# Patient Record
Sex: Male | Born: 1935 | Race: White | Hispanic: No | Smoking: Never smoker
Health system: Southern US, Community
[De-identification: ages and names within clinical notes are randomized; demographics above are authoritative.]

## PROBLEM LIST (undated history)

## (undated) DIAGNOSIS — F419 Anxiety disorder, unspecified: Secondary | ICD-10-CM

## (undated) DIAGNOSIS — F32A Depression, unspecified: Secondary | ICD-10-CM

## (undated) DIAGNOSIS — F329 Major depressive disorder, single episode, unspecified: Secondary | ICD-10-CM

## (undated) DIAGNOSIS — I10 Essential (primary) hypertension: Secondary | ICD-10-CM

## (undated) DIAGNOSIS — I2510 Atherosclerotic heart disease of native coronary artery without angina pectoris: Secondary | ICD-10-CM

## (undated) DIAGNOSIS — M1990 Unspecified osteoarthritis, unspecified site: Secondary | ICD-10-CM

## (undated) DIAGNOSIS — I5022 Chronic systolic (congestive) heart failure: Secondary | ICD-10-CM

## (undated) DIAGNOSIS — N189 Chronic kidney disease, unspecified: Secondary | ICD-10-CM

## (undated) HISTORY — PX: HERNIA REPAIR: SHX51

## (undated) HISTORY — PX: JOINT REPLACEMENT: SHX530

## (undated) HISTORY — PX: APPENDECTOMY: SHX54

---
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
## 2007-02-05 ENCOUNTER — Ambulatory Visit: Payer: Self-pay | Admitting: Gastroenterology

## 2010-08-03 ENCOUNTER — Ambulatory Visit: Payer: Self-pay | Admitting: General Practice

## 2010-08-03 DIAGNOSIS — I10 Essential (primary) hypertension: Secondary | ICD-10-CM

## 2010-08-10 ENCOUNTER — Ambulatory Visit: Payer: Self-pay | Admitting: General Practice

## 2012-02-20 ENCOUNTER — Ambulatory Visit: Payer: Self-pay | Admitting: General Practice

## 2012-02-20 LAB — CBC
HCT: 37.4 % — ABNORMAL LOW (ref 40.0–52.0)
HGB: 12.7 g/dL — ABNORMAL LOW (ref 13.0–18.0)
MCHC: 34.1 g/dL (ref 32.0–36.0)
MCV: 100 fL (ref 80–100)
RDW: 14.5 % (ref 11.5–14.5)
WBC: 9 10*3/uL (ref 3.8–10.6)

## 2012-02-20 LAB — APTT: Activated PTT: 29 secs (ref 23.6–35.9)

## 2012-02-20 LAB — BASIC METABOLIC PANEL
Anion Gap: 5 — ABNORMAL LOW (ref 7–16)
Calcium, Total: 9.6 mg/dL (ref 8.5–10.1)
Co2: 31 mmol/L (ref 21–32)
EGFR (African American): 57 — ABNORMAL LOW
EGFR (Non-African Amer.): 49 — ABNORMAL LOW
Glucose: 89 mg/dL (ref 65–99)
Osmolality: 282 (ref 275–301)

## 2012-02-20 LAB — PROTIME-INR
INR: 1
Prothrombin Time: 13.4 secs (ref 11.5–14.7)

## 2012-02-20 LAB — URINALYSIS, COMPLETE
Bilirubin,UR: NEGATIVE
Glucose,UR: NEGATIVE mg/dL (ref 0–75)
Ketone: NEGATIVE
RBC,UR: NONE SEEN /HPF (ref 0–5)
Specific Gravity: 1.013 (ref 1.003–1.030)
Squamous Epithelial: <1
WBC UR: 1 /HPF (ref 0–5)

## 2012-02-20 LAB — MRSA PCR SCREENING

## 2012-02-22 LAB — URINE CULTURE

## 2012-03-04 ENCOUNTER — Inpatient Hospital Stay: Payer: Self-pay | Admitting: General Practice

## 2012-03-05 LAB — BASIC METABOLIC PANEL
Anion Gap: 8 (ref 7–16)
BUN: 21 mg/dL — ABNORMAL HIGH (ref 7–18)
Calcium, Total: 8.4 mg/dL — ABNORMAL LOW (ref 8.5–10.1)
Chloride: 104 mmol/L (ref 98–107)
Co2: 27 mmol/L (ref 21–32)
Creatinine: 1.25 mg/dL (ref 0.60–1.30)
EGFR (African American): >60
EGFR (Non-African Amer.): 56 — ABNORMAL LOW
Osmolality: 282 (ref 275–301)
Potassium: 4.2 mmol/L (ref 3.5–5.1)

## 2012-03-05 LAB — HEMOGLOBIN: HGB: 11.2 g/dL — ABNORMAL LOW (ref 13.0–18.0)

## 2012-03-06 LAB — PLATELET COUNT: Platelet: 107 10*3/uL — ABNORMAL LOW (ref 150–440)

## 2012-03-06 LAB — BASIC METABOLIC PANEL
Anion Gap: 6 — ABNORMAL LOW (ref 7–16)
BUN: 16 mg/dL (ref 7–18)
Calcium, Total: 8.3 mg/dL — ABNORMAL LOW (ref 8.5–10.1)
Chloride: 103 mmol/L (ref 98–107)
Co2: 29 mmol/L (ref 21–32)
Creatinine: 1.29 mg/dL (ref 0.60–1.30)
EGFR (African American): >60
EGFR (Non-African Amer.): 54 — ABNORMAL LOW
Potassium: 4.3 mmol/L (ref 3.5–5.1)
Sodium: 138 mmol/L (ref 136–145)

## 2012-03-06 LAB — HEMOGLOBIN: HGB: 10.9 g/dL — ABNORMAL LOW (ref 13.0–18.0)

## 2012-03-08 ENCOUNTER — Encounter: Payer: Self-pay | Admitting: Internal Medicine

## 2012-03-19 ENCOUNTER — Encounter: Payer: Self-pay | Admitting: Internal Medicine

## 2013-08-29 IMAGING — CR DG KNEE 1-2V*R*
1 series · 2 of 2 positions shown · non-contrast
Comparison: none

REASON FOR EXAM: postop
COMMENTS:   Bedside (portable):Y

PROCEDURE:     DXR - DXR KNEE RIGHT AP AND LATERAL  - March 04, 2012  [DATE]
RESULT:     Right knee evaluation 03/04/2012.

[Series 1: ap · 0.17mm/px · 2 of 2 slices shown]
[im 1/2]
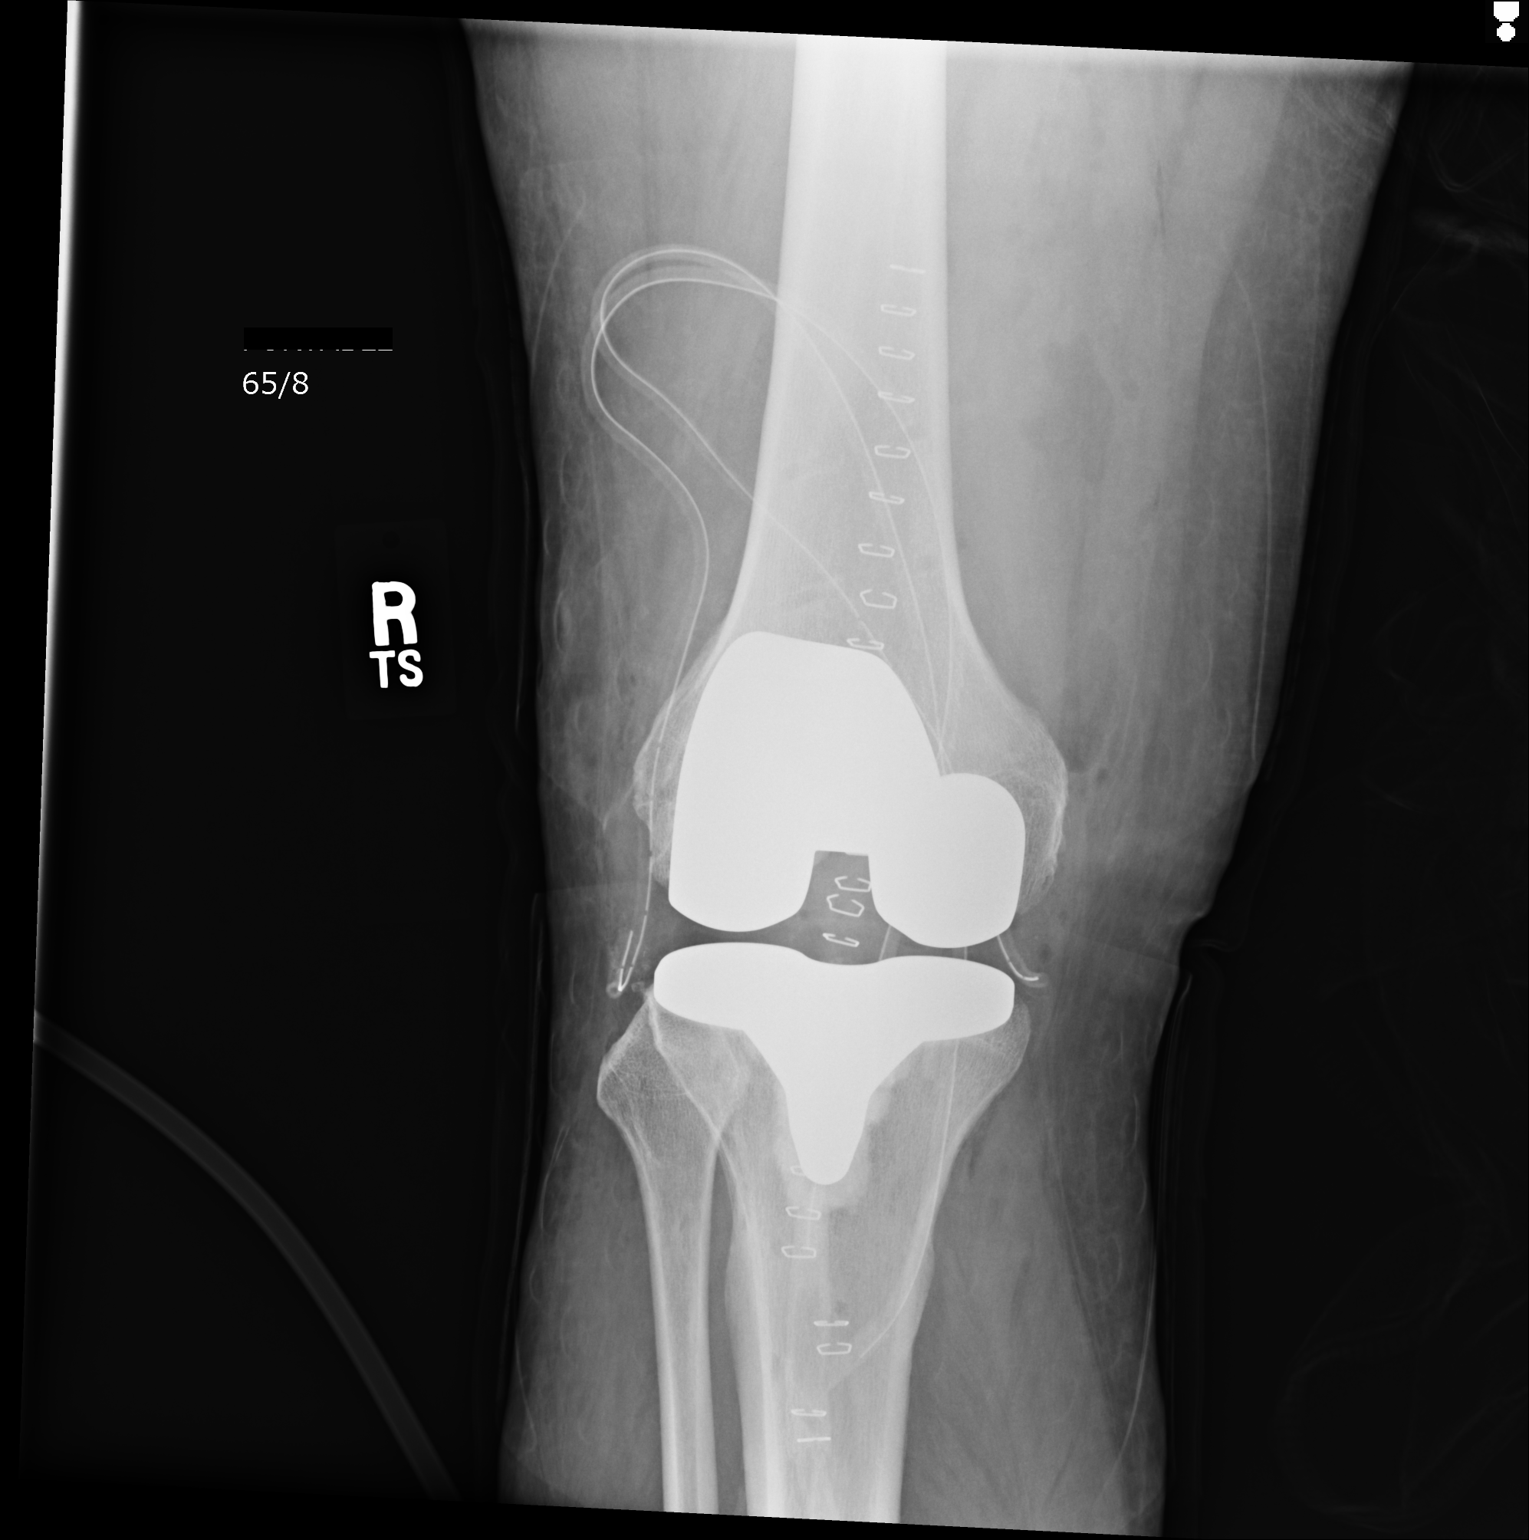
[im 2/2]
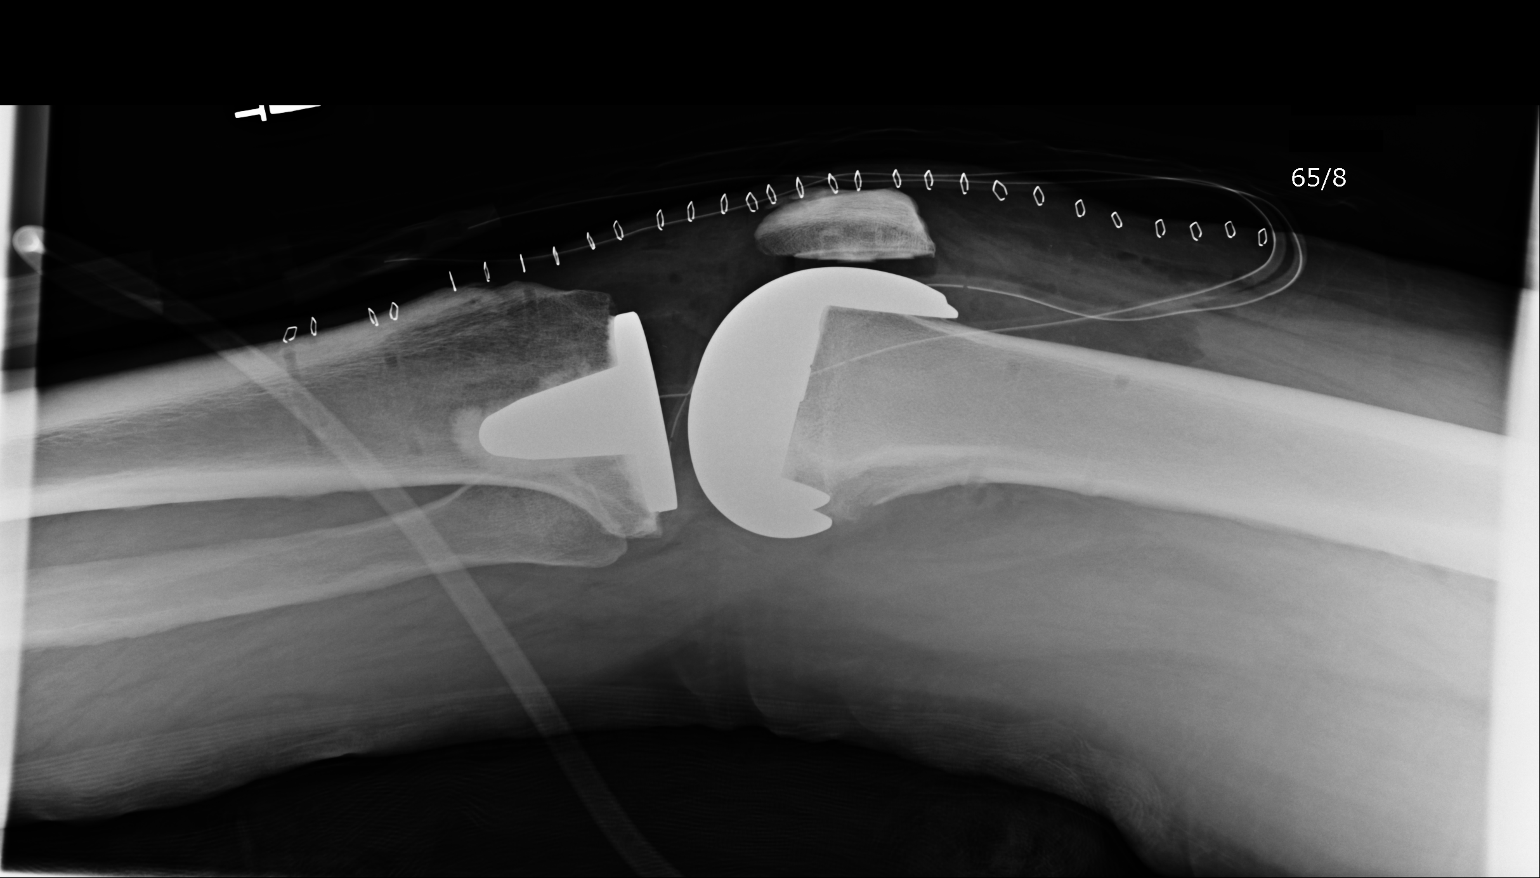

[2 of 2 positions shown; findings below may reference images not displayed]

FINDINGS: The patient status post total right knee replacement. The hardware
appears intact without evidence of loosening or failure. Skin staples
identified about the knee and surgical drains have been placed. The native
osseous structures are grossly unremarkable.
IMPRESSION: Patient status post total right knee replacement the
remainder of the interpretation on the left to the performing physician.

## 2014-01-12 DIAGNOSIS — M109 Gout, unspecified: Secondary | ICD-10-CM | POA: Insufficient documentation

## 2014-01-12 DIAGNOSIS — I10 Essential (primary) hypertension: Secondary | ICD-10-CM | POA: Insufficient documentation

## 2014-10-06 NOTE — Op Note (Signed)
PATIENT NAME:  Rutherford NailCURTIS, Jerome [name] MR#:  [id number] DATE OF BIRTH:  1935-02-21  DATE OF PROCEDURE:  03/04/2012  PREOPERATIVE DIAGNOSIS: Degenerative arthrosis of the right knee.   POSTOPERATIVE DIAGNOSIS: Degenerative arthrosis of the right knee.   PROCEDURE PERFORMED: Right total knee arthroplasty using computer-assisted navigation.   SURGEON: Illene LabradorJames P. Hooten, M.D.   ASSISTANT: Van ClinesJon Wolfe, PA-[name] (required to maintain retraction throughout the procedure)   ANESTHESIA: Spinal.   ESTIMATED BLOOD LOSS: 200 mL.   FLUIDS REPLACED: 1800 mL of crystalloid.   TOURNIQUET TIME: 100 minutes.   DRAINS: Two medium drains to reinfusion system.   SOFT TISSUE RELEASES: Anterior cruciate ligament, posterior cruciate ligament, deep medial collateral ligament, patellofemoral ligament, posterolateral corner, and pie crusting of iliotibial band.   IMPLANTS UTILIZED: DePuy PFC Sigma size 4 posterior stabilized femoral component (cemented), size 4 MBT tibial component (cemented), 38 mm three peg oval dome patella (cemented), and a 10 mm stabilized rotating platform polyethylene insert.   INDICATIONS FOR SURGERY: The patient is a 80 year old male who has been seen for complaints of progressive right knee pain. X-rays demonstrated significant degenerative changes with relative valgus deformity. After discussion of the risks and benefits of surgical intervention, the patient expressed his understanding of the risks and benefits and agreed with plans for surgical intervention.   PROCEDURE IN DETAIL: The patient was brought into the Operating Room and, after adequate spinal anesthesia was achieved, a tourniquet was placed on the patient's upper right thigh. The patient's right knee and leg were cleaned and prepped with alcohol and DuraPrep and draped in the usual sterile fashion. A "time out" was performed as per usual protocol. The right lower extremity was exsanguinated using an Esmarch, and the tourniquet was inflated  to 300 mmHg. An anterior longitudinal incision was made followed by a standard mid vastus approach. A large effusion was evacuated. The deep fibers of the medial collateral ligament were elevated in subperiosteal fashion off the medial flare of the tibia so as to maintain a continuous soft tissue sleeve. The patella was subluxed laterally and the patellofemoral ligament was incised. Inspection of the knee demonstrated significant degenerative changes in tricompartmental fashion with full thickness loss of the articular cartilage to the lateral tibial plateau. Anterior and posterior cruciate ligaments were excised. Osteophytes were debrided using a rongeur. Two 4.0 mm Schanz pins were inserted into the femur and into the tibia for attachment of the array of trackers used for computer-assisted navigation. Hip center was identified using a circumduction technique. Distal landmarks were mapped using the computer. The distal femur and proximal tibia were mapped using the computer. Distal femoral cutting guide was positioned using computer-assisted navigation so as to achieve 5 degree distal valgus cut. Cut was performed and verified using the computer. The distal femur was sized and it was felt that a size 4 femoral component was appropriate. A size 4 cutting guide was positioned and the anterior cut was performed and verified using the computer. This was followed by completion of the posterior and chamfer cuts. Femoral cutting guide for the central box was then positioned and the central box cut was performed.   Attention was then directed to the proximal tibia. Medial and lateral menisci were excised. The extramedullary tibial cutting guide was positioned using computer-assisted navigation so as to achieve 0 degree varus valgus alignment and 0 degree posterior slope. Cut was performed and verified using the computer. The proximal tibia was sized and it was felt that a size 4 tibial tray  was appropriate. Tibial and  femoral trials were inserted followed by insertion of a 10 mm polyethylene insert. The knee was felt to be tight laterally. Polyethylene trial was removed. Posterior osteophytes were debrided from the femoral condyles. Femoral and tibial trials were removed and the knee was placed in extension and distracted using the Moreland retractors. The posterolateral corner was noted be tight and it was carefully released using a combination of electrocautery and Metzenbaum scissors. Trial reduction was again performed with a 10 mm polyethylene insert. IT band was noted to be still tight and pie crusting was performed using a scalpel. This allowed for excellent mediolateral soft tissue balancing. Finally, the patella was cut and prepared so as to accommodate a 38 mm three peg oval dome patella. Patellar trial was placed and the knee was placed through a range of motion with excellent patellar tracking appreciated.   Femoral trial was removed. Central post hole for the tibial component was reamed followed by insertion of a keel punch. Tibial trial was then removed. The cut surfaces of bone were irrigated with copious amounts of normal saline with antibiotic solution using pulsatile lavage and then suctioned dry. Polymethyl methacrylate cement with gentamicin was mixed in the usual fashion using a vacuum mixer. Cement was applied to the cut surface of the proximal tibia as well as along the undersurface of a size 4 MBT tibial component. The tibial component was positioned and impacted into place. Excess cement was removed using freer elevators. Cement was then applied to the cut surfaces of the femur as well as along the posterior flanges of a size 4 posterior stabilized femoral component. Femoral component was positioned and impacted into place. Excess cement was removed using freer elevators. A 10 mm polyethylene trial was inserted and the knee was brought into full extension with steady axial compression applied. Finally,  cement was applied to the backside of a 38 mm three peg oval dome patella and the patellar component was positioned and patellar clamp applied. Excess cement was removed using freer elevators.   After adequate curing of cement, the tourniquet was deflated after total tourniquet time of 100 minutes. Hemostasis was achieved using electrocautery. The knee was irrigated with copious amounts of normal saline with antibiotic solution using pulsatile lavage and then suctioned dry. The knee was inspected for any residual cement debris. 30 mL of 0.25% Marcaine with epinephrine was injected along the posterior capsule. A 10 mm stabilized rotating platform polyethylene insert was inserted and the knee was placed through a range of motion. Excellent patellar tracking was appreciated and excellent mediolateral soft tissue balancing was appreciated both in extension and in flexion.   Two medium drains were placed in the wound bed and brought out through a separate stab incision to be attached to a reinfusion system. The medial parapatellar portion of the incision was reapproximated using interrupted sutures of #1 Vicryl. The subcutaneous tissue was approximated in layers using first #0 Vicryl followed by #2-0 Vicryl. Skin was closed with skin staples. Sterile dressing was applied.   The patient tolerated the procedure well. He was transported to the recovery room in stable condition. ____________________________ Illene Labrador. Angie Fava., MD jph:slb D: 03/04/2012 20:51:06 ET     T: 03/05/2012 10:43:38 ET        JOB#: [id number] cc: Illene Labrador. Angie Fava., MD, <Dictator> JAMES P Angie Fava MD ELECTRONICALLY SIGNED 03/05/2012 20:18

## 2014-10-06 NOTE — Discharge Summary (Signed)
PATIENT NAME:  Jerome [name], Valery C MR#:  [id number] DATE OF BIRTH:  1935-02-11  DATE OF ADMISSION:  03/04/2012 DATE OF DISCHARGE:  03/07/2012  ADMITTING DIAGNOSIS: Degenerative arthrosis of right knee.   DISCHARGE DIAGNOSIS: Degenerative arthrosis of right knee.   HISTORY: Patient is a 80 year old male who [facility] for discomfort to the right knee. The patient had reported a long history of progressive right knee pain that was aggravated with weight-bearing activities. The patient had localized most of the pain along the lateral aspect of the knee. He had reported some occasional swelling of the knee as well as near giving way. Patient had denied any gross locking of the knee. On occasion the patient was noted to use a cane for ambulation. He had not seen any significant improvement in his discomfort despite the use of Tylenol, glucosamine and Synvisc injections. The pain had increased to the point that it was significantly interfering with his activities of daily living. [facility] showed narrowing of the lateral cartilage space with associated valgus alignment. Osteophyte as well as subchondral sclerosis was noted. There was no evidence of any fractures or dislocation. After discussion of the risks and benefits of surgical intervention, patient expressed his understanding of the risks and benefits and agreed for plans for surgical intervention.   PROCEDURE: Right total knee arthroplasty using computer-assisted navigation.   ANESTHESIA: Spinal.   SOFT TISSUE RELEASE: Anterior cruciate ligament, posterior cruciate ligament, deep medial collateral ligament, patellofemoral ligament, posterolateral corner, and pie crusting of the iliotibial band.   IMPLANTS UTILIZED: DePuy PFC Sigma size 4 posterior stabilized femoral component (cemented), size 4 MBT tibial component (cemented), 38 mm three pegged oval dome patella (cemented), and a 10  mm stabilized rotating platform polyethylene insert.   HOSPITAL COURSE: Patient tolerated the procedure very well. He had no complications. However, during the procedure he was noted to have a three minute episode of bigeminy PVC. This cleared quickly and had no other arrhythmias or problems during the surgery or postoperatively. He was then taken to the PAC-U where he was stabilized then transferred to the orthopedic floor. He began receiving anticoagulation therapy of Lovenox 30 mg sub-Q every 12 hours per anesthesia and pharmacy protocol. He was fitted with TED stockings bilaterally. These were allowed to be removed one hour per eight hour shift. The right one was applied on day two following removal of the Hemovac and dressing change. He was also fitted with the AV-I compression foot pumps bilaterally set at 80 mmHg. His calves have been nontender. He has voiced no complaints of discomfort to the calf region. No evidence of any deep venous thromboses. Homans signs are negative.   Patient has denied any chest pain or any shortness of breath. Vital signs have been stable. He has been afebrile. Did have a little low-grade temp on night two at 100. Otherwise he has been asymptomatic. His blood work was within acceptable limits. No transfusions were given.   Physical therapy was initiated on day one for gait training and transfers. He has done very well. Upon being transferred was ambulating 115 feet. Had range of motion of -4 to 86 degrees. Occupational therapy was also initiated on day one for activities of daily living and assistive devices.   Patient's IV, Foley and Hemovac were discontinued on day two along with a dressing change. The wound was free of any drainage or signs of infection. The Polar Care was reapplied to the  surgical leg maintaining a temperature of 40 to 50 degrees Fahrenheit.   DISPOSITION: Patient is being discharged to skilled nursing facility in improved stable condition.    DISCHARGE INSTRUCTIONS:  1. He may weight bear as tolerated.  2. Continue TED stockings. These are allowed to be removed one hour per eight hour shift.  3. Incentive spirometer to be used q.1 hour while awake.  4. Encourage cough, deep breathing every two hours while awake.  5. Polar Care to the surgical leg maintaining a temperature of 40 to 50 degrees Fahrenheit.  6. Physical therapy for gait training and transfers. Occupational therapy for activities of daily living and assistive devices.  7. He is placed on a regular diet.  8. His heels are to be elevated.  9. He has a follow-up appointment with Van Clines, PA on 10/01 at 8:45 and Dr. Francesco Sor 10/31 at 10:45. He is to call the clinic sooner if any temperatures of 101.5 or greater or excessive bleeding.   DRUG ALLERGIES: No known drug allergies.   MEDICATIONS:  1. Tylenol ES 500 to 1000 mg every four hours p.r.n.  2. Roxicodone 5 to 10 mg every four hours p.r.n.  3. Tramadol 50 to 100 mg every four hours p.r.n.  4. Dulcolax suppositories 10 mg rectally daily p.r.n. for constipation.  5. Milk of magnesia 30 mL b.i.d. p.r.n.  6. Enema soapsuds if no results with milk of magnesia or Dulcolax p.r.n.  7. Mylanta DS 30 mL q.6 hours p.r.n.  8. Pantoprazole 40 mg b.i.d.  9. Senokot-S 1 tablet twice a day.  10. Hydrochlorothiazide/lisinopril 25/20, 1 tablet daily. 11. Alprenolol 300 mg daily.  12. One multivitamin capsule daily.  13. Nystatin topical powder apply to affected area b.i.d.  14. Lovenox 40 mg sub-Q q.12 hours for 14 days, then discontinue and begin taking one 81 mg enteric-coated aspirin.   PAST MEDICAL HISTORY:  1. Hypertension.  2. Kidney stones. 3. Hernia.  4. Gout.  5. Mitral regurgitation. 6. Mild renal insufficiency. 7. Colonic polyps.  8. Morbid obesity.   ____________________________ Van Clines, PA jrw:cms D: 03/07/2012 07:00:05 ET T: 03/07/2012 07:31:40 ET JOB#: [id number]  cc: Van Clines, PA,  <Dictator> Imraan Wendell PA ELECTRONICALLY SIGNED 03/08/2012 8:05

## 2018-12-08 ENCOUNTER — Inpatient Hospital Stay
Admission: EM | Admit: 2018-12-08 | Discharge: 2018-12-12 | DRG: 291 | Disposition: A | Discharge status: Skilled Nursing Facility | Payer: Medicare Other | Attending: Internal Medicine | Admitting: Internal Medicine

## 2018-12-08 ENCOUNTER — Emergency Department: Payer: Medicare Other

## 2018-12-08 ENCOUNTER — Other Ambulatory Visit: Payer: Self-pay

## 2018-12-08 ENCOUNTER — Encounter: Payer: Self-pay | Admitting: *Deleted

## 2018-12-08 DIAGNOSIS — Z91128 Patient's intentional underdosing of medication regimen for other reason: Secondary | ICD-10-CM

## 2018-12-08 DIAGNOSIS — R609 Edema, unspecified: Secondary | ICD-10-CM | POA: Diagnosis not present

## 2018-12-08 DIAGNOSIS — I130 Hypertensive heart and chronic kidney disease with heart failure and stage 1 through stage 4 chronic kidney disease, or unspecified chronic kidney disease: Principal | ICD-10-CM | POA: Diagnosis present

## 2018-12-08 DIAGNOSIS — Z1159 Encounter for screening for other viral diseases: Secondary | ICD-10-CM

## 2018-12-08 DIAGNOSIS — I340 Nonrheumatic mitral (valve) insufficiency: Secondary | ICD-10-CM | POA: Diagnosis present

## 2018-12-08 DIAGNOSIS — W19XXXA Unspecified fall, initial encounter: Secondary | ICD-10-CM | POA: Diagnosis present

## 2018-12-08 DIAGNOSIS — I5023 Acute on chronic systolic (congestive) heart failure: Secondary | ICD-10-CM | POA: Diagnosis present

## 2018-12-08 DIAGNOSIS — M170 Bilateral primary osteoarthritis of knee: Secondary | ICD-10-CM | POA: Diagnosis present

## 2018-12-08 DIAGNOSIS — I248 Other forms of acute ischemic heart disease: Secondary | ICD-10-CM | POA: Diagnosis present

## 2018-12-08 DIAGNOSIS — T461X6A Underdosing of calcium-channel blockers, initial encounter: Secondary | ICD-10-CM | POA: Diagnosis present

## 2018-12-08 DIAGNOSIS — R0602 Shortness of breath: Secondary | ICD-10-CM

## 2018-12-08 DIAGNOSIS — S51011A Laceration without foreign body of right elbow, initial encounter: Secondary | ICD-10-CM | POA: Diagnosis present

## 2018-12-08 DIAGNOSIS — I509 Heart failure, unspecified: Secondary | ICD-10-CM

## 2018-12-08 DIAGNOSIS — N183 Chronic kidney disease, stage 3 (moderate): Secondary | ICD-10-CM | POA: Diagnosis present

## 2018-12-08 HISTORY — DX: Essential (primary) hypertension: I10

## 2018-12-08 MED ORDER — SODIUM CHLORIDE 0.9% FLUSH: 3.0000 mL | Freq: Once | INTRAVENOUS | Status: DC

## 2018-12-08 NOTE — ED Triage Notes (Signed)
Pt to ED from home after a fall tonight. Pt has +3 pitting edema to both lower extremities and scrotal edema. Son reported to EMS dark colored urine recently as well with a hx of prostate problems.   Skin tear to right elbow and chronic right knee pain that pt verbalized upon arrival.

## 2018-12-08 NOTE — ED Provider Notes (Signed)
[facility] Emergency Department Provider Note   ____________________________________________   First MD Initiated Contact with Patient 12/08/18 2323     (approximate)  I have reviewed the triage vital signs and the nursing notes.   HISTORY  Chief Complaint Leg Swelling and Fall    HPI Jerome Lafavor Archibald Marchetta. is a 84 y.o. male brought to the ED from home via EMS with a chief complaint of fall and edema.  Patient reports increased swelling to both lower extremities as well as scrotum for "quite some time".  [place] [address] tonight because he has difficulty ambulating on his swollen legs and feet.  Did not strike his head or suffer LOC.  Presents with a skin tear to his right elbow which is not bleeding.  States he does not take his amlodipine all the time because it makes him urinate more frequently.  Other than that, only takes a baby aspirin and Flomax.  Denies recent fever, cough, chest pain, abdominal pain, nausea or vomiting.  Complains of dyspnea on exertion.  Denies recent travel or exposure to persons diagnosed with coronavirus.       Past Medical History:  Diagnosis Date  . Hypertension   . History of colon polyps  . Hypertension  . Mild mitral regurgitation  . Mild renal insufficiency  . Morbid obesity (CMS-HCC)  . Varicosities of leg    There are no active problems to display for this patient.   History reviewed. No pertinent surgical history.  Prior to Admission medications   Not on File    Allergies Patient has no allergy information on record.  History reviewed. No pertinent family history.  Social History Social History   Tobacco Use  . Smoking status: Never Smoker  . Smokeless tobacco: Never Used  Substance Use Topics  . Alcohol use: Never    Frequency: Never  . Drug use: Never    Review of Systems  Constitutional: Positive for fall.  No fever/chills Eyes: No visual changes. ENT: No sore throat. Cardiovascular: Denies  chest pain. Respiratory: Positive for shortness of breath. Gastrointestinal: No abdominal pain.  No nausea, no vomiting.  No diarrhea.  No constipation. Genitourinary: Positive for scrotal swelling.  Negative for dysuria. Musculoskeletal: Positive for BLE swelling.  Negative for back pain. Skin: Negative for rash. Neurological: Negative for headaches, focal weakness or numbness.   ____________________________________________   PHYSICAL EXAM:  VITAL SIGNS: ED Triage Vitals  Enc Vitals Group     BP 12/08/18 2237 (!) 185/81     Pulse Rate 12/08/18 2237 79     Resp 12/08/18 2237 (!) 21     Temp 12/08/18 2237 98.3 F (36.8 C)     Temp Source 12/08/18 2237 Oral     SpO2 12/08/18 2237 100 %     Weight --      Height --      Head Circumference --      Peak Flow --      Pain Score 12/08/18 2238 10     Pain Loc --      Pain Edu? --      Excl. in [place]? --     Constitutional: Alert and oriented.  Chronically ill appearing and in mild acute distress. Eyes: Conjunctivae are normal. PERRL. EOMI. Head: Atraumatic. Nose: No congestion/rhinnorhea. Mouth/Throat: Mucous membranes are moist.  Oropharynx non-erythematous. Neck: No stridor.   Cardiovascular: Normal rate, regular rhythm. Grossly normal heart sounds.  Good peripheral circulation. Respiratory: Increased respiratory effort.  No retractions.  Lungs with bibasilar rales. Gastrointestinal: Soft and nontender. No distention. No abdominal bruits. No CVA tenderness. Genitourinary: Edematous scrotum. Musculoskeletal: Small skin tear to right elbow.  Full range of motion without pain.  3+ BLE pitting edema.  No joint effusions. Neurologic:  Normal speech and language. No gross focal neurologic deficits are appreciated.  Skin:  Skin is warm, dry and intact. No rash noted. Psychiatric: Mood and affect are normal. Speech and behavior are normal.  ____________________________________________   LABS (all labs ordered are listed, but only  abnormal results are displayed)  Labs Reviewed  BASIC METABOLIC PANEL - Abnormal; Notable for the following components:      Result Value   Creatinine, Ser 1.26 (*)    GFR calc non Af Amer 53 (*)    All other components within normal limits  BRAIN NATRIURETIC PEPTIDE - Abnormal; Notable for the following components:   B Natriuretic Peptide 1,581.0 (*)    All other components within normal limits  CBC - Abnormal; Notable for the following components:   RBC 4.09 (*)    Hemoglobin 12.6 (*)    RDW 15.7 (*)    All other components within normal limits  TROPONIN I - Abnormal; Notable for the following components:   Troponin I 0.06 (*)    All other components within normal limits  SARS CORONAVIRUS 2 (HOSPITAL ORDER, PERFORMED IN [facility])   ____________________________________________  EKG  ED ECG REPORT I, Toneka Fullen J, the attending physician, personally viewed and interpreted this ECG.   Date: 12/09/2018  EKG Time: 2237  Rate: 79  Rhythm: normal EKG, normal sinus rhythm  Axis: Normal  Intervals:right bundle branch block  ST&T Change: Nonspecific  ____________________________________________  RADIOLOGY  ED MD interpretation: Cardiomegaly with vascular congestion  Official radiology report(s): Dg Chest Port 1 View  Result Date: 12/08/2018 CLINICAL DATA:  Shortness of breath. Fall. EXAM: PORTABLE CHEST 1 VIEW COMPARISON:  None. FINDINGS: Mild cardiomegaly with normal mediastinal contours. Vascular congestion. Suspect small pleural effusions. No focal airspace disease. No pneumothorax. Chronic degenerative change of both shoulders. No visualized rib fracture. IMPRESSION: Mild cardiomegaly with vascular congestion. Suspect small pleural effusions. Findings suspicious for fluid overload/mild CHF. Electronically Signed   By: Narda RutherfordMelanie  Sanford M.D.   On: 12/08/2018 23:03    ____________________________________________   PROCEDURES  Procedure(s) performed  (including Critical Care):  Procedures   ____________________________________________   INITIAL IMPRESSION / ASSESSMENT AND PLAN / ED COURSE  As part of my medical decision making, I reviewed the following data within the electronic MEDICAL RECORD NUMBER Nursing notes reviewed and incorporated, Labs reviewed, EKG interpreted, Old chart reviewed, Radiograph reviewed, Discussed with admitting NP Marylene LandAngela and Notes from prior ED visits     Jerome ArnoldGordon Craig Letta MedianCurtis Jr. was evaluated in Emergency Department on 12/09/2018 for the symptoms described in the history of present illness. He was evaluated in the context of the global COVID-19 pandemic, which necessitated consideration that the patient might be at risk for infection with the SARS-CoV-2 virus that causes COVID-19. Institutional protocols and algorithms that pertain to the evaluation of patients at risk for COVID-19 are in a state of rapid change based on information released by regulatory bodies including the CDC and federal and state organizations. These policies and algorithms were followed during the patient's care in the ED.   84 year old male who presents with peripheral and scrotal edema, dyspnea on exertion. Differential includes, but is not limited to, viral syndrome, bronchitis including COPD exacerbation, pneumonia, reactive airway disease  including asthma, CHF including exacerbation with or without pulmonary/interstitial edema, pneumothorax, ACS, thoracic trauma, and pulmonary embolism.  Laboratory results noted.  Will administer 1 mg IV Bumex for diuresis.  Discussed with hospitalist NP Marylene LandAngela to evaluate patient in the emergency department for admission.      ____________________________________________   FINAL CLINICAL IMPRESSION(S) / ED DIAGNOSES  Final diagnoses:  Peripheral edema  Fall, initial encounter  Congestive heart failure, unspecified HF chronicity, unspecified heart failure type [facility](HCC)     ED Discharge Orders    None        Note:  This document was prepared using Dragon voice recognition software and may include unintentional dictation errors.   Irean HongSung, Jovante Hammitt J, MD 12/09/18 [phone] [id number]

## 2018-12-09 ENCOUNTER — Inpatient Hospital Stay (HOSPITAL_COMMUNITY)
Admit: 2018-12-09 | Discharge: 2018-12-09 | Disposition: A | Discharge status: Home / Self Care | Payer: Medicare Other | Attending: Nurse Practitioner | Admitting: Nurse Practitioner

## 2018-12-09 DIAGNOSIS — I509 Heart failure, unspecified: Secondary | ICD-10-CM

## 2018-12-09 DIAGNOSIS — I371 Nonrheumatic pulmonary valve insufficiency: Secondary | ICD-10-CM | POA: Diagnosis not present

## 2018-12-09 DIAGNOSIS — I340 Nonrheumatic mitral (valve) insufficiency: Secondary | ICD-10-CM | POA: Diagnosis present

## 2018-12-09 DIAGNOSIS — Z1159 Encounter for screening for other viral diseases: Secondary | ICD-10-CM | POA: Diagnosis not present

## 2018-12-09 DIAGNOSIS — Z91128 Patient's intentional underdosing of medication regimen for other reason: Secondary | ICD-10-CM | POA: Diagnosis not present

## 2018-12-09 DIAGNOSIS — W19XXXA Unspecified fall, initial encounter: Secondary | ICD-10-CM | POA: Diagnosis present

## 2018-12-09 DIAGNOSIS — N183 Chronic kidney disease, stage 3 (moderate): Secondary | ICD-10-CM | POA: Diagnosis present

## 2018-12-09 DIAGNOSIS — M170 Bilateral primary osteoarthritis of knee: Secondary | ICD-10-CM | POA: Diagnosis present

## 2018-12-09 DIAGNOSIS — I130 Hypertensive heart and chronic kidney disease with heart failure and stage 1 through stage 4 chronic kidney disease, or unspecified chronic kidney disease: Secondary | ICD-10-CM | POA: Diagnosis present

## 2018-12-09 DIAGNOSIS — I5023 Acute on chronic systolic (congestive) heart failure: Secondary | ICD-10-CM | POA: Diagnosis present

## 2018-12-09 DIAGNOSIS — T461X6A Underdosing of calcium-channel blockers, initial encounter: Secondary | ICD-10-CM | POA: Diagnosis present

## 2018-12-09 DIAGNOSIS — I248 Other forms of acute ischemic heart disease: Secondary | ICD-10-CM | POA: Diagnosis present

## 2018-12-09 DIAGNOSIS — R609 Edema, unspecified: Secondary | ICD-10-CM | POA: Diagnosis present

## 2018-12-09 DIAGNOSIS — S51011A Laceration without foreign body of right elbow, initial encounter: Secondary | ICD-10-CM | POA: Diagnosis present

## 2018-12-09 LAB — TROPONIN I
Troponin I: 0.06 ng/mL (ref <0.03)
Troponin I: 0.07 ng/mL (ref <0.03)
Troponin I: 0.07 ng/mL (ref <0.03)
Troponin I: 0.08 ng/mL (ref <0.03)

## 2018-12-09 LAB — CBC
HCT: 40 % (ref 39.0–52.0)
HCT: 41.6 % (ref 39.0–52.0)
Hemoglobin: 12.6 g/dL — ABNORMAL LOW (ref 13.0–17.0)
Hemoglobin: 13.4 g/dL (ref 13.0–17.0)
MCH: 30.8 pg (ref 26.0–34.0)
MCH: 31.2 pg (ref 26.0–34.0)
MCHC: 31.5 g/dL (ref 30.0–36.0)
MCHC: 32.2 g/dL (ref 30.0–36.0)
MCV: 97 fL (ref 80.0–100.0)
MCV: 97.8 fL (ref 80.0–100.0)
Platelets: 144 10*3/uL — ABNORMAL LOW (ref 150–400)
Platelets: 155 10*3/uL (ref 150–400)
RBC: 4.09 MIL/uL — ABNORMAL LOW (ref 4.22–5.81)
RBC: 4.29 MIL/uL (ref 4.22–5.81)
RDW: 15.7 % — ABNORMAL HIGH (ref 11.5–15.5)
RDW: 15.9 % — ABNORMAL HIGH (ref 11.5–15.5)
WBC: 8.3 10*3/uL (ref 4.0–10.5)
WBC: 8.8 10*3/uL (ref 4.0–10.5)
nRBC: 0 % (ref 0.0–0.2)
nRBC: 0 % (ref 0.0–0.2)

## 2018-12-09 LAB — ECHOCARDIOGRAM COMPLETE
Height: 67.992 in
Weight: 4017.66 oz

## 2018-12-09 LAB — BASIC METABOLIC PANEL
Anion gap: 9 (ref 5–15)
BUN: 19 mg/dL (ref 8–23)
CO2: 27 mmol/L (ref 22–32)
Calcium: 9 mg/dL (ref 8.9–10.3)
Chloride: 105 mmol/L (ref 98–111)
Creatinine, Ser: 1.26 mg/dL — ABNORMAL HIGH (ref 0.61–1.24)
GFR calc Af Amer: >60 mL/min (ref >60)
GFR calc non Af Amer: 53 mL/min — ABNORMAL LOW (ref >60)
Glucose, Bld: 92 mg/dL (ref 70–99)
Potassium: 4.1 mmol/L (ref 3.5–5.1)
Sodium: 141 mmol/L (ref 135–145)

## 2018-12-09 LAB — SARS CORONAVIRUS 2 BY RT PCR (HOSPITAL ORDER, PERFORMED IN ~~LOC~~ HOSPITAL LAB): SARS Coronavirus 2: NEGATIVE

## 2018-12-09 LAB — CREATININE, SERUM
Creatinine, Ser: 1.19 mg/dL (ref 0.61–1.24)
GFR calc Af Amer: >60 mL/min (ref >60)
GFR calc non Af Amer: 57 mL/min — ABNORMAL LOW (ref >60)

## 2018-12-09 LAB — BRAIN NATRIURETIC PEPTIDE: B Natriuretic Peptide: 1581 pg/mL — ABNORMAL HIGH (ref 0.0–100.0)

## 2018-12-09 MED ORDER — SODIUM CHLORIDE 0.9% FLUSH: 3.0000 mL | INTRAVENOUS | Status: DC | PRN

## 2018-12-09 MED ORDER — ENOXAPARIN SODIUM 40 MG/0.4ML ~~LOC~~ SOLN
40.0000 mg | SUBCUTANEOUS | Status: DC
  Administered 2018-12-09 – 2018-12-12 (×4): 40 mg via SUBCUTANEOUS
  Filled 2018-12-09 (×4): qty 0.4

## 2018-12-09 MED ORDER — ASPIRIN 81 MG PO CHEW
324.0000 mg | CHEWABLE_TABLET | Freq: Once | ORAL | Status: AC
  Administered 2018-12-09: 01:00:00 324 mg via ORAL
  Filled 2018-12-09: qty 4

## 2018-12-09 MED ORDER — ACETAMINOPHEN 325 MG PO TABS
650.0000 mg | ORAL_TABLET | ORAL | Status: DC | PRN
  Administered 2018-12-11: 650 mg via ORAL
  Filled 2018-12-09: qty 2

## 2018-12-09 MED ORDER — SODIUM CHLORIDE 0.9% FLUSH
3.0000 mL | Freq: Two times a day (BID) | INTRAVENOUS | Status: DC
  Administered 2018-12-09 – 2018-12-12 (×7): 3 mL via INTRAVENOUS

## 2018-12-09 MED ORDER — ATENOLOL 50 MG PO TABS
50.0000 mg | ORAL_TABLET | Freq: Every day | ORAL | Status: DC
  Administered 2018-12-09: 50 mg via ORAL
  Filled 2018-12-09: qty 1
  Filled 2018-12-09: qty 2

## 2018-12-09 MED ORDER — TAMSULOSIN HCL 0.4 MG PO CAPS
0.4000 mg | ORAL_CAPSULE | Freq: Every day | ORAL | Status: DC
  Administered 2018-12-09 – 2018-12-12 (×4): 0.4 mg via ORAL
  Filled 2018-12-09 (×4): qty 1

## 2018-12-09 MED ORDER — ONDANSETRON HCL 4 MG/2ML IJ SOLN: 4.0000 mg | Freq: Four times a day (QID) | INTRAMUSCULAR | Status: DC | PRN

## 2018-12-09 MED ORDER — BUMETANIDE 0.25 MG/ML IJ SOLN
1.0000 mg | Freq: Once | INTRAMUSCULAR | Status: AC
  Administered 2018-12-09: 1 mg via INTRAVENOUS
  Filled 2018-12-09: qty 4

## 2018-12-09 MED ORDER — FUROSEMIDE 10 MG/ML IJ SOLN: 40.0000 mg | Freq: Two times a day (BID) | INTRAMUSCULAR | Status: DC

## 2018-12-09 MED ORDER — HYDRALAZINE HCL 20 MG/ML IJ SOLN
INTRAMUSCULAR | Status: AC
  Filled 2018-12-09: qty 1

## 2018-12-09 MED ORDER — HYDRALAZINE HCL 20 MG/ML IJ SOLN
10.0000 mg | Freq: Once | INTRAMUSCULAR | Status: AC
  Administered 2018-12-09: 10 mg via INTRAVENOUS

## 2018-12-09 MED ORDER — SODIUM CHLORIDE 0.9 % IV SOLN: 250.0000 mL | INTRAVENOUS | Status: DC | PRN

## 2018-12-09 NOTE — ED Notes (Signed)
AAOx3.  Skin warm and dry.  NAD 

## 2018-12-09 NOTE — ED Notes (Signed)
ED TO INPATIENT HANDOFF REPORT  ED Nurse Name and Phone #: Shanda BumpsJessica [phone]  S Name/Age/Gender Jerome EssexGordon Craig Istre Jr. 84 y.o. male Room/Bed: ED08A/ED08A  Code Status   Code Status: Full Code  Home/SNF/Other Home Patient oriented to: self, place, time and situation Is this baseline? Yes   Triage Complete: Triage complete  Chief Complaint Edema to lower extremities  Triage Note Pt to ED from home after a fall tonight. Pt has +3 pitting edema to both lower extremities and scrotal edema. Son reported to EMS dark colored urine recently as well with a hx of prostate problems.   Skin tear to right elbow and chronic right knee pain that pt verbalized upon arrival.     Allergies No Known Allergies  Level of Care/Admitting Diagnosis ED Disposition    ED Disposition Condition Comment   Admit  Hospital Area: [facility] [[id number]]  Level of Care: Med-Surg [16]  Covid Evaluation: Screening Protocol (No Symptoms)  Diagnosis: Acute exacerbation of CHF (congestive heart failure) [facility](HCC) [[id number]][[id number]]  Admitting Physician: Pearletha AlfredSEALS, ANGELA H [[id number]]  Attending Physician: Pearletha AlfredSEALS, ANGELA H [[id number]]  Estimated length of stay: 3 - 4 days  Certification:: I certify this patient will need inpatient services for at least 2 midnights  PT Class (Do Not Modify): Inpatient [101]  PT Acc Code (Do Not Modify): Private [1]       B Medical/Surgery History Past Medical History:  Diagnosis Date  . Hypertension    History reviewed. No pertinent surgical history.   A IV Location/Drains/Wounds Patient Lines/Drains/Airways Status   Active Line/Drains/Airways    Name:   Placement date:   Placement time:   Site:   Days:   Peripheral IV 12/08/18 Right;Other (Comment) Arm   12/08/18    2230    Arm   1          Intake/Output Last 24 hours  Intake/Output Summary (Last 24 hours) at 12/09/2018 1354 Last data filed at 12/09/2018 1132 Gross per 24 hour  Intake -  Output 5075 ml   Net -5075 ml    Labs/Imaging Results for orders placed or performed during the hospital encounter of 12/08/18 (from the past 48 hour(s))  SARS Coronavirus 2 (CEPHEID - Performed in [facility] hospital lab), Hosp Order     Status: None   Collection Time: 12/08/18 11:11 PM   Specimen: Nasopharyngeal Swab  Result Value Ref Range   SARS Coronavirus 2 NEGATIVE NEGATIVE    Comment: (NOTE) If result is NEGATIVE SARS-CoV-2 target nucleic acids are NOT DETECTED. The SARS-CoV-2 RNA is generally detectable in upper and lower  respiratory specimens during the acute phase of infection. The lowest  concentration of SARS-CoV-2 viral copies this assay can detect is 250  copies / mL. A negative result does not preclude SARS-CoV-2 infection  and should not be used as the sole basis for treatment or other  patient management decisions.  A negative result may occur with  improper specimen collection / handling, submission of specimen other  than nasopharyngeal swab, presence of viral mutation(s) within the  areas targeted by this assay, and inadequate number of viral copies  (<250 copies / mL). A negative result must be combined with clinical  observations, patient history, and epidemiological information. If result is POSITIVE SARS-CoV-2 target nucleic acids are DETECTED. The SARS-CoV-2 RNA is generally detectable in upper and lower  respiratory specimens dur ing the acute phase of infection.  Positive  results are indicative of active infection with SARS-CoV-2.  Clinical  correlation with patient history and other diagnostic information is  necessary to determine patient infection status.  Positive results do  not rule out bacterial infection or co-infection with other viruses. If result is PRESUMPTIVE POSTIVE SARS-CoV-2 nucleic acids MAY BE PRESENT.   A presumptive positive result was obtained on the submitted specimen  and confirmed on repeat testing.  While 2019 novel coronavirus   (SARS-CoV-2) nucleic acids may be present in the submitted sample  additional confirmatory testing may be necessary for epidemiological  and / or clinical management purposes  to differentiate between  SARS-CoV-2 and other Sarbecovirus currently known to infect humans.  If clinically indicated additional testing with an alternate test  methodology [phone]) is advised. The SARS-CoV-2 RNA is generally  detectable in upper and lower respiratory sp ecimens during the acute  phase of infection. The expected result is Negative. Fact Sheet for Patients:  StrictlyIdeas.no Fact Sheet for Healthcare Providers: BankingDealers.co.za This test is not yet approved or cleared by the [place] FDA and has been authorized for detection and/or diagnosis of SARS-CoV-2 by FDA under an Emergency Use Authorization (EUA).  This EUA will remain in effect (meaning this test can be used) for the duration of the COVID-19 declaration under Section 564(b)(1) of the Act, 21 U.S.C. section 360bbb-3(b)(1), unless the authorization is terminated or revoked sooner. Performed at [facility], [address] [place], [place] [address]   Basic metabolic panel     Status: Abnormal   Collection Time: 12/08/18 11:40 PM  Result Value Ref Range   Sodium 141 135 - 145 mmol/L   Potassium 4.1 3.5 - 5.1 mmol/L   Chloride 105 98 - 111 mmol/L   CO2 27 22 - 32 mmol/L   Glucose, Bld 92 70 - 99 mg/dL   BUN 19 8 - 23 mg/dL   Creatinine, Ser 1.26 (H) 0.61 - 1.24 mg/dL   Calcium 9.0 8.9 - 10.3 mg/dL   GFR calc non Af Amer 53 (L) >60 mL/min   GFR calc Af Amer >60 >60 mL/min   Anion gap 9 5 - 15    Comment: Performed at [facility], [address] [place], [place] [address]  Brain natriuretic peptide     Status: Abnormal   Collection Time: 12/08/18 11:40 PM  Result Value Ref Range   B Natriuretic Peptide 1,581.0 (H) 0.0 - 100.0 pg/mL    Comment: Performed  at [facility], [address] [place], [place] [address]  CBC     Status: Abnormal   Collection Time: 12/08/18 11:40 PM  Result Value Ref Range   WBC 8.3 4.0 - 10.5 K/uL   RBC 4.09 (L) 4.22 - 5.81 MIL/uL   Hemoglobin 12.6 (L) 13.0 - 17.0 g/dL   HCT 40.0 39.0 - 52.0 %   MCV 97.8 80.0 - 100.0 fL   MCH 30.8 26.0 - 34.0 pg   MCHC 31.5 30.0 - 36.0 g/dL   RDW 15.7 (H) 11.5 - 15.5 %   Platelets 155 150 - 400 K/uL   nRBC 0.0 0.0 - 0.2 %    Comment: Performed at [facility], [address] [place], [place] [address]  Troponin I -     Status: Abnormal   Collection Time: 12/08/18 11:40 PM  Result Value Ref Range   Troponin I 0.06 (HH) <0.03 ng/mL    Comment: CRITICAL RESULT CALLED TO, READ BACK BY AND VERIFIED WITH BECCA LYNN AT 0020 12/09/2018.  TFK Performed at [facility], [address] [place], [place] [address]   CBC     Status: Abnormal   Collection Time: 12/09/18  6:34 AM  Result Value Ref Range   WBC 8.8 4.0 - 10.5 K/uL   RBC 4.29 4.22 - 5.81 MIL/uL   Hemoglobin 13.4 13.0 - 17.0 g/dL   HCT 19.141.6 47.839.0 - 29.552.0 %   MCV 97.0 80.0 - 100.0 fL   MCH 31.2 26.0 - 34.0 pg   MCHC 32.2 30.0 - 36.0 g/dL   RDW 62.115.9 (H) 30.811.5 - 65.715.5 %   Platelets 144 (L) 150 - 400 K/uL   nRBC 0.0 0.0 - 0.2 %    Comment: Performed at [facility], [address] [place], [place] [address]  Creatinine, serum     Status: Abnormal   Collection Time: 12/09/18  6:34 AM  Result Value Ref Range   Creatinine, Ser 1.19 0.61 - 1.24 mg/dL   GFR calc non Af Amer 57 (L) >60 mL/min   GFR calc Af Amer >60 >60 mL/min    Comment: Performed at [facility], [address] [place], [place] [address]  Troponin I - Now Then Q6H     Status: Abnormal   Collection Time: 12/09/18  6:34 AM  Result Value Ref Range   Troponin I 0.07 (HH) <0.03 ng/mL    Comment: CRITICAL RESULT CALLED TO, READ BACK BY AND VERIFIED WITH ALICIA GRANGER AT [id number] 12/09/2018 DAS Performed at  [facility], [address] [place], [place] [address]    Dg Chest Port 1 View  Result Date: 12/08/2018 CLINICAL DATA:  Shortness of breath. Fall. EXAM: PORTABLE CHEST 1 VIEW COMPARISON:  None. FINDINGS: Mild cardiomegaly with normal mediastinal contours. Vascular congestion. Suspect small pleural effusions. No focal airspace disease. No pneumothorax. Chronic degenerative change of both shoulders. No visualized rib fracture. IMPRESSION: Mild cardiomegaly with vascular congestion. Suspect small pleural effusions. Findings suspicious for fluid overload/mild CHF. Electronically Signed   By: Narda RutherfordMelanie  Sanford M.D.   On: 12/08/2018 23:03    Pending Labs Unresulted Labs (From admission, onward)    Start     Ordered   12/16/18 0500  Creatinine, serum  (enoxaparin (LOVENOX)    CrCl >/= 30 ml/min)  Weekly,   STAT    Comments: while on enoxaparin therapy    12/09/18 0152   12/10/18 0500  Basic metabolic panel  Daily,   STAT     12/09/18 0152   12/09/18 0153  Troponin I - Now Then Q6H  Now then every 6 hours,   STAT     12/09/18 0152          Vitals/Pain Today's Vitals   12/09/18 1000 12/09/18 1030 12/09/18 1100 12/09/18 1132  BP: (!) 144/65 136/62 131/62   Pulse: (!) 56 (!) 56 (!) 51   Resp: 14 15 14    Temp:      TempSrc:      SpO2: 97% 95% 93%   Weight:      Height:      PainSc:    0-No pain    Isolation Precautions No active isolations  Medications Medications  sodium chloride flush (NS) 0.9 % injection 3 mL (3 mLs Intravenous Not Given 12/08/18 2325)  atenolol (TENORMIN) tablet 50 mg (50 mg Oral Given 12/09/18 1011)  tamsulosin (FLOMAX) capsule 0.4 mg (0.4 mg Oral Given 12/09/18 1011)  sodium chloride flush (NS) 0.9 % injection 3 mL (3 mLs Intravenous Given 12/09/18 1012)  sodium chloride  flush (NS) 0.9 % injection 3 mL (has no administration in time range)  0.9 %  sodium chloride infusion (has no administration in time range)  acetaminophen (TYLENOL) tablet 650 mg  (has no administration in time range)  ondansetron (ZOFRAN) injection 4 mg (has no administration in time range)  enoxaparin (LOVENOX) injection 40 mg (40 mg Subcutaneous Given 12/09/18 1011)  bumetanide (BUMEX) injection 1 mg (1 mg Intravenous Given 12/09/18 0117)  aspirin chewable tablet 324 mg (324 mg Oral Given 12/09/18 0056)  hydrALAZINE (APRESOLINE) injection 10 mg (10 mg Intravenous Given 12/09/18 0330)    Mobility non-ambulatory Moderate fall risk   Focused Assessments .   R Recommendations: See Admitting Provider Note  Report given to:   Additional Notes:

## 2018-12-09 NOTE — ED Notes (Signed)
Pt repositioned in bed to eat lunch tray

## 2018-12-09 NOTE — H&P (Signed)
[facility] [place] [facility]   PATIENT NAME: Jerome [name]    MR#:  [id number]  DATE OF BIRTH:  05/01/1935  DATE OF ADMISSION:  12/08/2018  PRIMARY CARE PHYSICIAN: Kandyce RudBabaoff, Marcus, MD   REQUESTING/REFERRING PHYSICIAN: Chiquita LothJade Sung, MD  CHIEF COMPLAINT:   Chief Complaint  Patient presents with  . Leg Swelling  . Fall    HISTORY OF PRESENT ILLNESS:  Jerome [name]  is a 84 y.o. male with a known history of hypertension, gout, renal insufficiency.  He presented to the emergency room via EMS services from home complaining of a fall in the bathroom.  Patient reports he stood up and became short of breath and was unable to ambulate therefore he fell in the bathroom.  He denies head trauma or loss of consciousness.  He denies dizziness or blurred vision.  No diplopia.  No unilateral weakness.  He denies chest pain or palpitations.  However, he is complaining of increased edema to bilateral lower extremities as well as his scrotum for about 2 weeks.  He sees Dr. Larwance SachsBabaoff for primary care and is prescribed amlodipine, Flomax, and aspirin.  He reports only taking Norvasc at times as he feels it causes him to urinate more frequently.  Rapid COVID-19 testing is negative.  BNP on arrival is 1581.  Troponin is 0.06.  Creatinine is 1.26.  Chest x-ray demonstrates vascular congestion with mild CHF.  He received 1 mg IV Bumex in the emergency room.  He has been admitted to the hospitalist service for further management.   PAST MEDICAL HISTORY:   Past Medical History:  Diagnosis Date  . Hypertension     PAST SURGICAL HISTORY:  History reviewed. No pertinent surgical history.  SOCIAL HISTORY:   Social History   Tobacco Use  . Smoking status: Never Smoker  . Smokeless tobacco: Never Used  Substance Use Topics  . Alcohol use: Never    Frequency: Never    FAMILY HISTORY:  History reviewed. No pertinent family history.  DRUG ALLERGIES:  No Known Allergies   REVIEW OF SYSTEMS:   Review of Systems  Constitutional: Negative for chills and fever.  HENT: Negative for congestion, sinus pain and sore throat.   Respiratory: Positive for cough (Nonproductive) and shortness of breath (On exertion). Negative for hemoptysis, sputum production and wheezing.   Cardiovascular: Positive for orthopnea and leg swelling. Negative for chest pain.  Gastrointestinal: Negative for abdominal pain, constipation, diarrhea, heartburn, nausea and vomiting.  Genitourinary: Negative for dysuria, flank pain and hematuria.  Musculoskeletal: Positive for falls (Fell at home earlier in the evening the bathroom).  Skin: Negative.  Negative for itching and rash.  Neurological: Negative for dizziness, loss of consciousness, weakness and headaches.  Psychiatric/Behavioral: Negative.  Negative for depression.    MEDICATIONS AT HOME:   Prior to Admission medications   Not on File      VITAL SIGNS:  Blood pressure (!) 185/81, pulse 79, temperature 98.3 F (36.8 C), temperature source Oral, resp. rate (!) 21, height 5' 7.99" (1.727 m), weight 113.9 kg, SpO2 100 %.  PHYSICAL EXAMINATION:  Physical Exam Vitals signs and nursing note reviewed.  Constitutional:      General: He is not in acute distress.    Appearance: He is obese. He is ill-appearing.  HENT:     Head: Normocephalic.     Right Ear: External ear normal.     Left Ear: External ear normal.     Nose: Nose normal.  Mouth/Throat:     Mouth: Mucous membranes are moist.     Pharynx: Oropharynx is clear.  Eyes:     General: No scleral icterus.    Extraocular Movements: Extraocular movements intact.     Conjunctiva/sclera: Conjunctivae normal.     Pupils: Pupils are equal, round, and reactive to light.  Neck:     Musculoskeletal: Normal range of motion and neck supple.  Cardiovascular:     Rate and Rhythm: Normal rate and regular rhythm.     Pulses: Normal pulses.     Heart sounds: Normal heart sounds. No  murmur. No friction rub. No gallop.   Pulmonary:     Effort: No respiratory distress.     Breath sounds: Rales (Bilateral lower) present. No wheezing or rhonchi.  Abdominal:     General: Bowel sounds are normal. There is no distension.     Palpations: Abdomen is soft.     Tenderness: There is no guarding or rebound.  Musculoskeletal: Normal range of motion.        General: Swelling present. No tenderness.     Right lower leg: Edema (3+ pitting edema) present.     Left lower leg: Edema (3+ pitting edema) present.  Skin:    General: Skin is warm and dry.     Coloration: Skin is not jaundiced.     Findings: No rash.  Neurological:     General: No focal deficit present.     Mental Status: He is alert and oriented to person, place, and time.     Cranial Nerves: No cranial nerve deficit.     Sensory: No sensory deficit.     Motor: No weakness.  Psychiatric:        Mood and Affect: Mood normal.        Behavior: Behavior normal.     LABORATORY PANEL:   CBC Recent Labs  Lab 12/08/18 2340  WBC 8.3  HGB 12.6*  HCT 40.0  PLT 155   ------------------------------------------------------------------------------------------------------------------  Chemistries  Recent Labs  Lab 12/08/18 2340  NA 141  K 4.1  CL 105  CO2 27  GLUCOSE 92  BUN 19  CREATININE 1.26*  CALCIUM 9.0   ------------------------------------------------------------------------------------------------------------------  Cardiac Enzymes Recent Labs  Lab 12/08/18 2340  TROPONINI 0.06*   ------------------------------------------------------------------------------------------------------------------  RADIOLOGY:  Dg Chest Port 1 View  Result Date: 12/08/2018 CLINICAL DATA:  Shortness of breath. Fall. EXAM: PORTABLE CHEST 1 VIEW COMPARISON:  None. FINDINGS: Mild cardiomegaly with normal mediastinal contours. Vascular congestion. Suspect small pleural effusions. No focal airspace disease. No  pneumothorax. Chronic degenerative change of both shoulders. No visualized rib fracture. IMPRESSION: Mild cardiomegaly with vascular congestion. Suspect small pleural effusions. Findings suspicious for fluid overload/mild CHF. Electronically Signed   By: Narda RutherfordMelanie  Sanford M.D.   On: 12/08/2018 23:03      IMPRESSION AND PLAN:   1.  Acute on chronic exacerbation diastolic CHF - Patient received 1 mg IV Bumex in the emergency room.  We will continue diuresis with Lasix 40 mg IV twice daily and monitor renal function closely with current creatinine 1.26. - He is on telemetry monitoring. - Echocardiogram in the a.m. - Cardiology consulted for further evaluation and recommendations -DuoNebs every 6 hours as needed for shortness of breath  2.  Renal insufficiency -With creatinine 1.26 -We will continue to monitor closely particularly in the setting of exacerbated CHF with diuretic therapy  3.  Hypertension - Norvasc continued - Treat persistent hypertension expectantly - We have discussed the  importance of taking his medications as they have been prescribed - Telemetry monitoring  4.  Elevated troponin - We will continue to trend troponin levels every 6 hours --Cardiology consulted for further evaluation and recommendations - Likely secondary to demand ischemia as a result of acute on chronic CHF -We will repeat EKG in the a.m. -He is on telemetry monitoring  Patient started on DVT and PPI prophylaxis    All the records are reviewed and case discussed with ED provider. The plan of care was discussed in details with the patient (and family). I answered all questions. The patient agreed to proceed with the above mentioned plan. Further management will depend upon hospital course.   CODE STATUS: Full code  TOTAL TIME TAKING CARE OF THIS PATIENT: 45 minutes.    [place] on 12/09/2018 at 12:37 AM  Pager - [phone]  After 6pm go to [email or web address] - Proofreader   [facility] [place] [facility]  Office  [phone]  CC: Primary care physician; Derinda Late, MD   Note: This dictation was prepared with Dragon dictation along with smaller phrase technology. Any transcriptional errors that result from this process are unintentional.

## 2018-12-09 NOTE — Progress Notes (Signed)
*  PRELIMINARY RESULTS* Echocardiogram 2D Echocardiogram has been performed.  Wallie Char Shatara Stanek 12/09/2018, 1:26 PM

## 2018-12-09 NOTE — ED Notes (Signed)
MD Beather Arbour aware of pt's troponin level.

## 2018-12-09 NOTE — ED Notes (Signed)
Attempted to call report to 2A nurse but was unsuccesful

## 2018-12-09 NOTE — Progress Notes (Signed)
Patient ID: Jerome Piccolo., male   DOB: 1935-03-03, 84 y.o.   MRN: [id number] patient seen and examined in the emergency room. He is regularly feeling better. His room air sats are 98%. Will continue IV Lasix for diuresis. Echocardiogram done results pending. Further workup according to patient's hospital stay.

## 2018-12-10 ENCOUNTER — Encounter: Payer: Self-pay | Admitting: *Deleted

## 2018-12-10 LAB — BASIC METABOLIC PANEL
Anion gap: 9 (ref 5–15)
BUN: 19 mg/dL (ref 8–23)
CO2: 32 mmol/L (ref 22–32)
Calcium: 8.3 mg/dL — ABNORMAL LOW (ref 8.9–10.3)
Chloride: 98 mmol/L (ref 98–111)
Creatinine, Ser: 1.25 mg/dL — ABNORMAL HIGH (ref 0.61–1.24)
GFR calc Af Amer: >60 mL/min (ref >60)
GFR calc non Af Amer: 53 mL/min — ABNORMAL LOW (ref >60)
Glucose, Bld: 101 mg/dL — ABNORMAL HIGH (ref 70–99)
Potassium: 3.6 mmol/L (ref 3.5–5.1)
Sodium: 139 mmol/L (ref 135–145)

## 2018-12-10 MED ORDER — HYDRALAZINE HCL 20 MG/ML IJ SOLN
10.0000 mg | Freq: Four times a day (QID) | INTRAMUSCULAR | Status: DC | PRN
  Administered 2018-12-10: 10 mg via INTRAVENOUS
  Filled 2018-12-10: qty 1

## 2018-12-10 MED ORDER — FUROSEMIDE 10 MG/ML IJ SOLN
40.0000 mg | Freq: Every day | INTRAMUSCULAR | Status: DC
  Administered 2018-12-11 – 2018-12-12 (×2): 40 mg via INTRAVENOUS
  Filled 2018-12-10 (×2): qty 4

## 2018-12-10 MED ORDER — FUROSEMIDE 10 MG/ML IJ SOLN
40.0000 mg | Freq: Two times a day (BID) | INTRAMUSCULAR | Status: DC
  Administered 2018-12-10 (×2): 40 mg via INTRAVENOUS
  Filled 2018-12-10 (×2): qty 4

## 2018-12-10 MED ORDER — ENSURE ENLIVE PO LIQD
237.0000 mL | Freq: Two times a day (BID) | ORAL | Status: DC
  Administered 2018-12-10 – 2018-12-12 (×5): 237 mL via ORAL

## 2018-12-10 MED ORDER — ADULT MULTIVITAMIN W/MINERALS CH
1.0000 | ORAL_TABLET | Freq: Every day | ORAL | Status: DC
  Administered 2018-12-11 – 2018-12-12 (×2): 1 via ORAL
  Filled 2018-12-10 (×2): qty 1

## 2018-12-10 MED ORDER — CARVEDILOL 3.125 MG PO TABS
3.1250 mg | ORAL_TABLET | Freq: Two times a day (BID) | ORAL | Status: DC
  Administered 2018-12-10 – 2018-12-11 (×2): 3.125 mg via ORAL
  Filled 2018-12-10 (×2): qty 1

## 2018-12-10 MED ORDER — LISINOPRIL 5 MG PO TABS
5.0000 mg | ORAL_TABLET | Freq: Every day | ORAL | Status: DC
  Administered 2018-12-10 – 2018-12-11 (×2): 5 mg via ORAL
  Filled 2018-12-10 (×2): qty 1

## 2018-12-10 NOTE — Progress Notes (Signed)
[facility] NAME: Jerome [name]    MR#:  [id number]  DATE OF BIRTH:  [birth year]  SUBJECTIVE:   Shortness of breath improved. Urinating well. Complains of weakness with right knee giving out easily due to previous surgery REVIEW OF SYSTEMS:   Review of Systems  Constitutional: Negative for chills, fever and weight loss.  HENT: Negative for ear discharge, ear pain and nosebleeds.   Eyes: Negative for blurred vision, pain and discharge.  Respiratory: Positive for shortness of breath. Negative for sputum production, wheezing and stridor.   Cardiovascular: Negative for chest pain, palpitations, orthopnea and PND.  Gastrointestinal: Negative for abdominal pain, diarrhea, nausea and vomiting.  Genitourinary: Negative for frequency and urgency.  Musculoskeletal: Positive for joint pain. Negative for back pain.  Neurological: Positive for weakness. Negative for sensory change, speech change and focal weakness.  Psychiatric/Behavioral: Negative for depression and hallucinations. The patient is not nervous/anxious.    Tolerating Diet:yesTolerating PT:   DRUG ALLERGIES:  No Known Allergies  VITALS:  Blood pressure (!) 157/70, pulse (!) 59, temperature 98.4 F (36.9 C), temperature source Oral, resp. rate 16, height 5' 7.99" (1.727 m), weight 120.7 kg, SpO2 98 %.  PHYSICAL EXAMINATION:   Physical Exam  GENERAL:  85 y.o.-year-old patient lying in the bed with no acute distress. Obese EYES: Pupils equal, round, reactive to light and accommodation. No scleral icterus. Extraocular muscles intact.  HEENT: Head atraumatic, normocephalic. Oropharynx and nasopharynx clear.  NECK:  Supple, no jugular venous distention. No thyroid enlargement, no tenderness.  LUNGS: Distant breath sounds bilaterally, no wheezing, rales, rhonchi. No use of accessory muscles of respiration.  CARDIOVASCULAR: S1, S2 normal. No murmurs, rubs, or gallops.   ABDOMEN: Soft, nontender, nondistended. Bowel sounds present. No organomegaly or mass.  EXTREMITIES: ++ edema b/l.   Most disgusting toe nails! NEUROLOGIC: Cranial nerves II through XII are intact. No focal Motor or sensory deficits b/l.   PSYCHIATRIC:  patient is alert and oriented x 3.  SKIN: No obvious rash, lesion, or ulcer.   LABORATORY PANEL:  CBC Recent Labs  Lab 12/09/18 0634  WBC 8.8  HGB 13.4  HCT 41.6  PLT 144*    Chemistries  Recent Labs  Lab 12/10/18 0616  NA 139  K 3.6  CL 98  CO2 32  GLUCOSE 101*  BUN 19  CREATININE 1.25*  CALCIUM 8.3*   Cardiac Enzymes Recent Labs  Lab 12/09/18 2059  TROPONINI 0.08*   RADIOLOGY:  Dg Chest Port 1 View  Result Date: 12/08/2018 CLINICAL DATA:  Shortness of breath. Fall. EXAM: PORTABLE CHEST 1 VIEW COMPARISON:  None. FINDINGS: Mild cardiomegaly with normal mediastinal contours. Vascular congestion. Suspect small pleural effusions. No focal airspace disease. No pneumothorax. Chronic degenerative change of both shoulders. No visualized rib fracture. IMPRESSION: Mild cardiomegaly with vascular congestion. Suspect small pleural effusions. Findings suspicious for fluid overload/mild CHF. Electronically Signed   By: Keith Rake M.D.   On: 12/08/2018 23:03   ASSESSMENT AND PLAN:  Jerome [name]  is a 84 y.o. [name] with a known history of hypertension, gout, renal insufficiency.  He presented to the emergency room via EMS services from home complaining of a fall in the bathroom  1.  Acute on chronic exacerbation systolic CHF - continue diuresis with Lasix 40 mg IV twice daily--UOP 8.3 liters--change to daily lasix and monitor renal function closely with current creatinine 1.26. - Echocardiogram EF 45-50% - will set up pt for CHF  clinic -coreg and low dose lisniopril -DuoNebs every 6 hours as needed for shortness of breath  2.  Chronic Renal insufficiency III -With creatinine 1.26 -We will continue to monitor closely  particularly in the setting of exacerbated CHF with diuretic therapy  3.  Hypertension -on coreg and lisinopril - Treat persistent hypertension expectantly - We have discussed the importance of taking his medications as they have been prescribed  4.  Elevated troponin--no cp - We will continue to trend troponin levels every 6 hours - Likely secondary to demand ischemia as a result of acute on chronic CHF -cont asa   Patient started on DVT and PPI prophylaxis  PT to see pt  D/w son Onalee Huadavid on the phone  Case discussed with Care Management/Social Worker.   CODE STATUS: full  DVT Prophylaxis: heparin  TOTAL TIME TAKING CARE OF THIS PATIENT: *30* minutes.  >50% time spent on counselling and coordination of care  POSSIBLE D/C IN 1-2** DAYS, DEPENDING ON CLINICAL CONDITION.  Note: This dictation was prepared with Dragon dictation along with smaller phrase technology. Any transcriptional errors that result from this process are unintentional.  Enedina FinnerSona Arcelia Pals M.D on 12/10/2018 at 5:55 PM  Between 7am to 6pm - Pager - [phone]  After 6pm go to [email or web address] - Social research officer, governmentpassword [email or web address]  [facility]  Office  [phone]  CC: Primary care physician; Kandyce RudBabaoff, Marcus, MDPatient ID: Jerome EssexGordon Craig Cryderman Jr., [name]   DOB: July 13, 1934, 84 y.o.   MRN: [id number]

## 2018-12-10 NOTE — Progress Notes (Signed)
Ch visited w/ pt to educated them on assigning a HPOA who is here for CHF. Pt shared w/ ch that he is having trouble with walking and falling a lot at home. Pt was brought to ER related to a fall in the bathroom as reported by pt's son. Ch informed pt of the difference between a HPOA and a will regarding his personal property. Ch encouraged pt to finalize an AD while he was here after informing his son what his wishes are. Ch spoke with son in length regarding the decline in the pt's health over the years. As reported by the pt's son, the pt has not fully recovered from a knee surgery years ago and desires to be very independent regarding his medical needs. Ch understood the concerns of the pt and son and is hopeful that the pt can be approved for H-H especially since the son nor pt wants to consider a SNF placement at this time.  F/u goals would include checking to see if the pt son has been updated on the pt's status, connecting with the IDT to see if case management has spoken with the pt, and checking w/ pt to see if he want to finalize AD while admitted as a pt.    12/10/18 1000  Clinical Encounter Type  Visited With Patient and family together (family contacted via telephone )  Visit Type Other (Comment);Spiritual support;Social support;Psychological support (AD education )  Referral From Physician  Consult/Referral To Chaplain  Recommendations pt could benefit from f/u with a case manager or SW  Spiritual Encounters  Spiritual Needs Emotional;Grief support  Stress Factors  Patient Stress Factors Exhausted;Family relationships;Health changes;Lack of knowledge;Loss of control;Major life changes  Family Stress Factors Loss of control;Major life changes;Lack of caregivers;Exhausted  Advance Directives (For Healthcare)  Does Patient Have a Medical Advance Directive? No  Does patient want to make changes to medical advance directive? No - Patient declined

## 2018-12-10 NOTE — Progress Notes (Signed)
PT Cancellation Note  Patient Details Name: Jerome [name]. MRN: [id number] DOB: 09-16-1934   Cancelled Treatment:    Reason Eval/Treat Not Completed: Other (comment)(Pt refused at this time, reported he is having trouble controlling his bowels, requesting PT to re-attempt later.)   Lieutenant Diego PT, DPT 11:40 AM,12/10/18 [phone]

## 2018-12-10 NOTE — Plan of Care (Signed)
Continue to re educate pt about heart failure, but pt continues to say "nothing is wrong with my heart, if you'll fix my legs I will be fine" pt has heart failure packet, and tried to get pt to watch emmi heart failure videos but pt does not want to watch them. Blood pressure high this morning, paged MD on call about it, waiting for response  Problem: Nutrition: Goal: Adequate nutrition will be maintained Outcome: Progressing   Problem: Coping: Goal: Level of anxiety will decrease Outcome: Progressing   Problem: Elimination: Goal: Will not experience complications related to urinary retention Outcome: Progressing   Problem: Pain Managment: Goal: General experience of comfort will improve Outcome: Progressing Note: No complaints of pain this shift   Problem: Safety: Goal: Ability to remain free from injury will improve Outcome: Progressing

## 2018-12-10 NOTE — Plan of Care (Signed)
Patient has been educated on importance of daily weights and has also seen 2 out of 4 EMMI videos. "What is Heart Failure, Daily weights and Fluid pills." Patient eager to learn and appreciates watching videos, however still states that his legs are the problem and that there is nothing wrong with his heart. Repeat education provided.   Problem: Education: Goal: Ability to demonstrate management of disease process will improve Outcome: Not Progressing Goal: Ability to verbalize understanding of medication therapies will improve Outcome: Progressing Goal: Individualized Educational Video(s) Outcome: Progressing   Problem: Education: Goal: Ability to verbalize understanding of medication therapies will improve Outcome: Progressing   Problem: Education: Goal: Individualized Educational Video(s) Outcome: Progressing   Problem: Activity: Goal: Capacity to carry out activities will improve Outcome: Not Progressing Note: Patient remains unable to get OOB, reports his legs are too weak. Requires assistance when turning from side to side as well.

## 2018-12-10 NOTE — Plan of Care (Signed)
Nutrition Education Note  RD consulted for nutrition education regarding CHF.  84 y/o male admitted with CHF  Spoke with pt via phone. Pt reports good appetite and oral intake pta and today; pt eating 100% of meals. Pt does not drink supplements at home but is willing to try them while in hospital.   RD provided "Low Sodium Nutrition Therapy" handout from the Academy of [employer]etics. Reviewed patient's dietary recall. Provided examples on ways to decrease sodium intake in diet. Discouraged intake of processed foods and use of salt shaker. Encouraged fresh fruits and vegetables as well as whole grain sources of carbohydrates to maximize fiber intake.   RD discussed why it is important for patient to adhere to diet recommendations, and emphasized the role of fluids, foods to avoid, and importance of weighing self daily. Teach back method used.  Expect good compliance.  Body mass index is 40.45 kg/m. Pt meets criteria for obesity based on current BMI.  Current diet order is HH, patient is consuming approximately 100% of meals at this time. Labs and medications reviewed. No further nutrition interventions warranted at this time. RD contact information provided. If additional nutrition issues arise, please re-consult RD.   Jerome Distance MS, RD, LDN Pager #- [phone] Office#- [phone] After Hours Pager: [phone]

## 2018-12-10 NOTE — Progress Notes (Signed)
Pt's BP elevated this morning as follows Vitals:   12/10/18 0431 12/10/18 0632  BP: (!) 186/87 (!) 186/82  Pulse: (!) 50   Resp:    Temp: 97.8 F (36.6 C)   SpO2: 96%    Spoke with oncall MD Dr. Sidney Ace, gave verbal orders for hydralazine 10mg  PRN q6hr.   Conley Simmonds, RN, BSN

## 2018-12-11 LAB — BASIC METABOLIC PANEL
Anion gap: 10 (ref 5–15)
BUN: 21 mg/dL (ref 8–23)
CO2: 30 mmol/L (ref 22–32)
Calcium: 8 mg/dL — ABNORMAL LOW (ref 8.9–10.3)
Chloride: 97 mmol/L — ABNORMAL LOW (ref 98–111)
Creatinine, Ser: 1.17 mg/dL (ref 0.61–1.24)
GFR calc Af Amer: >60 mL/min (ref >60)
GFR calc non Af Amer: 58 mL/min — ABNORMAL LOW (ref >60)
Glucose, Bld: 111 mg/dL — ABNORMAL HIGH (ref 70–99)
Potassium: 3.1 mmol/L — ABNORMAL LOW (ref 3.5–5.1)
Sodium: 137 mmol/L (ref 135–145)

## 2018-12-11 MED ORDER — LISINOPRIL 10 MG PO TABS
10.0000 mg | ORAL_TABLET | Freq: Two times a day (BID) | ORAL | Status: DC
  Administered 2018-12-11 – 2018-12-12 (×2): 10 mg via ORAL
  Filled 2018-12-11 (×2): qty 1

## 2018-12-11 MED ORDER — TRAMADOL HCL 50 MG PO TABS
50.0000 mg | ORAL_TABLET | Freq: Four times a day (QID) | ORAL | Status: DC | PRN
  Administered 2018-12-11: 50 mg via ORAL
  Filled 2018-12-11: qty 1

## 2018-12-11 MED ORDER — LISINOPRIL 5 MG PO TABS
5.0000 mg | ORAL_TABLET | Freq: Once | ORAL | Status: AC
  Administered 2018-12-11: 5 mg via ORAL
  Filled 2018-12-11: qty 1

## 2018-12-11 MED ORDER — CARVEDILOL 6.25 MG PO TABS
6.2500 mg | ORAL_TABLET | Freq: Two times a day (BID) | ORAL | Status: DC
  Administered 2018-12-11 – 2018-12-12 (×2): 6.25 mg via ORAL
  Filled 2018-12-11 (×2): qty 1

## 2018-12-11 MED ORDER — LISINOPRIL 10 MG PO TABS: 10.0000 mg | ORAL_TABLET | Freq: Every day | ORAL | Status: DC

## 2018-12-11 NOTE — Evaluation (Signed)
Physical Therapy Evaluation Patient Details Name: Jerome EssexGordon Craig Ewer Jr. MRN: [id number] DOB: 10/27/1934 Today's Date: 12/11/2018   History of Present Illness  Patient is an 84 y/o male that presents after a fall in his bathroom at home. 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  Clinical Impression  Patient is an 84 y/o male that presents after fall at home, he was noted to be hypoxic/dyspnic at the time of his fall. He has had difficulty with an old R TKR and has started having progressive L hip/knee pain recently. He has become significantly more deconditioned, and has been struggling to get in and out of his house safely for some time now. He requires substantial assistance for all mobility and transfers at this time, and fatigues quite quickly with even assisted bed mobility. He has made a sharp decline after a long gradual decline and would likely benefit from SNF placement at discharge to improve his functional mobility.     Follow Up Recommendations SNF    Equipment Recommendations  Rolling walker with 5" wheels    Recommendations for Other Services       Precautions / Restrictions Precautions Precautions: Fall Restrictions Weight Bearing Restrictions: No      Mobility  Bed Mobility Overal bed mobility: Needs Assistance Bed Mobility: Supine to Sit     Supine to sit: Mod assist;Max assist     General bed mobility comments: Required significant cuing for technique and physical assistance.  Transfers Overall transfer level: Needs assistance Equipment used: Rolling walker (2 wheeled) Transfers: Sit to/from Stand Sit to Stand: +2 physical assistance;Mod assist;Max assist;From elevated surface         General transfer comment: Required +2 assist for physical force deficit(s).  Ambulation/Gait Ambulation/Gait assistance: +2 physical assistance;Min assist;Mod  assist Gait Distance (Feet): 3 Feet Assistive device: Rolling walker (2 wheeled)     Gait velocity interpretation: <1.31 ft/sec, indicative of household ambulator General Gait Details: Patient required physical assistance to maintain upright posture and sliding feet to get from bed to chair.  Stairs            Wheelchair Mobility    Modified Rankin (Stroke Patients Only)       Balance Overall balance assessment: History of Falls;Needs assistance Sitting-balance support: Feet supported;Bilateral upper extremity supported Sitting balance-Leahy Scale: Good     Standing balance support: Bilateral upper extremity supported Standing balance-Leahy Scale: Poor Standing balance comment: Requires assistance maintaining "neutral" pelvis so as not to lean posteriorly.                             Pertinent Vitals/Pain Pain Assessment: Faces Faces Pain Scale: Hurts even more Pain Location: R thigh, L knee Pain Descriptors / Indicators: Aching;Sore Pain Intervention(s): Limited activity within patient's tolerance;Monitored during session;Repositioned    Home Living Family/patient expects to be discharged to:: Private residence Living Arrangements: Children Available Help at Discharge: Family;Available PRN/intermittently Type of Home: House Home Access: Stairs to enter Entrance Stairs-Rails: None Entrance Stairs-Number of Steps: 3-4 Home Layout: One level Home Equipment: Walker - 2 wheels;Cane - single point      Prior Function Level of Independence: Needs assistance         Comments: Patient reports he was very limited with mobility prior to this admission, requiring use of furniture and other hand holds to grab on to  for balance/support.     Hand Dominance        Extremity/Trunk Assessment   Upper Extremity Assessment Upper Extremity Assessment: Overall WFL for tasks assessed    Lower Extremity Assessment Lower Extremity Assessment: RLE  deficits/detail;LLE deficits/detail RLE Deficits / Details: Difficulty going through full knee extension ROM on R in sitting. LLE Deficits / Details: Notes pain in L knee/hip with standing but no buckling.       Communication   Communication: No difficulties  Cognition Arousal/Alertness: Awake/alert Behavior During Therapy: WFL for tasks assessed/performed Overall Cognitive Status: Within Functional Limits for tasks assessed                                        General Comments General comments (skin integrity, edema, etc.): Catheter in place    Exercises General Exercises - Lower Extremity Long Arc Quad: AROM;Both;15 reps Hip Flexion/Marching: AROM;Seated;Both;15 reps Other Exercises Other Exercises: Performed multiple attempts at sit to stand with max A x 1 and elevated bed surface with cuing for technique.   Assessment/Plan    PT Assessment Patient needs continued PT services  PT Problem List Decreased strength;Decreased mobility;Decreased safety awareness;Decreased range of motion;Decreased knowledge of precautions;Cardiopulmonary status limiting activity;Decreased balance;Decreased knowledge of use of DME;Obesity       PT Treatment Interventions DME instruction;Therapeutic exercise;Gait training;Balance training;Stair training;Neuromuscular re-education;Therapeutic activities;Patient/family education    PT Goals (Current goals can be found in the Care Plan section)  Acute Rehab PT Goals Patient Stated Goal: To get stronger and become more independent. PT Goal Formulation: With patient Time For Goal Achievement: 12/25/18 Potential to Achieve Goals: Fair    Frequency Min 2X/week   Barriers to discharge Inaccessible home environment 4 steps to get in, which he is unable to perform safely.    Co-evaluation               AM-PAC PT "6 Clicks" Mobility  Outcome Measure Help needed turning from your back to your side while in a flat bed without  using bedrails?: Total Help needed moving from lying on your back to sitting on the side of a flat bed without using bedrails?: Total Help needed moving to and from a bed to a chair (including a wheelchair)?: A Lot Help needed standing up from a chair using your arms (e.g., wheelchair or bedside chair)?: A Lot Help needed to walk in hospital room?: Total Help needed climbing 3-5 steps with a railing? : Total 6 Click Score: 8    End of Session Equipment Utilized During Treatment: Gait belt Activity Tolerance: Patient tolerated treatment well;Patient limited by fatigue Patient left: in chair;with chair alarm set;with call bell/phone within reach Nurse Communication: Mobility status PT Visit Diagnosis: History of falling (Z91.81);Muscle weakness (generalized) (M62.81)    Time: 2637-8588 PT Time Calculation (min) (ACUTE ONLY): 26 min   Charges:   PT Evaluation $PT Eval Moderate Complexity: 1 Mod PT Treatments $Therapeutic Exercise: 8-22 mins       Royce Macadamia PT, DPT, CSCS    12/11/2018, 12:54 PM

## 2018-12-11 NOTE — NC FL2 (Signed)
[place] LEVEL OF CARE SCREENING TOOL     IDENTIFICATION  Patient Name: Jerome [name]. Birthdate: 1935/05/23 Sex: male Admission Date (Current Location): 12/08/2018  [place] and [place] Number:  Engineering geologist and Address:  [facility], [address] [place], [place] [address]      Provider Number: [id number]  Attending Physician Name and Address:  Fritzi Mandes, MD  Relative Name and Phone Number:  Glenden Rossell [phone]    Current Level of Care: SNF Recommended Level of Care: [place] Prior Approval Number:    Date Approved/Denied:   PASRR Number: [id number]  Discharge Plan: SNF    Current Diagnoses: Patient Active Problem List   Diagnosis Date Noted  . Acute exacerbation of CHF (congestive heart failure) ([place]) 12/09/2018    Orientation RESPIRATION BLADDER Height & Weight     Self, Time, Situation, Place  Normal Incontinent, External catheter Weight: 115.4 kg Height:  5' 7.99" (172.7 cm)  BEHAVIORAL SYMPTOMS/MOOD NEUROLOGICAL BOWEL NUTRITION STATUS      Continent Diet  AMBULATORY STATUS COMMUNICATION OF NEEDS Skin   Limited Assist Verbally Skin abrasions, Bruising                       Personal Care Assistance Level of Assistance  Bathing, Dressing, Feeding Bathing Assistance: Limited assistance Feeding assistance: Independent Dressing Assistance: Limited assistance     Functional Limitations Info  Sight, Speech, Hearing Sight Info: Impaired Hearing Info: Adequate Speech Info: Adequate    SPECIAL CARE FACTORS FREQUENCY  OT (By licensed OT), PT (By licensed PT)     PT Frequency: 5x per week OT Frequency: 5x per week            Contractures Contractures Info: Not present    Additional Factors Info  Code Status, Allergies Code Status Info: Full Allergies Info: no allergies           Current Medications (12/11/2018):  This is the current hospital active  medication list Current Facility-Administered Medications  Medication Dose Route Frequency Provider Last Rate Last Dose  . 0.9 %  sodium chloride infusion  250 mL Intravenous PRN Seals, Theo Dills, NP      . acetaminophen (TYLENOL) tablet 650 mg  650 mg Oral Q4H PRN Seals, Levada Dy H, NP      . carvedilol (COREG) tablet 6.25 mg  6.25 mg Oral BID WC Fritzi Mandes, MD      . enoxaparin (LOVENOX) injection 40 mg  40 mg Subcutaneous Q24H Seals, Angela H, NP   40 mg at 12/11/18 0600  . feeding supplement (ENSURE ENLIVE) (ENSURE ENLIVE) liquid 237 mL  237 mL Oral BID BM Fritzi Mandes, MD   237 mL at 12/11/18 0906  . furosemide (LASIX) injection 40 mg  40 mg Intravenous Daily Fritzi Mandes, MD   40 mg at 12/11/18 0903  . hydrALAZINE (APRESOLINE) injection 10 mg  10 mg Intravenous Q6H PRN Mansy, Jan A, MD   10 mg at 12/10/18 0848  . lisinopril (ZESTRIL) tablet 10 mg  10 mg Oral BID Fritzi Mandes, MD      . lisinopril (ZESTRIL) tablet 5 mg  5 mg Oral Once Fritzi Mandes, MD      . multivitamin with minerals tablet 1 tablet  1 tablet Oral Daily Fritzi Mandes, MD   1 tablet at 12/11/18 [phone]  . ondansetron (ZOFRAN) injection 4 mg  4 mg Intravenous Q6H PRN Seals, Theo Dills, NP      .  sodium chloride flush (NS) 0.9 % injection 3 mL  3 mL Intravenous Once Irean HongSung, Jade J, MD      . sodium chloride flush (NS) 0.9 % injection 3 mL  3 mL Intravenous Q12H Seals, Marylene LandAngela H, NP   3 mL at 12/11/18 0904  . sodium chloride flush (NS) 0.9 % injection 3 mL  3 mL Intravenous PRN Seals, Marylene LandAngela H, NP      . tamsulosin (FLOMAX) capsule 0.4 mg  0.4 mg Oral Daily Seals, Angela H, NP   0.4 mg at 12/11/18 [phone]     Discharge Medications: Please see discharge summary for a list of discharge medications.  Relevant Imaging Results:  Relevant Lab Results:   Additional Information ssn [id number]  Virgel ManifoldJosh A Sawyer [place]zer, RN

## 2018-12-11 NOTE — TOC Transition Note (Signed)
Transition of Care [facility]) - CM/SW Discharge Note   Patient Details  Name: Jerome [name]. MRN: [id number] Date of Birth: [birth year]  [facility] CM/SW Contact:  Latanya Maudlin, RN Phone Number: 12/11/2018, 2:31 PM   Clinical Narrative:  TOC consulted to assist with disposition. Patient currently lives at home and spends the night with his son. Patient uses a walker and cane at baseline. PT recommending SNF and patient is agreeable. FL2 completed and bed requests sent out through the Hub, awaiting offers. Patients first choice is [facility] and second choice is [facility].      Final next level of care: Skilled Nursing Facility Barriers to Discharge: Continued Medical Work up   Patient Goals and CMS Choice        Discharge Placement                       Discharge Plan and Services In-house Referral: Clinical Social Work Discharge Planning Services: CM Consult Post Acute Care Choice: [place]                               Social Determinants of Health (SDOH) Interventions     Readmission Risk Interventions Readmission Risk Prevention Plan 12/11/2018  Post Dischage Appt Complete  Medication Screening Complete  Transportation Screening Complete  Some recent data might be hidden

## 2018-12-11 NOTE — Progress Notes (Signed)
[place] at [place] NAME: Jerome [name]    MR#:  [id number]  DATE OF BIRTH:  1935/05/18  SUBJECTIVE:   Shortness of breath improved. Urinating well. Complains of weakness REVIEW OF SYSTEMS:   Review of Systems  Constitutional: Negative for chills, fever and weight loss.  HENT: Negative for ear discharge, ear pain and nosebleeds.   Eyes: Negative for blurred vision, pain and discharge.  Respiratory: Positive for shortness of breath. Negative for sputum production, wheezing and stridor.   Cardiovascular: Negative for chest pain, palpitations, orthopnea and PND.  Gastrointestinal: Negative for abdominal pain, diarrhea, nausea and vomiting.  Genitourinary: Negative for frequency and urgency.  Musculoskeletal: Positive for joint pain. Negative for back pain.  Neurological: Positive for weakness. Negative for sensory change, speech change and focal weakness.  Psychiatric/Behavioral: Negative for depression and hallucinations. The patient is not nervous/anxious.    Tolerating Diet:yesTolerating PT:   DRUG ALLERGIES:  No Known Allergies  VITALS:  Blood pressure (!) 147/67, pulse 62, temperature 98.5 F (36.9 C), temperature source Oral, resp. rate 16, height 5' 7.99" (1.727 m), weight 115.4 kg, SpO2 95 %.  PHYSICAL EXAMINATION:   Physical Exam  GENERAL:  84 y.o.-year-old patient lying in the bed with no acute distress. Obese EYES: Pupils equal, round, reactive to light and accommodation. No scleral icterus. Extraocular muscles intact.  HEENT: Head atraumatic, normocephalic. Oropharynx and nasopharynx clear.  NECK:  Supple, no jugular venous distention. No thyroid enlargement, no tenderness.  LUNGS: Distant breath sounds bilaterally, no wheezing, rales, rhonchi. No use of accessory muscles of respiration.  CARDIOVASCULAR: S1, S2 normal. No murmurs, rubs, or gallops.  ABDOMEN: Soft, nontender, nondistended. Bowel sounds present. No  organomegaly or mass.  EXTREMITIES: ++ edema b/l.   Most disgusting toe nails! NEUROLOGIC: Cranial nerves II through XII are intact. No focal Motor or sensory deficits b/l.   PSYCHIATRIC:  patient is alert and oriented x 3.  SKIN: No obvious rash, lesion, or ulcer.   LABORATORY PANEL:  CBC Recent Labs  Lab 12/09/18 0634  WBC 8.8  HGB 13.4  HCT 41.6  PLT 144*    Chemistries  Recent Labs  Lab 12/11/18 0213  NA 137  K 3.1*  CL 97*  CO2 30  GLUCOSE 111*  BUN 21  CREATININE 1.17  CALCIUM 8.0*   Cardiac Enzymes Recent Labs  Lab 12/09/18 2059  TROPONINI 0.08*   RADIOLOGY:  No results found. ASSESSMENT AND PLAN:  Jerome [name]  is a 84 y.o. male with a known history of hypertension, gout, renal insufficiency.  He presented to the emergency room via EMS services from home complaining of a fall in the bathroom  1.  Acute on chronic exacerbation systolic CHF - continue diuresis with Lasix 40 mg IV twice daily--UOP 12.3 liters--change to daily lasix and monitor renal function closely with current creatinine 1.26. - Echocardiogram EF 45-50% - will set up pt for CHF clinic -coreg and low dose lisniopril -DuoNebs every 6 hours as needed for shortness of breath  2.  Chronic Renal insufficiency III -With creatinine 1.26--1.17 improved -We will continue to monitor closely particularly in the setting of exacerbated CHF with diuretic therapy  3.  Hypertension -on coreg and lisinopril - Treat persistent hypertension expectantly - We have discussed the importance of taking his medications as they have been prescribed  4.  Elevated troponin--no cp - We will continue to trend troponin levels every 6 hours - Likely secondary to demand  ischemia as a result of acute on chronic CHF -cont asa   Patient started on DVT and PPI prophylaxis  PT to see pt--pt appears deconditioned.  CM for d/c planning to rehab  D/w son Onalee Huadavid on the phone  Case discussed with Care  Management/Social Worker.   CODE STATUS: full  DVT Prophylaxis: heparin  TOTAL TIME TAKING CARE OF THIS PATIENT: *30* minutes.  >50% time spent on counselling and coordination of care  POSSIBLE D/C IN 1-2** DAYS, DEPENDING ON CLINICAL CONDITION.  Note: This dictation was prepared with Dragon dictation along with smaller phrase technology. Any transcriptional errors that result from this process are unintentional.  Enedina FinnerSona Kirt Chew M.D on 12/11/2018 at 2:55 PM  Between 7am to 6pm - Pager - [phone]  After 6pm go to [email or web address] - Social research officer, governmentpassword EPAS [facility]  [facility] [place] [facility]  Office  [phone]  CC: Primary care physician; Kandyce RudBabaoff, Marcus, MDPatient ID: Jerome EssexGordon Craig Raimondo Jr., male   DOB: April 23, 1935, 84 y.o.   MRN: [id number]

## 2018-12-12 LAB — BASIC METABOLIC PANEL
Anion gap: 8 (ref 5–15)
BUN: 25 mg/dL — ABNORMAL HIGH (ref 8–23)
CO2: 33 mmol/L — ABNORMAL HIGH (ref 22–32)
Calcium: 8.1 mg/dL — ABNORMAL LOW (ref 8.9–10.3)
Chloride: 97 mmol/L — ABNORMAL LOW (ref 98–111)
Creatinine, Ser: 1.28 mg/dL — ABNORMAL HIGH (ref 0.61–1.24)
GFR calc Af Amer: >60 mL/min (ref >60)
GFR calc non Af Amer: 52 mL/min — ABNORMAL LOW (ref >60)
Glucose, Bld: 100 mg/dL — ABNORMAL HIGH (ref 70–99)
Potassium: 3.2 mmol/L — ABNORMAL LOW (ref 3.5–5.1)
Sodium: 138 mmol/L (ref 135–145)

## 2018-12-12 LAB — ABO/RH: ABO/RH(D): A NEG

## 2018-12-12 LAB — SARS CORONAVIRUS 2 BY RT PCR (HOSPITAL ORDER, PERFORMED IN ~~LOC~~ HOSPITAL LAB): SARS Coronavirus 2: NEGATIVE

## 2018-12-12 MED ORDER — TRAMADOL HCL 50 MG PO TABS: 50.0000 mg | ORAL_TABLET | Freq: Four times a day (QID) | ORAL | 0 refills | Status: DC | PRN

## 2018-12-12 MED ORDER — FUROSEMIDE 40 MG PO TABS: 40.0000 mg | ORAL_TABLET | Freq: Every day | ORAL | 1 refills | Status: DC

## 2018-12-12 MED ORDER — LISINOPRIL 10 MG PO TABS: 10.0000 mg | ORAL_TABLET | Freq: Two times a day (BID) | ORAL | 1 refills | Status: DC

## 2018-12-12 MED ORDER — POTASSIUM CHLORIDE CRYS ER 20 MEQ PO TBCR
20.0000 meq | EXTENDED_RELEASE_TABLET | Freq: Every day | ORAL | Status: DC
  Administered 2018-12-12: 20 meq via ORAL
  Filled 2018-12-12: qty 1

## 2018-12-12 MED ORDER — FUROSEMIDE 40 MG PO TABS: 40.0000 mg | ORAL_TABLET | Freq: Every day | ORAL | Status: DC

## 2018-12-12 MED ORDER — POTASSIUM CHLORIDE CRYS ER 20 MEQ PO TBCR: 20.0000 meq | EXTENDED_RELEASE_TABLET | Freq: Every day | ORAL | 1 refills | Status: DC

## 2018-12-12 MED ORDER — CARVEDILOL 6.25 MG PO TABS: 6.2500 mg | ORAL_TABLET | Freq: Two times a day (BID) | ORAL | 1 refills | Status: DC

## 2018-12-12 MED ORDER — ADULT MULTIVITAMIN W/MINERALS CH: 1.0000 | ORAL_TABLET | Freq: Every day | ORAL | 1 refills | Status: DC

## 2018-12-12 NOTE — Progress Notes (Signed)
Pt discharged to [facility] via EMS at 1715. Report called to Angela at 1520. Pt was A&Ox4. VSS. Pt left with money from safe. Discharge paperwork given to transport.

## 2018-12-12 NOTE — Discharge Summary (Signed)
[facility] - [place] at [facility]   PATIENT NAME: Jerome [name]    MR#:  [id number]  DATE OF BIRTH:  08/26/1934  DATE OF ADMISSION:  12/08/2018 ADMITTING PHYSICIAN: Hannah BeatJan A Mansy, MD  DATE OF DISCHARGE: 12/12/2018  PRIMARY CARE PHYSICIAN: Kandyce RudBabaoff, Marcus, MD    ADMISSION DIAGNOSIS:  Peripheral edema [R60.9] SOB (shortness of breath) [R06.02] Fall, initial encounter [W19.XXXA] Congestive heart failure, unspecified HF chronicity, unspecified heart failure type (HCC) [I50.9]  DISCHARGE DIAGNOSIS:  Acute on chronic systolic CHF  SECONDARY DIAGNOSIS:   Past Medical History:  Diagnosis Date  . Hypertension     HOSPITAL COURSE:   [name] a84 y.o.malewith a known history of hypertension, gout, renal insufficiency. He presented to the emergency room via EMS services from home complaining of a fall in the bathroom  1.Acute on chronic exacerbation systolic CHF -continue diuresis with Lasix 40 mg IV twice daily--UOP 14.3 liters--change to daily lasix and monitor renal function closely with current creatinine 1.26. -Echocardiogram EF 45-50% -will set up pt for CHF clinic -coreg and low dose lisniopril -DuoNebs every 6 hours as needed for shortness of breath -pt's overall weight trending down  2.Chronic Renal insufficiency III DUE to hypertension -With creatinine 1.26--1.17 improved -We will continue to monitor closely particularly in the setting of exacerbated CHF with diuretic therapy  3. Hypertension -on coreg and lisinopril -We have discussed the importance of taking his medications as they have been prescribed  4. Elevated troponin--no cp -We will continue to trend troponin levels every 6 hours -Likely secondary to demand ischemia as a result of acute on chronic CHF -cont asa   5. Bilateral DJD, knee -prn pain meds and PT  Patient overall improving. He will discharged to rehab today. Care management has left message  for patient's son Onalee HuaDavid.   CONSULTS OBTAINED:    DRUG ALLERGIES:  No Known Allergies  DISCHARGE MEDICATIONS:   Allergies as of 12/12/2018   No Known Allergies     Medication List    STOP taking these medications   atenolol 50 MG tablet Commonly known as: TENORMIN     TAKE these medications   carvedilol 6.25 MG tablet Commonly known as: COREG Take 1 tablet (6.25 mg total) by mouth 2 (two) times daily with a meal.   furosemide 40 MG tablet Commonly known as: LASIX Take 1 tablet (40 mg total) by mouth daily. Start taking on: December 13, 2018   lisinopril 10 MG tablet Commonly known as: ZESTRIL Take 1 tablet (10 mg total) by mouth 2 (two) times daily.   multivitamin with minerals Tabs tablet Take 1 tablet by mouth daily. Start taking on: December 13, 2018   potassium chloride SA 20 MEQ tablet Commonly known as: K-DUR Take 1 tablet (20 mEq total) by mouth daily. Start taking on: December 13, 2018   tamsulosin 0.4 MG Caps capsule Commonly known as: FLOMAX Take 1 capsule by mouth daily. 30 minutes after last meal every evening   traMADol 50 MG tablet Commonly known as: ULTRAM Take 1 tablet (50 mg total) by mouth every 6 (six) hours as needed for moderate pain.       If you experience worsening of your admission symptoms, develop shortness of breath, life threatening emergency, suicidal or homicidal thoughts you must seek medical attention immediately by calling 911 or calling your MD immediately  if symptoms less severe.  You Must read complete instructions/literature along with all the possible adverse reactions/side effects for all the Medicines you  take and that have been prescribed to you. Take any new Medicines after you have completely understood and accept all the possible adverse reactions/side effects.   Please note  You were cared for by a hospitalist during your hospital stay. If you have any questions about your discharge medications or the care you received  while you were in the hospital after you are discharged, you can call the unit and asked to speak with the hospitalist on call if the hospitalist that took care of you is not available. Once you are discharged, your primary care physician will handle any further medical issues. Please note that NO REFILLS for any discharge medications will be authorized once you are discharged, as it is imperative that you return to your primary care physician (or establish a relationship with a primary care physician if you do not have one) for your aftercare needs so that they can reassess your need for medications and monitor your lab values. Today   SUBJECTIVE   I can feel a bit better. No cp, no resp distress  VITAL SIGNS:  Blood pressure (!) 152/74, pulse (!) 56, temperature 98 F (36.7 C), temperature source Oral, resp. rate 16, height 5' 7.99" (1.727 m), weight 115.6 kg, SpO2 95 %.  I/O:    Intake/Output Summary (Last 24 hours) at 12/12/2018 1217 Last data filed at 12/12/2018 1200 Gross per 24 hour  Intake 603 ml  Output 2700 ml  Net -2097 ml    PHYSICAL EXAMINATION:  GENERAL:  84 y.o.-year-old patient lying in the bed with no acute distress. obese EYES: Pupils equal, round, reactive to light and accommodation. No scleral icterus. Extraocular muscles intact.  HEENT: Head atraumatic, normocephalic. Oropharynx and nasopharynx clear.  NECK:  Supple, no jugular venous distention. No thyroid enlargement, no tenderness.  LUNGS: decreased breath sounds bilaterally, no wheezing, rales,rhonchi or crepitation. No use of accessory muscles of respiration.  CARDIOVASCULAR: S1, S2 normal. No murmurs, rubs, or gallops.  ABDOMEN: Soft, non-tender, non-distended. Bowel sounds present. No organomegaly or mass.  EXTREMITIES: + pedal edema No cyanosis, or clubbing.  NEUROLOGIC: Cranial nerves II through XII are intact. Muscle strength 5/5 in all extremities. Sensation intact. Gait not checked.  PSYCHIATRIC:  patient is alert and oriented x 3.  SKIN: No obvious rash, lesion, or ulcer.   DATA REVIEW:   CBC  Recent Labs  Lab 12/09/18 0634  WBC 8.8  HGB 13.4  HCT 41.6  PLT 144*    Chemistries  Recent Labs  Lab 12/12/18 0432  NA 138  K 3.2*  CL 97*  CO2 33*  GLUCOSE 100*  BUN 25*  CREATININE 1.28*  CALCIUM 8.1*    Microbiology Results   Recent Results (from the past 240 hour(s))  SARS Coronavirus 2 (CEPHEID - Performed in [place] hospital lab), Hosp Order     Status: None   Collection Time: 12/08/18 11:11 PM   Specimen: Nasopharyngeal Swab  Result Value Ref Range Status   SARS Coronavirus 2 NEGATIVE NEGATIVE Final    Comment: (NOTE) If result is NEGATIVE SARS-CoV-2 target nucleic acids are NOT DETECTED. The SARS-CoV-2 RNA is generally detectable in upper and lower  respiratory specimens during the acute phase of infection. The lowest  concentration of SARS-CoV-2 viral copies this assay can detect is 250  copies / mL. A negative result does not preclude SARS-CoV-2 infection  and should not be used as the sole basis for treatment or other  patient management decisions.  A negative result may  occur with  improper specimen collection / handling, submission of specimen other  than nasopharyngeal swab, presence of viral mutation(s) within the  areas targeted by this assay, and inadequate number of viral copies  (<250 copies / mL). A negative result must be combined with clinical  observations, patient history, and epidemiological information. If result is POSITIVE SARS-CoV-2 target nucleic acids are DETECTED. The SARS-CoV-2 RNA is generally detectable in upper and lower  respiratory specimens dur ing the acute phase of infection.  Positive  results are indicative of active infection with SARS-CoV-2.  Clinical  correlation with patient history and other diagnostic information is  necessary to determine patient infection status.  Positive results do  not rule out  bacterial infection or co-infection with other viruses. If result is PRESUMPTIVE POSTIVE SARS-CoV-2 nucleic acids MAY BE PRESENT.   A presumptive positive result was obtained on the submitted specimen  and confirmed on repeat testing.  While 2019 novel coronavirus  (SARS-CoV-2) nucleic acids may be present in the submitted sample  additional confirmatory testing may be necessary for epidemiological  and / or clinical management purposes  to differentiate between  SARS-CoV-2 and other Sarbecovirus currently known to infect humans.  If clinically indicated additional testing with an alternate test  methodology [phone]([id number]) is advised. The SARS-CoV-2 RNA is generally  detectable in upper and lower respiratory sp ecimens during the acute  phase of infection. The expected result is Negative. Fact Sheet for Patients:  BoilerBrush.com.cyhttps://www.fda.gov/media/136312/download Fact Sheet for Healthcare Providers: https://pope.com/https://www.fda.gov/media/136313/download This test is not yet approved or cleared by the [place] FDA and has been authorized for detection and/or diagnosis of SARS-CoV-2 by FDA under an Emergency Use Authorization (EUA).  This EUA will remain in effect (meaning this test can be used) for the duration of the COVID-19 declaration under Section 564(b)(1) of the Act, 21 U.S.C. section 360bbb-3(b)(1), unless the authorization is terminated or revoked sooner. Performed at [facility], [address] [place], [place] [address]     RADIOLOGY:  No results found.   CODE STATUS:     Code Status Orders  (From admission, onward)         Start     Ordered   12/09/18 0153  Full code  Continuous     12/09/18 0152        Code Status History    This patient has a current code status but no historical code status.   Advance Care Planning Activity    Advance Directive Documentation     Most Recent Value  Type of Advance Directive  Living will  Pre-existing out of facility DNR  order (yellow form or pink MOST form)  -  "MOST" Form in Place?  -      TOTAL TIME TAKING CARE OF THIS PATIENT: *40* minutes.    Enedina FinnerSona Tyaisha Cullom M.D on 12/12/2018 at 12:17 PM  Between 7am to 6pm - Pager - [phone] After 6pm go to [email or web address] - Social research officer, governmentpassword EPAS [facility]  [facility] [place] [facility]  Office  [phone]  CC: Primary care physician; Kandyce RudBabaoff, Marcus, MD

## 2018-12-12 NOTE — TOC Transition Note (Addendum)
Transition of Care [facility]) - CM/SW Discharge Note   Patient Details  Name: Jerome [name]. MRN: [id number] Date of Birth: [birth year]  Transition of Care [facility]) CM/SW Contact:  Elza Rafter, RN Phone Number: 12/12/2018, 2:24 PM   Clinical Narrative:    Patient is discharging to [facility] today via EMS transport.  They are requiring a new COVID test.  Left message with son [phone].  He will go to room 401; report number is [phone].  Patient is aware.  EMS packet placed on chart with prescriptions.  RN may call EMS once covid results are negative.     Addendum-@1530 -spoke with son Jerome [name] is aware of discharge to [facility]   Final next level of care: [place] Barriers to Discharge: No Barriers Identified   Patient Goals and CMS Choice        Discharge Placement                       Discharge Plan and Services In-house Referral: Clinical Social Work Discharge Planning Services: CM Consult Post Acute Care Choice: Skilled Nursing Facility                               Social Determinants of Health (SDOH) Interventions     Readmission Risk Interventions Readmission Risk Prevention Plan 12/11/2018  Post Dischage Appt Complete  Medication Screening Complete  Transportation Screening Complete  Some recent data might be hidden

## 2018-12-12 NOTE — Care Management Important Message (Signed)
Important Message  Patient Details  Name: Jerome [name]. MRN: [id number] Date of Birth: 04/17/1935   Medicare Important Message Given:  Yes     Dannette Barbara 12/12/2018, 12:08 PM

## 2018-12-13 DIAGNOSIS — M79604 Pain in right leg: Secondary | ICD-10-CM | POA: Insufficient documentation

## 2019-01-02 NOTE — Progress Notes (Deleted)
   Patient ID: Jerome Piccolo., male    DOB: 12/21/1934, 84 y.o.   MRN: [id number]  HPI  Jerome [name] is a 84 y/o male with a history of  Echo report from 12/09/2018 reviewed and showed and EF of 45-50%.   Admitted 12/08/2018 due to acute on chronic HF. Initially needed IV lasix and then transitioned to oral diuretics. UOP was 14.3L. Elevated troponins thought to be due to demand ischemia. Discharged after 4 days.   He presents today for his initial visit with a chief complaint of  Review of Systems    Physical Exam    Assessment & Plan:  1: Chronic heart failure with mildly reduced ejection fraction- - NYHA class - BNP 11/18/2018 was 1581.0  2: HTN- - BP - saw PCP Baldemar Lenis) 08/13/2018 - BMP 12/12/2018 reviewed and showed sodium 138, potassium 3.2, creatinine 1.28 and GFR 52

## 2019-01-06 ENCOUNTER — Ambulatory Visit: Payer: Medicare Other | Admitting: Family

## 2019-01-06 ENCOUNTER — Telehealth: Payer: Self-pay | Admitting: Family

## 2019-01-06 NOTE — Telephone Encounter (Signed)
Patient did not show for his Heart Failure Clinic appointment on 01/06/2019. Will attempt to reschedule.

## 2019-02-11 DIAGNOSIS — N1832 Chronic kidney disease, stage 3b: Secondary | ICD-10-CM | POA: Insufficient documentation

## 2019-02-11 DIAGNOSIS — N183 Chronic kidney disease, stage 3 unspecified: Secondary | ICD-10-CM | POA: Insufficient documentation

## 2019-02-23 ENCOUNTER — Encounter: Payer: Self-pay | Admitting: Emergency Medicine

## 2019-02-23 ENCOUNTER — Other Ambulatory Visit: Payer: Self-pay

## 2019-02-23 ENCOUNTER — Emergency Department
Admission: EM | Admit: 2019-02-23 | Discharge: 2019-02-23 | Disposition: A | Discharge status: Home / Self Care | Payer: Medicare Other | Attending: Emergency Medicine | Admitting: Emergency Medicine

## 2019-02-23 DIAGNOSIS — R4182 Altered mental status, unspecified: Secondary | ICD-10-CM | POA: Diagnosis not present

## 2019-02-23 DIAGNOSIS — I10 Essential (primary) hypertension: Secondary | ICD-10-CM | POA: Insufficient documentation

## 2019-02-23 DIAGNOSIS — Z79899 Other long term (current) drug therapy: Secondary | ICD-10-CM | POA: Insufficient documentation

## 2019-02-23 LAB — URINALYSIS, COMPLETE (UACMP) WITH MICROSCOPIC
Bacteria, UA: NONE SEEN
Bilirubin Urine: NEGATIVE
Glucose, UA: NEGATIVE mg/dL
Hgb urine dipstick: NEGATIVE
Ketones, ur: NEGATIVE mg/dL
Nitrite: NEGATIVE
Protein, ur: NEGATIVE mg/dL
Specific Gravity, Urine: 1.008 (ref 1.005–1.030)
pH: 5 (ref 5.0–8.0)

## 2019-02-23 LAB — CBC WITH DIFFERENTIAL/PLATELET
Abs Immature Granulocytes: 0.03 10*3/uL (ref 0.00–0.07)
Basophils Absolute: 0 10*3/uL (ref 0.0–0.1)
Basophils Relative: 1 %
Eosinophils Absolute: 0.1 10*3/uL (ref 0.0–0.5)
Eosinophils Relative: 2 %
HCT: 34.7 % — ABNORMAL LOW (ref 39.0–52.0)
Hemoglobin: 11.3 g/dL — ABNORMAL LOW (ref 13.0–17.0)
Immature Granulocytes: 1 %
Lymphocytes Relative: 13 %
Lymphs Abs: 0.8 10*3/uL (ref 0.7–4.0)
MCH: 31.3 pg (ref 26.0–34.0)
MCHC: 32.6 g/dL (ref 30.0–36.0)
MCV: 96.1 fL (ref 80.0–100.0)
Monocytes Absolute: 0.4 10*3/uL (ref 0.1–1.0)
Monocytes Relative: 6 %
Neutro Abs: 4.6 10*3/uL (ref 1.7–7.7)
Neutrophils Relative %: 77 %
Platelets: 135 10*3/uL — ABNORMAL LOW (ref 150–400)
RBC: 3.61 MIL/uL — ABNORMAL LOW (ref 4.22–5.81)
RDW: 15.1 % (ref 11.5–15.5)
WBC: 5.9 10*3/uL (ref 4.0–10.5)
nRBC: 0 % (ref 0.0–0.2)

## 2019-02-23 LAB — BASIC METABOLIC PANEL
Anion gap: 9 (ref 5–15)
BUN: 36 mg/dL — ABNORMAL HIGH (ref 8–23)
CO2: 25 mmol/L (ref 22–32)
Calcium: 8.9 mg/dL (ref 8.9–10.3)
Chloride: 104 mmol/L (ref 98–111)
Creatinine, Ser: 1.83 mg/dL — ABNORMAL HIGH (ref 0.61–1.24)
GFR calc Af Amer: 39 mL/min — ABNORMAL LOW (ref >60)
GFR calc non Af Amer: 34 mL/min — ABNORMAL LOW (ref >60)
Glucose, Bld: 145 mg/dL — ABNORMAL HIGH (ref 70–99)
Potassium: 4.4 mmol/L (ref 3.5–5.1)
Sodium: 138 mmol/L (ref 135–145)

## 2019-02-23 MED ORDER — SODIUM CHLORIDE 0.9 % IV BOLUS
1000.0000 mL | Freq: Once | INTRAVENOUS | Status: AC
  Administered 2019-02-23: 20:00:00 1000 mL via INTRAVENOUS

## 2019-02-23 NOTE — ED Provider Notes (Signed)
[facility] Emergency Department Provider Note  ____________________________________________   I have reviewed the triage vital signs and the nursing notes.   HISTORY  Chief Complaint Altered Mental Status   History limited by: Confusion   HPI Jerome EssexGordon Craig Mcisaac Jr. is a 84 y.o. male who presents to the emergency department today via EMS because of concern for unresponsiveness. Per report the patient was found by the son unresponsive. Apparently the patient is on tramadol for hip pain and recently started gabapentin. When EMS arrived on scene the patient the patient was unresponsive with pinpoint pupils. Patient was given narcan with quick effect. Patient then became combative and agitated.   Records reviewed. Per medical record review patient has a history of HTN, CHF. On tramadol.  Past Medical History:  Diagnosis Date  . Hypertension     Patient Active Problem List   Diagnosis Date Noted  . Acute exacerbation of CHF (congestive heart failure) (HCC) 12/09/2018    Past Surgical History:  Procedure Laterality Date  . APPENDECTOMY    . HERNIA REPAIR      Prior to Admission medications   Medication Sig Start Date End Date Taking? Authorizing Provider  carvedilol (COREG) 6.25 MG tablet Take 1 tablet (6.25 mg total) by mouth 2 (two) times daily with a meal. 12/12/18   Enedina FinnerPatel, Sona, MD  furosemide (LASIX) 40 MG tablet Take 1 tablet (40 mg total) by mouth daily. 12/13/18   Enedina FinnerPatel, Sona, MD  lisinopril (ZESTRIL) 10 MG tablet Take 1 tablet (10 mg total) by mouth 2 (two) times daily. 12/12/18   Enedina FinnerPatel, Sona, MD  Multiple Vitamin (MULTIVITAMIN WITH MINERALS) TABS tablet Take 1 tablet by mouth daily. 12/13/18   Enedina FinnerPatel, Sona, MD  potassium chloride SA (K-DUR) 20 MEQ tablet Take 1 tablet (20 mEq total) by mouth daily. 12/13/18   Enedina FinnerPatel, Sona, MD  tamsulosin (FLOMAX) 0.4 MG CAPS capsule Take 1 capsule by mouth daily. 30 minutes after last meal every evening 09/16/18 09/16/19   [provider]  traMADol (ULTRAM) 50 MG tablet Take 1 tablet (50 mg total) by mouth every 6 (six) hours as needed for moderate pain. 12/12/18   Enedina FinnerPatel, Sona, MD    Allergies Patient has no known allergies.  No family history on file.  Social History Social History   Tobacco Use  . Smoking status: Never Smoker  . Smokeless tobacco: Never Used  Substance Use Topics  . Alcohol use: Never    Frequency: Never  . Drug use: Never    Review of Systems Constitutional: No fever/chills Eyes: No visual changes. ENT: No sore throat. Cardiovascular: Denies chest pain. Respiratory: Denies shortness of breath. Gastrointestinal: No abdominal pain.  No nausea, no vomiting.  No diarrhea.   Genitourinary: Negative for dysuria. Musculoskeletal: Positive for bilateral hip pain. Skin: Negative for rash. Neurological: Negative for headaches, focal weakness or numbness.  ____________________________________________   PHYSICAL EXAM:  VITAL SIGNS: ED Triage Vitals  Enc Vitals Group     BP 02/23/19 1842 124/70     Pulse Rate 02/23/19 1842 61     Resp 02/23/19 1842 17     Temp 02/23/19 1842 97.7 F (36.5 C)     Temp Source 02/23/19 1842 Oral     SpO2 02/23/19 1842 94 %     Weight --      Height --      Head Circumference --      Peak Flow --      Pain Score 02/23/19 1845  3    Constitutional: Awake and alert, slightly agitated.  Eyes: Conjunctivae are normal.  ENT      Head: Normocephalic and atraumatic.      Nose: No congestion/rhinnorhea.      Mouth/Throat: Mucous membranes are moist.      Neck: No stridor. Hematological/Lymphatic/Immunilogical: No cervical lymphadenopathy. Cardiovascular: Normal rate, regular rhythm.  No murmurs, rubs, or gallops.  Respiratory: Normal respiratory effort without tachypnea nor retractions. Breath sounds are clear and equal bilaterally. No wheezes/rales/rhonchi. Gastrointestinal: Soft and non tender. No rebound. No guarding.   Genitourinary: Deferred Musculoskeletal: Normal range of motion in all extremities. No lower extremity edema. Neurologic:  Awake, alert. Not completely oriented to events.  Skin:  Skin is warm, dry and intact. No rash noted. Psychiatric: Slightly agitated.  ____________________________________________    LABS (pertinent positives/negatives)  CBC wbc 5.9, hgb 11.3, plt 135 BMP na 138, k 4.4, glu 145, cr 1.83 UA clear, trace leukocytes, 0-5 wbc and rbc  ____________________________________________   EKG  I, Nance Pear, attending physician, personally viewed and interpreted this EKG  EKG Time: 1837 Rate: 64 Rhythm: sinus rhythm with 1st degree av block Axis: normal Intervals: qtc 447 QRS: narrow ST changes: narrow Impression: abnormal ekg   ____________________________________________    RADIOLOGY  None  ____________________________________________   PROCEDURES  Procedures  ____________________________________________   INITIAL IMPRESSION / ASSESSMENT AND PLAN / ED COURSE  Pertinent labs & imaging results that were available during my care of the patient were reviewed by me and considered in my medical decision making (see chart for details).   Patient presents to the emergency department today after an episode of unresponsiveness..  MS patient did have pinpoint pupils upon their arrival.  Patient responded quickly to Narcan.  Patient is on tramadol and recently started gabapentin.  Patient was observed in the emergency department for a number of hours without any further episodes of decreased responsiveness.  At this time I do think medications likely cause the patient's episode of unresponsiveness given good response to Narcan.  Discussed this with patient and family.  ____________________________________________   FINAL CLINICAL IMPRESSION(S) / ED DIAGNOSES  Final diagnoses:  Altered mental status, unspecified altered mental status type      Note: This dictation was prepared with Dragon dictation. Any transcriptional errors that result from this process are unintentional     Nance Pear, MD 02/24/19 1541

## 2019-02-23 NOTE — Discharge Instructions (Addendum)
Please seek medical attention for any high fevers, chest pain, shortness of breath, change in behavior, persistent vomiting, bloody stool or any other new or concerning symptoms.  

## 2019-02-23 NOTE — ED Notes (Signed)
Contact Pt's son, Lesly Joslyn [phone].

## 2019-02-23 NOTE — ED Triage Notes (Signed)
Pt arrived from home via [facility]. Pt was found unresponsive by son. EMS administered 1/2mg  Narcan and pt became responsive and combative. Pt also received 1.5 Versed and 500 ml bolus from PTA.

## 2019-02-24 NOTE — ED Notes (Signed)
EDP Archie Balboa spoke with pt's son about pt's condition and informed him that he is being discharged and he can come and pick the pt up. Pt placed in wheelchair by this RN and Wilfred Lacy, [place]. Pt then wheel to lobby to await ride. No issues on transportation to lobby.

## 2019-03-20 ENCOUNTER — Emergency Department: Payer: Medicare Other

## 2019-03-20 ENCOUNTER — Other Ambulatory Visit: Payer: Self-pay

## 2019-03-20 ENCOUNTER — Inpatient Hospital Stay
Admission: EM | Admit: 2019-03-20 | Discharge: 2019-03-25 | DRG: 872 | Disposition: A | Discharge status: Hospice | Payer: Medicare Other | Attending: Internal Medicine | Admitting: Internal Medicine

## 2019-03-20 DIAGNOSIS — N183 Chronic kidney disease, stage 3 unspecified: Secondary | ICD-10-CM | POA: Diagnosis present

## 2019-03-20 DIAGNOSIS — I5022 Chronic systolic (congestive) heart failure: Secondary | ICD-10-CM | POA: Diagnosis present

## 2019-03-20 DIAGNOSIS — Z9049 Acquired absence of other specified parts of digestive tract: Secondary | ICD-10-CM

## 2019-03-20 DIAGNOSIS — K529 Noninfective gastroenteritis and colitis, unspecified: Secondary | ICD-10-CM | POA: Diagnosis present

## 2019-03-20 DIAGNOSIS — Z20828 Contact with and (suspected) exposure to other viral communicable diseases: Secondary | ICD-10-CM | POA: Diagnosis present

## 2019-03-20 DIAGNOSIS — R410 Disorientation, unspecified: Secondary | ICD-10-CM | POA: Diagnosis present

## 2019-03-20 DIAGNOSIS — A419 Sepsis, unspecified organism: Principal | ICD-10-CM | POA: Diagnosis present

## 2019-03-20 DIAGNOSIS — R55 Syncope and collapse: Secondary | ICD-10-CM

## 2019-03-20 DIAGNOSIS — E860 Dehydration: Secondary | ICD-10-CM | POA: Diagnosis present

## 2019-03-20 DIAGNOSIS — Z79899 Other long term (current) drug therapy: Secondary | ICD-10-CM | POA: Diagnosis not present

## 2019-03-20 DIAGNOSIS — I2510 Atherosclerotic heart disease of native coronary artery without angina pectoris: Secondary | ICD-10-CM | POA: Diagnosis present

## 2019-03-20 DIAGNOSIS — N179 Acute kidney failure, unspecified: Secondary | ICD-10-CM | POA: Diagnosis present

## 2019-03-20 DIAGNOSIS — Z23 Encounter for immunization: Secondary | ICD-10-CM | POA: Diagnosis not present

## 2019-03-20 DIAGNOSIS — D649 Anemia, unspecified: Secondary | ICD-10-CM | POA: Diagnosis present

## 2019-03-20 DIAGNOSIS — I130 Hypertensive heart and chronic kidney disease with heart failure and stage 1 through stage 4 chronic kidney disease, or unspecified chronic kidney disease: Secondary | ICD-10-CM | POA: Diagnosis present

## 2019-03-20 DIAGNOSIS — I10 Essential (primary) hypertension: Secondary | ICD-10-CM | POA: Diagnosis present

## 2019-03-20 DIAGNOSIS — N189 Chronic kidney disease, unspecified: Secondary | ICD-10-CM | POA: Diagnosis present

## 2019-03-20 DIAGNOSIS — R6520 Severe sepsis without septic shock: Secondary | ICD-10-CM | POA: Diagnosis present

## 2019-03-20 DIAGNOSIS — R112 Nausea with vomiting, unspecified: Secondary | ICD-10-CM

## 2019-03-20 HISTORY — DX: Chronic systolic (congestive) heart failure: I50.22

## 2019-03-20 HISTORY — DX: Atherosclerotic heart disease of native coronary artery without angina pectoris: I25.10

## 2019-03-20 LAB — COMPREHENSIVE METABOLIC PANEL
ALT: 26 U/L (ref 0–44)
AST: 37 U/L (ref 15–41)
Albumin: 3 g/dL — ABNORMAL LOW (ref 3.5–5.0)
Alkaline Phosphatase: 54 U/L (ref 38–126)
Anion gap: 13 (ref 5–15)
BUN: 44 mg/dL — ABNORMAL HIGH (ref 8–23)
CO2: 22 mmol/L (ref 22–32)
Calcium: 9.1 mg/dL (ref 8.9–10.3)
Chloride: 102 mmol/L (ref 98–111)
Creatinine, Ser: 2.53 mg/dL — ABNORMAL HIGH (ref 0.61–1.24)
GFR calc Af Amer: 26 mL/min — ABNORMAL LOW (ref >60)
GFR calc non Af Amer: 23 mL/min — ABNORMAL LOW (ref >60)
Glucose, Bld: 137 mg/dL — ABNORMAL HIGH (ref 70–99)
Potassium: 5.1 mmol/L (ref 3.5–5.1)
Sodium: 137 mmol/L (ref 135–145)
Total Bilirubin: 0.9 mg/dL (ref 0.3–1.2)
Total Protein: 6.9 g/dL (ref 6.5–8.1)

## 2019-03-20 LAB — CBC WITH DIFFERENTIAL/PLATELET
Abs Immature Granulocytes: 0.05 10*3/uL (ref 0.00–0.07)
Basophils Absolute: 0.1 10*3/uL (ref 0.0–0.1)
Basophils Relative: 1 %
Eosinophils Absolute: 0.1 10*3/uL (ref 0.0–0.5)
Eosinophils Relative: 1 %
HCT: 34.6 % — ABNORMAL LOW (ref 39.0–52.0)
Hemoglobin: 11.2 g/dL — ABNORMAL LOW (ref 13.0–17.0)
Immature Granulocytes: 1 %
Lymphocytes Relative: 14 %
Lymphs Abs: 1.2 10*3/uL (ref 0.7–4.0)
MCH: 32 pg (ref 26.0–34.0)
MCHC: 32.4 g/dL (ref 30.0–36.0)
MCV: 98.9 fL (ref 80.0–100.0)
Monocytes Absolute: 0.6 10*3/uL (ref 0.1–1.0)
Monocytes Relative: 7 %
Neutro Abs: 6.6 10*3/uL (ref 1.7–7.7)
Neutrophils Relative %: 76 %
Platelets: 277 10*3/uL (ref 150–400)
RBC: 3.5 MIL/uL — ABNORMAL LOW (ref 4.22–5.81)
RDW: 14.8 % (ref 11.5–15.5)
WBC: 8.7 10*3/uL (ref 4.0–10.5)
nRBC: 0 % (ref 0.0–0.2)

## 2019-03-20 LAB — C DIFFICILE QUICK SCREEN W PCR REFLEX
C Diff antigen: NEGATIVE
C Diff interpretation: NOT DETECTED
C Diff toxin: NEGATIVE

## 2019-03-20 LAB — LACTIC ACID, PLASMA
Lactic Acid, Venous: 4.2 mmol/L (ref 0.5–1.9)
Lactic Acid, Venous: 2.6 mmol/L (ref 0.5–1.9)

## 2019-03-20 LAB — PROTIME-INR
INR: 1.2 (ref 0.8–1.2)
Prothrombin Time: 14.9 seconds (ref 11.4–15.2)

## 2019-03-20 LAB — APTT: aPTT: 27 seconds (ref 24–36)

## 2019-03-20 LAB — SARS CORONAVIRUS 2 BY RT PCR (HOSPITAL ORDER, PERFORMED IN ~~LOC~~ HOSPITAL LAB): SARS Coronavirus 2: NEGATIVE

## 2019-03-20 MED ORDER — METRONIDAZOLE IN NACL 5-0.79 MG/ML-% IV SOLN
500.0000 mg | Freq: Once | INTRAVENOUS | Status: AC
  Administered 2019-03-20: 500 mg via INTRAVENOUS
  Filled 2019-03-20: qty 100

## 2019-03-20 MED ORDER — VANCOMYCIN HCL 10 G IV SOLR
2000.0000 mg | Freq: Once | INTRAVENOUS | Status: AC
  Administered 2019-03-20: 20:00:00 2000 mg via INTRAVENOUS
  Filled 2019-03-20: qty 2000

## 2019-03-20 MED ORDER — SODIUM CHLORIDE 0.9 % IV BOLUS
500.0000 mL | Freq: Once | INTRAVENOUS | Status: AC
  Administered 2019-03-20: 500 mL via INTRAVENOUS

## 2019-03-20 MED ORDER — SODIUM CHLORIDE 0.9 % IV BOLUS: 1000.0000 mL | Freq: Once | INTRAVENOUS | Status: DC

## 2019-03-20 MED ORDER — NOREPINEPHRINE 4 MG/250ML-% IV SOLN: 0.0000 ug/min | INTRAVENOUS | Status: DC

## 2019-03-20 MED ORDER — SODIUM CHLORIDE 0.9 % IV BOLUS
1000.0000 mL | Freq: Once | INTRAVENOUS | Status: AC
  Administered 2019-03-20: 1000 mL via INTRAVENOUS

## 2019-03-20 MED ORDER — VANCOMYCIN HCL IN DEXTROSE 1-5 GM/200ML-% IV SOLN: 1000.0000 mg | Freq: Once | INTRAVENOUS | Status: DC

## 2019-03-20 MED ORDER — SODIUM CHLORIDE 0.9 % IV SOLN
2.0000 g | Freq: Once | INTRAVENOUS | Status: AC
  Administered 2019-03-20: 2 g via INTRAVENOUS
  Filled 2019-03-20: qty 2

## 2019-03-20 MED ORDER — SODIUM CHLORIDE 0.9 % IV BOLUS
1000.0000 mL | Freq: Once | INTRAVENOUS | Status: AC
  Administered 2019-03-20: 17:00:00 1000 mL via INTRAVENOUS

## 2019-03-20 NOTE — ED Notes (Signed)
Patient transported to CT 

## 2019-03-20 NOTE — ED Triage Notes (Signed)
PT from home via EMS with c/o vasal vagal response while on toilet today with + LOC. Pt was unresponsive upon arrival per EMS with BP in the 60s. Pt was given fluids and became more verbal. PT is alert and responding to pain. PT arrives in emesis and stool. BP 67/47

## 2019-03-20 NOTE — ED Notes (Signed)
Called Ct and told them pt was ready

## 2019-03-20 NOTE — ED Provider Notes (Signed)
[facility] Emergency Department Provider Note  ____________________________________________   First MD Initiated Contact with Patient 03/20/19 1717     (approximate)  I have reviewed the triage vital signs and the nursing notes.   HISTORY  Chief Complaint Loss of Consciousness    HPI Jerome [name]. is a 84 y.o. male 84 year old male with history of hypertension, CHF, reportedly on hospice, but full code with full treatment scope, here with need to have a bowel movement.  Patient arrives confused.  Per report, EMS was called out to the scene today due to profound hypotension and confusion.  The patient was trying have a bowel movement then became confused and began vomiting.  He has complained of diffuse abdominal pain and the need to have a bowel movement since then.  He has vomited multiple times with EMS.  On arrival, patient confused, with slurred speech.  He endorses significant abdominal pain and sensation that he is having bowel movement.  Remainder of history limited due to confusion.  Level 5 caveat invoked as remainder of history, ROS, and physical exam limited due to patient's confusion.         Past Medical History:  Diagnosis Date   CAD (coronary artery disease)    Chronic systolic CHF (congestive heart failure) (HCC)    Hypertension     Patient Active Problem List   Diagnosis Date Noted   Sepsis (HCC) 03/20/2019   Chronic systolic CHF (congestive heart failure) (HCC) 03/20/2019   CAD (coronary artery disease) 03/20/2019   HTN (hypertension) 03/20/2019   Acute on chronic renal failure (HCC) 03/20/2019   Severe sepsis (HCC) 03/20/2019   Acute exacerbation of CHF (congestive heart failure) (HCC) 12/09/2018    Past Surgical History:  Procedure Laterality Date   APPENDECTOMY     HERNIA REPAIR      Prior to Admission medications   Medication Sig Start Date End Date Taking? Authorizing Provider  carvedilol  (COREG) 6.25 MG tablet Take 1 tablet (6.25 mg total) by mouth 2 (two) times daily with a meal. 12/12/18  Yes Enedina Finner, MD  furosemide (LASIX) 40 MG tablet Take 1 tablet (40 mg total) by mouth daily. 12/13/18  Yes Enedina Finner, MD  gabapentin (NEURONTIN) 100 MG capsule Take 100 mg by mouth 3 (three) times daily. 03/11/19  Yes [provider]  lisinopril (ZESTRIL) 10 MG tablet Take 1 tablet (10 mg total) by mouth 2 (two) times daily. Patient taking differently: Take 10 mg by mouth daily.  12/12/18  Yes Enedina Finner, MD  Multiple Vitamin (MULTIVITAMIN WITH MINERALS) TABS tablet Take 1 tablet by mouth daily. 12/13/18  Yes Enedina Finner, MD  potassium chloride SA (K-DUR) 20 MEQ tablet Take 1 tablet (20 mEq total) by mouth daily. 12/13/18  Yes Enedina Finner, MD  tamsulosin (FLOMAX) 0.4 MG CAPS capsule Take 1 capsule by mouth daily. 30 minutes after last meal every evening 09/16/18 09/16/19 Yes [provider]  traMADol (ULTRAM) 50 MG tablet Take 1 tablet (50 mg total) by mouth every 6 (six) hours as needed for moderate pain. Patient taking differently: Take 50 mg by mouth 2 (two) times daily.  12/12/18  Yes Enedina Finner, MD    Allergies Patient has no known allergies.  No family history on file.  Social History Social History   Tobacco Use   Smoking status: Never Smoker   Smokeless tobacco: Never Used  Substance Use Topics   Alcohol use: Never    Frequency: Never  Drug use: Never    Review of Systems  Review of Systems  Unable to perform ROS: Dementia  Constitutional: Positive for fatigue. Negative for chills and fever.  HENT: Negative for sore throat.   Respiratory: Negative for shortness of breath.   Cardiovascular: Negative for chest pain.  Gastrointestinal: Positive for nausea and vomiting. Negative for abdominal pain.  Genitourinary: Negative for flank pain.  Musculoskeletal: Negative for neck pain.  Skin: Negative for rash and wound.  Allergic/Immunologic: Negative  for immunocompromised state.  Neurological: Positive for weakness. Negative for numbness.  Hematological: Does not bruise/bleed easily.  Psychiatric/Behavioral: Positive for confusion.  All other systems reviewed and are negative.    ____________________________________________  PHYSICAL EXAM:      VITAL SIGNS: ED Triage Vitals  Enc Vitals Group     BP 03/20/19 1700 (!) 44/33     Pulse Rate 03/20/19 1700 64     Resp 03/20/19 1706 18     Temp --      Temp src --      SpO2 03/20/19 1700 99 %     Weight 03/20/19 1751 230 lb (104.3 kg)     Height 03/20/19 1751 5\' 9"  (1.753 m)     Head Circumference --      Peak Flow --      Pain Score 03/20/19 1751 10     Pain Loc --      Pain Edu? --      Excl. in GC? --      Physical Exam Vitals signs and nursing note reviewed.  Constitutional:      General: He is not in acute distress.    Appearance: He is well-developed. He is ill-appearing and diaphoretic.  HENT:     Head: Normocephalic and atraumatic.     Mouth/Throat:     Mouth: Mucous membranes are dry.  Eyes:     Conjunctiva/sclera: Conjunctivae normal.  Neck:     Musculoskeletal: Neck supple.  Cardiovascular:     Rate and Rhythm: Regular rhythm. Bradycardia present.     Heart sounds: Normal heart sounds. No murmur. No friction rub.  Pulmonary:     Effort: Pulmonary effort is normal. No respiratory distress.     Breath sounds: Normal breath sounds. No wheezing or rales.  Abdominal:     General: There is distension.     Palpations: Abdomen is soft.     Tenderness: There is abdominal tenderness.  Skin:    General: Skin is warm.     Capillary Refill: Capillary refill takes less than 2 seconds.     Findings: No rash.  Neurological:     Mental Status: He is alert and oriented to person, place, and time.     Motor: No abnormal muscle tone.       ____________________________________________   LABS (all labs ordered are listed, but only abnormal results are  displayed)  Labs Reviewed  LACTIC ACID, PLASMA - Abnormal; Notable for the following components:      Result Value   Lactic Acid, Venous 4.2 (*)    All other components within normal limits  LACTIC ACID, PLASMA - Abnormal; Notable for the following components:   Lactic Acid, Venous 2.6 (*)    All other components within normal limits  COMPREHENSIVE METABOLIC PANEL - Abnormal; Notable for the following components:   Glucose, Bld 137 (*)    BUN 44 (*)    Creatinine, Ser 2.53 (*)    Albumin 3.0 (*)    GFR  calc non Af Amer 23 (*)    GFR calc Af Amer 26 (*)    All other components within normal limits  CBC WITH DIFFERENTIAL/PLATELET - Abnormal; Notable for the following components:   RBC 3.50 (*)    Hemoglobin 11.2 (*)    HCT 34.6 (*)    All other components within normal limits  C DIFFICILE QUICK SCREEN W PCR REFLEX  SARS CORONAVIRUS 2 (HOSPITAL ORDER, PERFORMED IN [facility])  CULTURE, BLOOD (ROUTINE X 2)  CULTURE, BLOOD (ROUTINE X 2)  URINE CULTURE  APTT  PROTIME-INR  URINALYSIS, ROUTINE W REFLEX MICROSCOPIC  GI PATHOGEN PANEL BY PCR, STOOL    ____________________________________________  EKG: Sinus rhythm, ventricular rate 60.  QRS 99, QTc 452.  RSR prime noted.  No acute ST elevations or depressions. ________________________________________  RADIOLOGY All imaging, including plain films, CT scans, and ultrasounds, independently reviewed by me, and interpretations confirmed via formal radiology reads.  ED MD interpretation:   CT head: Negative CT abdomen pelvis: Fluid-filled bowel consistent with likely enterocolitis, possible bladder thickening, inflammatory bronchiolitis  Official radiology report(s): Ct Abdomen Pelvis Wo Contrast  Result Date: 03/20/2019 CLINICAL DATA:  Abdominal distension, nausea, vomiting EXAM: CT ABDOMEN AND PELVIS WITHOUT CONTRAST TECHNIQUE: Multidetector CT imaging of the abdomen and pelvis was performed following the standard  protocol without IV contrast. COMPARISON:  Same-day radiograph of the chest FINDINGS: Lower chest: Few tree-in-bud opacities are present in the periphery of the left lung base. Atherosclerotic calcification of the coronary arteries. Normal heart size. No pericardial effusion. Hepatobiliary: Punctate calcification in the right lobe liver. No concerning hepatic lesions. Gallbladder largely decompressed. No frank wall thickening or visible calcified gallstones. No biliary ductal dilatation. Pancreas: Unremarkable. No pancreatic ductal dilatation or surrounding inflammatory changes. Spleen: Normal in size without focal abnormality. Adrenals/Urinary Tract: Normal adrenal glands. Few nonobstructing calcifications are present in the lower poles of both kidney. No obstructive urolithiasis or hydronephrosis. Several partially exophytic fluid attenuation cysts are seen in both kidneys, largest on the right measures approximately 4 cm, largest on the left measures approximately 3.7 cm no concerning renal lesions. Mild mural thickening of the bladder is slightly greater than expected for the degree of distention. Stomach/Bowel: Distal esophagus stomach and duodenum are unremarkable. Multiple loops of fluid-filled small bowel. Much of the colon is fluid-filled as well with some pericolonic hazy stranding most pronounced in the distal sigmoid. Of the segment does appear to be under active peristalsis. The appendix is surgically absent. Vascular/Lymphatic: Atherosclerotic plaque within the normal caliber aorta. No suspicious or enlarged lymph nodes in the included lymphatic chains. Reproductive: Coarse eccentric calcification of the borderline enlarged prostate. No concerning abnormalities of the prostate or seminal vesicles. Other: No abdominopelvic free fluid or free gas. No bowel containing hernias. Bilateral fat containing inguinal hernias are noted. Musculoskeletal: Multilevel degenerative changes are present in the imaged  portions of the spine. Features are most severe at the L5 level grade 1 anterolisthesis of L4 on L5 noted as well. Mild wedging of several contiguous lower thoracic vertebrae appears to be on a chronic, likely degenerative basis. IMPRESSION: 1. Fluid-filled small and large bowel with some hazy pericolonic stranding. Correlate with clinical symptoms of enterocolitis 2. Mild mural thickening of the bladder is slightly greater than expected for the degree of distention. Could reflect a mild cystitis or chronic bladder outlet obstruction due to a borderline enlarged prostate. 3. Few tree-in-bud opacities in the periphery of the left lung base, suggestive of infectious or  inflammatory bronchiolitis. 4. Aortic Atherosclerosis (ICD10-I70.0). Electronically Signed   By: Kreg Shropshire M.D.   On: 03/20/2019 19:20   Ct Head Wo Contrast  Result Date: 03/20/2019 CLINICAL DATA:  Altered level of consciousness, unexplained EXAM: CT HEAD WITHOUT CONTRAST TECHNIQUE: Contiguous axial images were obtained from the base of the skull through the vertex without intravenous contrast. COMPARISON:  None. 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 Multiple benign-appearing dural calcifications. No evidence of acute infarction, hemorrhage, hydrocephalus, extra-axial collection or mass lesion/mass effect. Symmetric prominence of the ventricles, cisterns and sulci compatible with parenchymal volume loss. Patchy areas of white matter hypoattenuation are most compatible with chronic microvascular angiopathy. Vascular: Atherosclerotic calcification of the carotid siphons and intradural vertebral arteries. No hyperdense vessel. Skull: No calvarial fracture or suspicious osseous lesion. No scalp swelling or hematoma. Sinuses/Orbits: Paranasal sinuses and mastoid air cells are predominantly clear. Included orbital structures are unremarkable. Other: Edentulous with mandibular prognathism best appreciated on scout view IMPRESSION: No  acute intracranial abnormality. Electronically Signed   By: Kreg Shropshire M.D.   On: 03/20/2019 19:22   Dg Chest Port 1 View  Result Date: 03/20/2019 CLINICAL DATA:  PT from home via EMS with c/o vasal vagal response while on toilet today with + LOC. Pt was unresponsive upon arrival per EMS with BP in the 60s. Pt was given fluids and became more verbal. PT is alert and responding to pain. PT arrives in emesis and stool. BP 67/47 EXAM: PORTABLE CHEST 1 VIEW COMPARISON:  12/08/2018 FINDINGS: Cardiac silhouette is normal in size. No mediastinal or hilar masses. No evidence of adenopathy. Lungs are clear.  No pleural effusion or pneumothorax. Skeletal structures are grossly intact. IMPRESSION: No active disease. Electronically Signed   By: Amie Portland M.D.   On: 03/20/2019 18:58    ____________________________________________  PROCEDURES   Procedure(s) performed (including Critical Care):  .Critical Care Performed by: Shaune Pollack, MD Authorized by: Shaune Pollack, MD   Critical care provider statement:    Critical care time (minutes):  35   Critical care time was exclusive of:  Separately billable procedures and treating other patients and teaching time   Critical care was necessary to treat or prevent imminent or life-threatening deterioration of the following conditions:  Circulatory failure, cardiac failure, respiratory failure and sepsis   Critical care was time spent personally by me on the following activities:  Development of treatment plan with patient or surrogate, discussions with consultants, evaluation of patient's response to treatment, examination of patient, obtaining history from patient or surrogate, ordering and performing treatments and interventions, ordering and review of laboratory studies, ordering and review of radiographic studies, pulse oximetry, re-evaluation of patient's condition and review of old charts   I assumed direction of critical care for this patient  from another provider in my specialty: no      ____________________________________________  INITIAL IMPRESSION / MDM / ASSESSMENT AND PLAN / ED COURSE  As part of my medical decision making, I reviewed the following data within the electronic MEDICAL RECORD NUMBER       *Jerome [name]. was evaluated in Emergency Department on 03/20/2019 for the symptoms described in the history of present illness. He was evaluated in the context of the global COVID-19 pandemic, which necessitated consideration that the patient might be at risk for infection with the SARS-CoV-2 virus that causes COVID-19. Institutional protocols and algorithms that pertain to the evaluation of patients at risk for COVID-19 are in a state of rapid  change based on information released by regulatory bodies including the CDC and federal and state organizations. These policies and algorithms were followed during the patient's care in the ED.  Some ED evaluations and interventions may be delayed as a result of limited staffing during the pandemic.*      Medical Decision Making: 84 year old here with severe hypotension, diarrhea, and confusion.  Patient markedly hypotensive on arrival, confused, and with diffuse abdominal tenderness and significant bowel movement.  Suspect severe sepsis secondary to possible colitis.  Unclear whether patient is hospice, but he states he would desire full care and paperwork confirms this.  Sepsis protocol initiated with fluids, though will be cautious in the setting of known significant CHF.  Labs show significant AKI, lactic acidosis, and CT scan consistent with diffuse enterocolitis.  Blood pressure markedly improved with fluids, and patient is mentating much better.  Broad-spectrum antibiotics have been given.  GI panel sent.  C. difficile negative.  His abdominal pain is resolved, and the diffuse nature of it makes an ischemic colitis less likely, though I do suspect he likely has some  hypoperfusion related to his hypotension.  Admit to medicine.  ____________________________________________  FINAL CLINICAL IMPRESSION(S) / ED DIAGNOSES  Final diagnoses:  Enterocolitis  Severe sepsis (HCC)  AKI (acute kidney injury) ([place])     MEDICATIONS GIVEN DURING THIS VISIT:  Medications  sodium chloride 0.9 % bolus 1,000 mL (0 mLs Intravenous Stopped 03/20/19 1748)  sodium chloride 0.9 % bolus 1,000 mL (0 mLs Intravenous Stopped 03/20/19 1748)  ceFEPIme (MAXIPIME) 2 g in sodium chloride 0.9 % 100 mL IVPB (0 g Intravenous Stopped 03/20/19 1938)  metroNIDAZOLE (FLAGYL) IVPB 500 mg (0 mg Intravenous Stopped 03/20/19 1959)  vancomycin (VANCOCIN) 2,000 mg in sodium chloride 0.9 % 500 mL IVPB (2,000 mg Intravenous New Bag/Given 03/20/19 2015)  sodium chloride 0.9 % bolus 500 mL (0 mLs Intravenous Stopped 03/20/19 2007)     ED Discharge Orders    None       Note:  This document was prepared using Dragon voice recognition software and may include unintentional dictation errors.   Duffy Bruce, MD 03/20/19 [phone]

## 2019-03-20 NOTE — ED Notes (Signed)
This Rn and Gregor Hams cleaned pt of stool

## 2019-03-20 NOTE — ED Notes (Signed)
CT at bedside 

## 2019-03-20 NOTE — ED Notes (Signed)
Pt BP dropped to 44/33- bolus started

## 2019-03-20 NOTE — Consult Note (Signed)
CODE SEPSIS - PHARMACY COMMUNICATION  **Broad Spectrum Antibiotics should be administered within 1 hour of Sepsis diagnosis**  Time Code Sepsis Called/Page Received: 1727  Antibiotics Ordered: cefepime + vancomycin  Time of 1st antibiotic administration: 1834  Additional action taken by pharmacy: called ED RN to ask if we could assist and to remind her of the one hour sepsis window goal  If necessary, Name of Provider/Nurse Contacted: Alejandro Mulling ,PharmD Clinical Pharmacist  03/20/2019  6:35 PM

## 2019-03-20 NOTE — H&P (Signed)
[facility] [place] at [facility]   PATIENT NAME: Jerome [name]    MR#:  [id number]  DATE OF BIRTH:  05-29-1935  DATE OF ADMISSION:  03/20/2019  PRIMARY CARE PHYSICIAN: Kandyce Rud, MD   REQUESTING/REFERRING PHYSICIAN: Erma Heritage, MD  CHIEF COMPLAINT:   Chief Complaint  Patient presents with  . Loss of Consciousness    HISTORY OF PRESENT ILLNESS:  Jerome [name]  is a 84 y.o. male who presents with chief complaint as above.  Patient presents to the ED with a complaint of syncopal event.  He also states that he has had abdominal discomfort and significant diarrhea for the past couple of days.  On evaluation here he is found to meet sepsis criteria.  His blood pressure was initially very low, though improved very quickly with aggressive IV fluid administration.  CT imaging shows likely enterocolitis.  He is C. difficile negative and coronavirus negative.  GI panel still pending.  Hospitalist were called for admission and further treatment  PAST MEDICAL HISTORY:   Past Medical History:  Diagnosis Date  . CAD (coronary artery disease)   . Chronic systolic CHF (congestive heart failure) (HCC)   . Hypertension      PAST SURGICAL HISTORY:   Past Surgical History:  Procedure Laterality Date  . APPENDECTOMY    . HERNIA REPAIR       SOCIAL HISTORY:   Social History   Tobacco Use  . Smoking status: Never Smoker  . Smokeless tobacco: Never Used  Substance Use Topics  . Alcohol use: Never    Frequency: Never     FAMILY HISTORY:    Family history reviewed and is non-contributory DRUG ALLERGIES:  No Known Allergies  MEDICATIONS AT HOME:   Prior to Admission medications   Medication Sig Start Date End Date Taking? Authorizing Provider  carvedilol (COREG) 6.25 MG tablet Take 1 tablet (6.25 mg total) by mouth 2 (two) times daily with a meal. 12/12/18  Yes Enedina Finner, MD  furosemide (LASIX) 40 MG tablet Take 1 tablet (40 mg total) by mouth  daily. 12/13/18  Yes Enedina Finner, MD  gabapentin (NEURONTIN) 100 MG capsule Take 100 mg by mouth 3 (three) times daily. 03/11/19  Yes [provider]  lisinopril (ZESTRIL) 10 MG tablet Take 1 tablet (10 mg total) by mouth 2 (two) times daily. Patient taking differently: Take 10 mg by mouth daily.  12/12/18  Yes Enedina Finner, MD  Multiple Vitamin (MULTIVITAMIN WITH MINERALS) TABS tablet Take 1 tablet by mouth daily. 12/13/18  Yes Enedina Finner, MD  potassium chloride SA (K-DUR) 20 MEQ tablet Take 1 tablet (20 mEq total) by mouth daily. 12/13/18  Yes Enedina Finner, MD  tamsulosin (FLOMAX) 0.4 MG CAPS capsule Take 1 capsule by mouth daily. 30 minutes after last meal every evening 09/16/18 09/16/19 Yes [provider]  traMADol (ULTRAM) 50 MG tablet Take 1 tablet (50 mg total) by mouth every 6 (six) hours as needed for moderate pain. Patient taking differently: Take 50 mg by mouth 2 (two) times daily.  12/12/18  Yes Enedina Finner, MD    REVIEW OF SYSTEMS:  Review of Systems  Constitutional: Negative for chills, fever, malaise/fatigue and weight loss.  HENT: Negative for ear pain, hearing loss and tinnitus.   Eyes: Negative for blurred vision, double vision, pain and redness.  Respiratory: Negative for cough, hemoptysis and shortness of breath.   Cardiovascular: Negative for chest pain, palpitations, orthopnea and leg swelling.  Gastrointestinal: Positive for abdominal pain  and diarrhea. Negative for constipation, nausea and vomiting.  Genitourinary: Negative for dysuria, frequency and hematuria.  Musculoskeletal: Negative for back pain, joint pain and neck pain.  Skin:       No acne, rash, or lesions  Neurological: Positive for loss of consciousness. Negative for dizziness, tremors, focal weakness and weakness.  Endo/Heme/Allergies: Negative for polydipsia. Does not bruise/bleed easily.  Psychiatric/Behavioral: Negative for depression. The patient is not nervous/anxious and does not have  insomnia.      VITAL SIGNS:   Vitals:   03/20/19 2115 03/20/19 2145 03/20/19 2245 03/20/19 2300  BP: 139/69 (!) 130/55 (!) 132/58 (!) 132/57  Pulse: (!) 105 63 61 61  Resp: 18 (!) 21 15 15   SpO2: 100% 98% 95% 98%  Weight:      Height:       Wt Readings from Last 3 Encounters:  03/20/19 104.3 kg  12/12/18 115.6 kg    PHYSICAL EXAMINATION:  Physical Exam  Vitals reviewed. Constitutional: He is oriented to person, place, and time. He appears well-developed and well-nourished. No distress.  HENT:  Head: Normocephalic and atraumatic.  Dry mucous membranes  Eyes: Pupils are equal, round, and reactive to light. Conjunctivae and EOM are normal. No scleral icterus.  Neck: Normal range of motion. Neck supple. No JVD present. No thyromegaly present.  Cardiovascular: Normal rate, regular rhythm and intact distal pulses. Exam reveals no gallop and no friction rub.  No murmur heard. Respiratory: Effort normal and breath sounds normal. No respiratory distress. He has no wheezes. He has no rales.  GI: Soft. Bowel sounds are normal. He exhibits no distension. There is abdominal tenderness.  Musculoskeletal: Normal range of motion.        General: No edema.     Comments: No arthritis, no gout  Lymphadenopathy:    He has no cervical adenopathy.  Neurological: He is alert and oriented to person, place, and time. No cranial nerve deficit.  No dysarthria, no aphasia  Skin: Skin is warm and dry. No rash noted. No erythema.  Psychiatric: He has a normal mood and affect. His behavior is normal. Judgment and thought content normal.    LABORATORY PANEL:   CBC Recent Labs  Lab 03/20/19 1710  WBC 8.7  HGB 11.2*  HCT 34.6*  PLT 277   ------------------------------------------------------------------------------------------------------------------  Chemistries  Recent Labs  Lab 03/20/19 1710  NA 137  K 5.1  CL 102  CO2 22  GLUCOSE 137*  BUN 44*  CREATININE 2.53*  CALCIUM 9.1  AST  37  ALT 26  ALKPHOS 54  BILITOT 0.9   ------------------------------------------------------------------------------------------------------------------  Cardiac Enzymes No results for input(s): TROPONINI in the last 168 hours. ------------------------------------------------------------------------------------------------------------------  RADIOLOGY:  Ct Abdomen Pelvis Wo Contrast  Result Date: 03/20/2019 CLINICAL DATA:  Abdominal distension, nausea, vomiting EXAM: CT ABDOMEN AND PELVIS WITHOUT CONTRAST TECHNIQUE: Multidetector CT imaging of the abdomen and pelvis was performed following the standard protocol without IV contrast. COMPARISON:  Same-day radiograph of the chest FINDINGS: Lower chest: Few tree-in-bud opacities are present in the periphery of the left lung base. Atherosclerotic calcification of the coronary arteries. Normal heart size. No pericardial effusion. Hepatobiliary: Punctate calcification in the right lobe liver. No concerning hepatic lesions. Gallbladder largely decompressed. No frank wall thickening or visible calcified gallstones. No biliary ductal dilatation. Pancreas: Unremarkable. No pancreatic ductal dilatation or surrounding inflammatory changes. Spleen: Normal in size without focal abnormality. Adrenals/Urinary Tract: Normal adrenal glands. Few nonobstructing calcifications are present in the lower poles of both kidney.  No obstructive urolithiasis or hydronephrosis. Several partially exophytic fluid attenuation cysts are seen in both kidneys, largest on the right measures approximately 4 cm, largest on the left measures approximately 3.7 cm no concerning renal lesions. Mild mural thickening of the bladder is slightly greater than expected for the degree of distention. Stomach/Bowel: Distal esophagus stomach and duodenum are unremarkable. Multiple loops of fluid-filled small bowel. Much of the colon is fluid-filled as well with some pericolonic hazy stranding most  pronounced in the distal sigmoid. Of the segment does appear to be under active peristalsis. The appendix is surgically absent. Vascular/Lymphatic: Atherosclerotic plaque within the normal caliber aorta. No suspicious or enlarged lymph nodes in the included lymphatic chains. Reproductive: Coarse eccentric calcification of the borderline enlarged prostate. No concerning abnormalities of the prostate or seminal vesicles. Other: No abdominopelvic free fluid or free gas. No bowel containing hernias. Bilateral fat containing inguinal hernias are noted. Musculoskeletal: Multilevel degenerative changes are present in the imaged portions of the spine. Features are most severe at the L5 level grade 1 anterolisthesis of L4 on L5 noted as well. Mild wedging of several contiguous lower thoracic vertebrae appears to be on a chronic, likely degenerative basis. IMPRESSION: 1. Fluid-filled small and large bowel with some hazy pericolonic stranding. Correlate with clinical symptoms of enterocolitis 2. Mild mural thickening of the bladder is slightly greater than expected for the degree of distention. Could reflect a mild cystitis or chronic bladder outlet obstruction due to a borderline enlarged prostate. 3. Few tree-in-bud opacities in the periphery of the left lung base, suggestive of infectious or inflammatory bronchiolitis. 4. Aortic Atherosclerosis (ICD10-I70.0). Electronically Signed   By: Kreg ShropshirePrice  DeHay M.D.   On: 03/20/2019 19:20   Ct Head Wo Contrast  Result Date: 03/20/2019 CLINICAL DATA:  Altered level of consciousness, unexplained EXAM: CT HEAD WITHOUT CONTRAST TECHNIQUE: Contiguous axial images were obtained from the base of the skull through the vertex without intravenous contrast. COMPARISON:  None. 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 Multiple benign-appearing dural calcifications. No evidence of acute infarction, hemorrhage, hydrocephalus, extra-axial collection or mass lesion/mass effect.  Symmetric prominence of the ventricles, cisterns and sulci compatible with parenchymal volume loss. Patchy areas of white matter hypoattenuation are most compatible with chronic microvascular angiopathy. Vascular: Atherosclerotic calcification of the carotid siphons and intradural vertebral arteries. No hyperdense vessel. Skull: No calvarial fracture or suspicious osseous lesion. No scalp swelling or hematoma. Sinuses/Orbits: Paranasal sinuses and mastoid air cells are predominantly clear. Included orbital structures are unremarkable. Other: Edentulous with mandibular prognathism best appreciated on scout view IMPRESSION: No acute intracranial abnormality. Electronically Signed   By: Kreg ShropshirePrice  DeHay M.D.   On: 03/20/2019 19:22   Dg Chest Port 1 View  Result Date: 03/20/2019 CLINICAL DATA:  PT from home via EMS with c/o vasal vagal response while on toilet today with + LOC. Pt was unresponsive upon arrival per EMS with BP in the 60s. Pt was given fluids and became more verbal. PT is alert and responding to pain. PT arrives in emesis and stool. BP 67/47 EXAM: PORTABLE CHEST 1 VIEW COMPARISON:  12/08/2018 FINDINGS: Cardiac silhouette is normal in size. No mediastinal or hilar masses. No evidence of adenopathy. Lungs are clear.  No pleural effusion or pneumothorax. Skeletal structures are grossly intact. IMPRESSION: No active disease. Electronically Signed   By: Amie Portlandavid  Ormond M.D.   On: 03/20/2019 18:58    EKG:   Orders placed or performed during the hospital encounter of 03/20/19  . EKG  12-Lead  . EKG 12-Lead  . ED EKG 12-Lead  . ED EKG 12-Lead    IMPRESSION AND PLAN:  Principal Problem:   Severe sepsis (HCC) -due to colitis.  It is unclear at this time this is bacterial or viral in nature C. difficile negative, coronavirus negative.  GI panel is still pending.  Lactic acid was initially elevated and came down some with IV fluid administration.  We will continue IV fluids and recheck lactic acid until  within normal limits.  Procalcitonin ordered.  Cultures sent Active Problems:   Enterocolitis -supportive treatment, IV fluids, see above   Acute on chronic renal failure (HCC) -likely related to his dehydration, aggressive IV fluids as above, avoid nephrotoxins and monitor for improvement   Chronic systolic CHF (congestive heart failure) (HCC) -monitor closely with IV fluid administration as above, continue home meds   CAD (coronary artery disease) -continue home medications   HTN (hypertension) -hold antihypertensives for now as the patient was initially hypotensive and requiring fluid resuscitation    Chart review performed and case discussed with ED provider. Labs, imaging and/or ECG reviewed by provider and discussed with patient/family. Management plans discussed with the patient and/or family.  COVID-19 status: Tested negative     DVT PROPHYLAXIS: SubQ heparin  GI PROPHYLAXIS:  None  ADMISSION STATUS: Inpatient     CODE STATUS: Full Code Status History    Date Active Date Inactive Code Status Order ID Comments User Context   12/09/2018 0153 12/12/2018 2051 Full Code [id number]  Pearletha Alfred, NP ED   Advance Care Planning Activity      TOTAL TIME TAKING CARE OF THIS PATIENT: 45 minutes.   This patient was evaluated in the context of the global COVID-19 pandemic, which necessitated consideration that the patient might be at risk for infection with the SARS-CoV-2 virus that causes COVID-19. Institutional protocols and algorithms that pertain to the evaluation of patients at risk for COVID-19 are in a state of rapid change based on information released by regulatory bodies including the CDC and federal and state organizations. These policies and algorithms were followed to the best of this provider's knowledge to date during the patient's care at this facility.  Barney Drain 03/20/2019, 11:22 PM  [facility]  Office  [phone]  CC: Primary care physician;  Kandyce Rud, MD  Note:  This document was prepared using Dragon voice recognition software and may include unintentional dictation errors.

## 2019-03-20 NOTE — Consult Note (Signed)
PHARMACY -  BRIEF ANTIBIOTIC NOTE   Pharmacy has received consult(s) for vancomycin and cefepime from an ED provider.  The patient's profile has been reviewed for ht/wt/allergies/indication/available labs.    One time order(s) placed for:  Vancomycin 2000 mg IV x 1  Cefepime 2 grams IV x 1  Further antibiotics/pharmacy consults should be ordered by admitting physician if indicated.                       Thank you,  Dallie Piles, PharmD 03/20/2019  5:25 PM

## 2019-03-20 NOTE — ED Notes (Signed)
Second bolus started

## 2019-03-20 NOTE — Sepsis Progress Note (Signed)
Notified bedside nurse of need to draw blood cultures and administer antibiotics after the cultures have been drawn.Marland Kitchen

## 2019-03-21 LAB — URINALYSIS, ROUTINE W REFLEX MICROSCOPIC
Bacteria, UA: NONE SEEN
Bilirubin Urine: NEGATIVE
Glucose, UA: NEGATIVE mg/dL
Hgb urine dipstick: NEGATIVE
Ketones, ur: 5 mg/dL — AB
Nitrite: NEGATIVE
Protein, ur: NEGATIVE mg/dL
Specific Gravity, Urine: 1.017 (ref 1.005–1.030)
WBC, UA: >50 WBC/hpf — ABNORMAL HIGH (ref 0–5)
pH: 5 (ref 5.0–8.0)

## 2019-03-21 LAB — CBC
HCT: 34.1 % — ABNORMAL LOW (ref 39.0–52.0)
Hemoglobin: 11 g/dL — ABNORMAL LOW (ref 13.0–17.0)
MCH: 32.2 pg (ref 26.0–34.0)
MCHC: 32.3 g/dL (ref 30.0–36.0)
MCV: 99.7 fL (ref 80.0–100.0)
Platelets: 218 10*3/uL (ref 150–400)
RBC: 3.42 MIL/uL — ABNORMAL LOW (ref 4.22–5.81)
RDW: 15.1 % (ref 11.5–15.5)
WBC: 14.2 10*3/uL — ABNORMAL HIGH (ref 4.0–10.5)
nRBC: 0 % (ref 0.0–0.2)

## 2019-03-21 LAB — PROCALCITONIN: Procalcitonin: 0.23 ng/mL

## 2019-03-21 LAB — BASIC METABOLIC PANEL
Anion gap: 8 (ref 5–15)
BUN: 43 mg/dL — ABNORMAL HIGH (ref 8–23)
CO2: 23 mmol/L (ref 22–32)
Calcium: 8.3 mg/dL — ABNORMAL LOW (ref 8.9–10.3)
Chloride: 111 mmol/L (ref 98–111)
Creatinine, Ser: 2.1 mg/dL — ABNORMAL HIGH (ref 0.61–1.24)
GFR calc Af Amer: 33 mL/min — ABNORMAL LOW (ref >60)
GFR calc non Af Amer: 28 mL/min — ABNORMAL LOW (ref >60)
Glucose, Bld: 137 mg/dL — ABNORMAL HIGH (ref 70–99)
Potassium: 4.4 mmol/L (ref 3.5–5.1)
Sodium: 142 mmol/L (ref 135–145)

## 2019-03-21 LAB — LACTIC ACID, PLASMA: Lactic Acid, Venous: 2.7 mmol/L (ref 0.5–1.9)

## 2019-03-21 MED ORDER — TRAMADOL HCL 50 MG PO TABS
50.0000 mg | ORAL_TABLET | Freq: Two times a day (BID) | ORAL | Status: DC
  Administered 2019-03-21 – 2019-03-22 (×4): 50 mg via ORAL
  Filled 2019-03-21 (×4): qty 1

## 2019-03-21 MED ORDER — HEPARIN SODIUM (PORCINE) 5000 UNIT/ML IJ SOLN
5000.0000 [IU] | Freq: Three times a day (TID) | INTRAMUSCULAR | Status: DC
  Administered 2019-03-21 – 2019-03-22 (×4): 5000 [IU] via SUBCUTANEOUS
  Filled 2019-03-21 (×4): qty 1

## 2019-03-21 MED ORDER — SODIUM CHLORIDE 0.9 % IV SOLN
INTRAVENOUS | Status: DC
  Administered 2019-03-22 – 2019-03-23 (×3): via INTRAVENOUS

## 2019-03-21 MED ORDER — CARVEDILOL 3.125 MG PO TABS
6.2500 mg | ORAL_TABLET | Freq: Two times a day (BID) | ORAL | Status: DC
  Administered 2019-03-21 – 2019-03-23 (×5): 6.25 mg via ORAL
  Filled 2019-03-21 (×5): qty 2

## 2019-03-21 MED ORDER — ACETAMINOPHEN 650 MG RE SUPP: 650.0000 mg | Freq: Four times a day (QID) | RECTAL | Status: DC | PRN

## 2019-03-21 MED ORDER — ACETAMINOPHEN 325 MG PO TABS: 650.0000 mg | ORAL_TABLET | Freq: Four times a day (QID) | ORAL | Status: DC | PRN

## 2019-03-21 MED ORDER — ONDANSETRON HCL 4 MG/2ML IJ SOLN
4.0000 mg | Freq: Four times a day (QID) | INTRAMUSCULAR | Status: DC | PRN
  Administered 2019-03-21 – 2019-03-23 (×3): 4 mg via INTRAVENOUS
  Filled 2019-03-21 (×3): qty 2

## 2019-03-21 MED ORDER — SODIUM CHLORIDE 0.9 % IV SOLN
INTRAVENOUS | Status: DC
  Administered 2019-03-21: 04:00:00 via INTRAVENOUS

## 2019-03-21 MED ORDER — SODIUM CHLORIDE 0.9 % IV SOLN
2.0000 g | Freq: Two times a day (BID) | INTRAVENOUS | Status: DC
  Administered 2019-03-21 – 2019-03-23 (×6): 2 g via INTRAVENOUS
  Filled 2019-03-21 (×9): qty 2

## 2019-03-21 MED ORDER — INFLUENZA VAC A&B SA ADJ QUAD 0.5 ML IM PRSY
0.5000 mL | PREFILLED_SYRINGE | INTRAMUSCULAR | Status: AC
  Administered 2019-03-22: 11:00:00 0.5 mL via INTRAMUSCULAR
  Filled 2019-03-21: qty 0.5

## 2019-03-21 MED ORDER — TAMSULOSIN HCL 0.4 MG PO CAPS
0.4000 mg | ORAL_CAPSULE | Freq: Every day | ORAL | Status: DC
  Administered 2019-03-21 – 2019-03-24 (×4): 0.4 mg via ORAL
  Filled 2019-03-21 (×4): qty 1

## 2019-03-21 MED ORDER — GABAPENTIN 100 MG PO CAPS
100.0000 mg | ORAL_CAPSULE | Freq: Three times a day (TID) | ORAL | Status: DC
  Administered 2019-03-21 – 2019-03-25 (×11): 100 mg via ORAL
  Filled 2019-03-21 (×14): qty 1

## 2019-03-21 MED ORDER — SODIUM CHLORIDE 0.9 % IV SOLN
2.0000 g | INTRAVENOUS | Status: DC
  Filled 2019-03-21: qty 2

## 2019-03-21 MED ORDER — LISINOPRIL 10 MG PO TABS
10.0000 mg | ORAL_TABLET | Freq: Every day | ORAL | Status: DC
  Administered 2019-03-21 – 2019-03-24 (×4): 10 mg via ORAL
  Filled 2019-03-21 (×4): qty 1

## 2019-03-21 MED ORDER — VANCOMYCIN VARIABLE DOSE PER UNSTABLE RENAL FUNCTION (PHARMACIST DOSING): Status: DC

## 2019-03-21 MED ORDER — FUROSEMIDE 40 MG PO TABS
40.0000 mg | ORAL_TABLET | Freq: Every day | ORAL | Status: DC
  Administered 2019-03-21 – 2019-03-22 (×2): 40 mg via ORAL
  Filled 2019-03-21 (×2): qty 1

## 2019-03-21 MED ORDER — ONDANSETRON HCL 4 MG PO TABS: 4.0000 mg | ORAL_TABLET | Freq: Four times a day (QID) | ORAL | Status: DC | PRN

## 2019-03-21 NOTE — Progress Notes (Signed)
   03/21/19 1200  Clinical Encounter Type  Visited With Patient  Visit Type Initial  Referral From Nurse  Spiritual Encounters  Spiritual Needs Emotional;Grief support  Stress Factors  Patient Stress Factors Health changes  Ch received an OR to create or update AD. Pt wants his son to be his 58. Pt shared about how his life changed from being an active person to being a patient after his knee deteriorated and the challenges and griefs that come with such a change. 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 He grieves that he cannot continue to do that with his current health conditions but showed hope for recovery. Pt also talked about his children and the stories related to his ex-wife. Pt is a [place] who believes that God takes care of him and that he needs to do what he can do in this moment. Ch provided a listening ear and active reflecting and also gave the pt AD education. Pt has enteric precautions so he will try to complete the document once he gets discharged.

## 2019-03-21 NOTE — Progress Notes (Signed)
Pharmacy Antibiotic Note  Jerome [name] Jerome [name]. is a 84 y.o. male admitted on 03/20/2019 with sepsis.  Pharmacy has been consulted for Vancomycin and Cefepime dosing.  Plan: Cefepime 2gm IV q24hrs Will dose Vancomycin based on levels given pt's unstable renal fxn / elevated SCr.    Height: 5\' 9"  (175.3 cm) Weight: 230 lb (104.3 kg) IBW/kg (Calculated) : 70.7  No data recorded.  Recent Labs  Lab 03/20/19 1710 03/20/19 1920  WBC 8.7  --   CREATININE 2.53*  --   LATICACIDVEN 4.2* 2.6*    Estimated Creatinine Clearance: 26.8 mL/min (A) (by C-G formula based on SCr of 2.53 mg/dL (H)).    No Known Allergies  Antimicrobials this admission: Cefepime 10/1 >>  Vancomycin 10/1 >>  Flagyl 10/1 x 1 dose  Dose adjustments this admission:   Microbiology results:  BCx:   UCx:    Sputum:    MRSA PCR:   Thank you for allowing pharmacy to be a part of this patient's care.  Hart Robinsons A 03/21/2019 2:55 AM

## 2019-03-21 NOTE — Clinical Social Work Note (Addendum)
CSW acknowledges consult for home health needs. Patient is active with hospice services through [facility]. CSW will continue to follow and keep them updated.  Dayton Scrape, [place] [phone]  3:32 pm Patient has signed form to revoke hospice services while in the hospital. CSW sent back to [place].  Dayton Scrape, [place]

## 2019-03-21 NOTE — Progress Notes (Addendum)
Pharmacy Antibiotic Note  Jerome Lindon [place]. is a 84 y.o. male admitted on 03/20/2019 with sepsis which is likely due to suspected colitis.  Pharmacy has been consulted for Vancomycin and Cefepime dosing.  Plan: 1.Cefepime 2gm IV q12hrs. Will follow up on renal function with AM labs/   2.Will dose Vancomycin based on levels given pt's unstable renal fxn / elevated SCr.  Will obtain a 36-hr Vancomycin Random level   Height: 5\' 9"  (175.3 cm) Weight: 230 lb (104.3 kg) IBW/kg (Calculated) : 70.7  Temp (24hrs), Avg:98.4 F (36.9 C), Min:98.4 F (36.9 C), Max:98.4 F (36.9 C)  Recent Labs  Lab 03/20/19 1710 03/20/19 1920 03/21/19 0459  WBC 8.7  --  14.2*  CREATININE 2.53*  --  2.10*  LATICACIDVEN 4.2* 2.6* 2.7*    Estimated Creatinine Clearance: 32.3 mL/min (A) (by C-G formula based on SCr of 2.1 mg/dL (H)).    No Known Allergies  Antimicrobials this admission: Cefepime 10/1 >>  Vancomycin 10/1 >>  Flagyl 10/1 x 1 dose  Thank you for allowing pharmacy to be a part of this patient's care.  Rowland Lathe 03/21/2019 9:27 AM

## 2019-03-21 NOTE — ED Notes (Addendum)
ED TO INPATIENT HANDOFF REPORT  ED Nurse Name and Phone #:   Elijah Birk RN  #[phone]  S Name/Age/Gender Jerome [name]. 84 y.o. male Room/Bed: ED06A/ED06A  Code Status   Code Status: Prior  Home/SNF/Other Home Patient oriented to: self, place, time and situation Is this baseline? Yes   Triage Complete: Triage complete  Chief Complaint syncope  Triage Note PT from home via EMS with c/o vasal vagal response while on toilet today with + LOC. Pt was unresponsive upon arrival per EMS with BP in the 60s. Pt was given fluids and became more verbal. PT is alert and responding to pain. PT arrives in emesis and stool. BP 67/47   Allergies No Known Allergies  Level of Care/Admitting Diagnosis ED Disposition    ED Disposition Condition Comment   Admit  Hospital Area: [facility] [[id number]]  Level of Care: Med-Surg [16]  Covid Evaluation: Confirmed COVID Negative  Diagnosis: Severe sepsis [facility]) [[id number]]  Admitting Physician: Oralia Manis [[id number]]  Attending Physician: Oralia Manis [phone]  Estimated length of stay: past midnight tomorrow  Certification:: I certify this patient will need inpatient services for at least 2 midnights  PT Class (Do Not Modify): Inpatient [101]  PT Acc Code (Do Not Modify): Private [1]       B Medical/Surgery History Past Medical History:  Diagnosis Date  . CAD (coronary artery disease)   . Chronic systolic CHF (congestive heart failure) (HCC)   . Hypertension    Past Surgical History:  Procedure Laterality Date  . APPENDECTOMY    . HERNIA REPAIR       A IV Location/Drains/Wounds Patient Lines/Drains/Airways Status   Active Line/Drains/Airways    Name:   Placement date:   Placement time:   Site:   Days:   Peripheral IV 03/20/19 Right Antecubital   03/20/19    1705    Antecubital   1   Peripheral IV 03/20/19 Left Forearm   03/20/19    1701    Forearm   1          Intake/Output Last 24  hours  Intake/Output Summary (Last 24 hours) at 03/21/2019 0051 Last data filed at 03/20/2019 2215 Gross per 24 hour  Intake 3199.79 ml  Output -  Net 3199.79 ml    Labs/Imaging Results for orders placed or performed during the hospital encounter of 03/20/19 (from the past 48 hour(s))  Lactic acid, plasma     Status: Abnormal   Collection Time: 03/20/19  5:10 PM  Result Value Ref Range   Lactic Acid, Venous 4.2 (HH) 0.5 - 1.9 mmol/L    Comment: CRITICAL RESULT CALLED TO, READ BACK BY AND VERIFIED WITH DR Jerome [name] 03/21/19 1744 KLW Performed at [facility], [address] [place], [place] [address]   Comprehensive metabolic panel     Status: Abnormal   Collection Time: 03/20/19  5:10 PM  Result Value Ref Range   Sodium 137 135 - 145 mmol/L   Potassium 5.1 3.5 - 5.1 mmol/L   Chloride 102 98 - 111 mmol/L   CO2 22 22 - 32 mmol/L   Glucose, Bld 137 (H) 70 - 99 mg/dL   BUN 44 (H) 8 - 23 mg/dL   Creatinine, Ser 8.67 (H) 0.61 - 1.24 mg/dL   Calcium 9.1 8.9 - 61.9 mg/dL   Total Protein 6.9 6.5 - 8.1 g/dL   Albumin 3.0 (L) 3.5 - 5.0 g/dL   AST 37 15 - 41 U/L  ALT 26 0 - 44 U/L   Alkaline Phosphatase 54 38 - 126 U/L   Total Bilirubin 0.9 0.3 - 1.2 mg/dL   GFR calc non Af Amer 23 (L) >60 mL/min   GFR calc Af Amer 26 (L) >60 mL/min   Anion gap 13 5 - 15    Comment: Performed at [facility], [address] [name], [place] [address]  CBC WITH DIFFERENTIAL     Status: Abnormal   Collection Time: 03/20/19  5:10 PM  Result Value Ref Range   WBC 8.7 4.0 - 10.5 K/uL   RBC 3.50 (L) 4.22 - 5.81 MIL/uL   Hemoglobin 11.2 (L) 13.0 - 17.0 g/dL   HCT 34.6 (L) 39.0 - 52.0 %   MCV 98.9 80.0 - 100.0 fL   MCH 32.0 26.0 - 34.0 pg   MCHC 32.4 30.0 - 36.0 g/dL   RDW 14.8 11.5 - 15.5 %   Platelets 277 150 - 400 K/uL   nRBC 0.0 0.0 - 0.2 %   Neutrophils Relative % 76 %   Neutro Abs 6.6 1.7 - 7.7 K/uL   Lymphocytes Relative 14 %   Lymphs Abs 1.2 0.7 - 4.0 K/uL    Monocytes Relative 7 %   Monocytes Absolute 0.6 0.1 - 1.0 K/uL   Eosinophils Relative 1 %   Eosinophils Absolute 0.1 0.0 - 0.5 K/uL   Basophils Relative 1 %   Basophils Absolute 0.1 0.0 - 0.1 K/uL   Immature Granulocytes 1 %   Abs Immature Granulocytes 0.05 0.00 - 0.07 K/uL    Comment: Performed at [facility], [address] [place], [place] [address]  APTT     Status: None   Collection Time: 03/20/19  5:10 PM  Result Value Ref Range   aPTT 27 24 - 36 seconds    Comment: Performed at [facility], [address] [place], [place] [address]  Protime-INR     Status: None   Collection Time: 03/20/19  5:10 PM  Result Value Ref Range   Prothrombin Time 14.9 11.4 - 15.2 seconds   INR 1.2 0.8 - 1.2    Comment: (NOTE) INR goal varies based on device and disease states. Performed at [facility], [address] [place], [place] [address]   Lactic acid, plasma     Status: Abnormal   Collection Time: 03/20/19  7:20 PM  Result Value Ref Range   Lactic Acid, Venous 2.6 (HH) 0.5 - 1.9 mmol/L    Comment: CRITICAL VALUE NOTED. VALUE IS CONSISTENT WITH PREVIOUSLY REPORTED/CALLED VALUE SNG Performed at [facility], [address] [place], [place] [address]   C difficile quick scan w PCR reflex     Status: None   Collection Time: 03/20/19  8:03 PM   Specimen: STOOL  Result Value Ref Range   C Diff antigen NEGATIVE NEGATIVE   C Diff toxin NEGATIVE NEGATIVE   C Diff interpretation No C. difficile detected.     Comment: Performed at [facility], [address] [place], [place] [address]  SARS Coronavirus 2 [facility] order, Performed in [facility] hospital lab) Nasopharyngeal Nasopharyngeal Swab     Status: None   Collection Time: 03/20/19  9:09 PM   Specimen: Nasopharyngeal Swab  Result Value Ref Range   SARS Coronavirus 2 NEGATIVE NEGATIVE    Comment: (NOTE) If result is NEGATIVE SARS-CoV-2 target nucleic acids are NOT DETECTED. The  SARS-CoV-2 RNA is generally detectable in upper and lower  respiratory specimens during the acute phase of infection. The lowest  concentration of SARS-CoV-2 viral copies this assay can detect is 250  copies / mL. A negative result does not preclude SARS-CoV-2 infection  and should not be used as the sole basis for treatment or other  patient management decisions.  A negative result may occur with  improper specimen collection / handling, submission of specimen other  than nasopharyngeal swab, presence of viral mutation(s) within the  areas targeted by this assay, and inadequate number of viral copies  (<250 copies / mL). A negative result must be combined with clinical  observations, patient history, and epidemiological information. If result is POSITIVE SARS-CoV-2 target nucleic acids are DETECTED. The SARS-CoV-2 RNA is generally detectable in upper and lower  respiratory specimens dur ing the acute phase of infection.  Positive  results are indicative of active infection with SARS-CoV-2.  Clinical  correlation with patient history and other diagnostic information is  necessary to determine patient infection status.  Positive results do  not rule out bacterial infection or co-infection with other viruses. If result is PRESUMPTIVE POSTIVE SARS-CoV-2 nucleic acids MAY BE PRESENT.   A presumptive positive result was obtained on the submitted specimen  and confirmed on repeat testing.  While 2019 novel coronavirus  (SARS-CoV-2) nucleic acids may be present in the submitted sample  additional confirmatory testing may be necessary for epidemiological  and / or clinical management purposes  to differentiate between  SARS-CoV-2 and other Sarbecovirus currently known to infect humans.  If clinically indicated additional testing with an alternate test  methodology [phone]) is advised. The SARS-CoV-2 RNA is generally  detectable in upper and lower respiratory sp ecimens during the acute   phase of infection. The expected result is Negative. Fact Sheet for Patients:  BoilerBrush.com.cy Fact Sheet for Healthcare Providers: https://pope.com/ This test is not yet approved or cleared by the [place] FDA and has been authorized for detection and/or diagnosis of SARS-CoV-2 by FDA under an Emergency Use Authorization (EUA).  This EUA will remain in effect (meaning this test can be used) for the duration of the COVID-19 declaration under Section 564(b)(1) of the Act, 21 U.S.C. section 360bbb-3(b)(1), unless the authorization is terminated or revoked sooner. Performed at [facility], [address] [place], [place] [address]    Ct Abdomen Pelvis Wo Contrast  Result Date: 03/20/2019 CLINICAL DATA:  Abdominal distension, nausea, vomiting EXAM: CT ABDOMEN AND PELVIS WITHOUT CONTRAST TECHNIQUE: Multidetector CT imaging of the abdomen and pelvis was performed following the standard protocol without IV contrast. COMPARISON:  Same-day radiograph of the chest FINDINGS: Lower chest: Few tree-in-bud opacities are present in the periphery of the left lung base. Atherosclerotic calcification of the coronary arteries. Normal heart size. No pericardial effusion. Hepatobiliary: Punctate calcification in the right lobe liver. No concerning hepatic lesions. Gallbladder largely decompressed. No frank wall thickening or visible calcified gallstones. No biliary ductal dilatation. Pancreas: Unremarkable. No pancreatic ductal dilatation or surrounding inflammatory changes. Spleen: Normal in size without focal abnormality. Adrenals/Urinary Tract: Normal adrenal glands. Few nonobstructing calcifications are present in the lower poles of both kidney. No obstructive urolithiasis or hydronephrosis. Several partially exophytic fluid attenuation cysts are seen in both kidneys, largest on the right measures approximately 4 cm, largest on the left measures  approximately 3.7 cm no concerning renal lesions. Mild mural thickening of the bladder is slightly greater than expected for the degree of distention. Stomach/Bowel: Distal esophagus stomach and duodenum are unremarkable. Multiple loops of  fluid-filled small bowel. Much of the colon is fluid-filled as well with some pericolonic hazy stranding most pronounced in the distal sigmoid. Of the segment does appear to be under active peristalsis. The appendix is surgically absent. Vascular/Lymphatic: Atherosclerotic plaque within the normal caliber aorta. No suspicious or enlarged lymph nodes in the included lymphatic chains. Reproductive: Coarse eccentric calcification of the borderline enlarged prostate. No concerning abnormalities of the prostate or seminal vesicles. Other: No abdominopelvic free fluid or free gas. No bowel containing hernias. Bilateral fat containing inguinal hernias are noted. Musculoskeletal: Multilevel degenerative changes are present in the imaged portions of the spine. Features are most severe at the L5 level grade 1 anterolisthesis of L4 on L5 noted as well. Mild wedging of several contiguous lower thoracic vertebrae appears to be on a chronic, likely degenerative basis. IMPRESSION: 1. Fluid-filled small and large bowel with some hazy pericolonic stranding. Correlate with clinical symptoms of enterocolitis 2. Mild mural thickening of the bladder is slightly greater than expected for the degree of distention. Could reflect a mild cystitis or chronic bladder outlet obstruction due to a borderline enlarged prostate. 3. Few tree-in-bud opacities in the periphery of the left lung base, suggestive of infectious or inflammatory bronchiolitis. 4. Aortic Atherosclerosis (ICD10-I70.0). Electronically Signed   By: Kreg Shropshire M.D.   On: 03/20/2019 19:20   Ct Head Wo Contrast  Result Date: 03/20/2019 CLINICAL DATA:  Altered level of consciousness, unexplained EXAM: CT HEAD WITHOUT CONTRAST TECHNIQUE:  Contiguous axial images were obtained from the base of the skull through the vertex without intravenous contrast. COMPARISON:  None. 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 Multiple benign-appearing dural calcifications. No evidence of acute infarction, hemorrhage, hydrocephalus, extra-axial collection or mass lesion/mass effect. Symmetric prominence of the ventricles, cisterns and sulci compatible with parenchymal volume loss. Patchy areas of white matter hypoattenuation are most compatible with chronic microvascular angiopathy. Vascular: Atherosclerotic calcification of the carotid siphons and intradural vertebral arteries. No hyperdense vessel. Skull: No calvarial fracture or suspicious osseous lesion. No scalp swelling or hematoma. Sinuses/Orbits: Paranasal sinuses and mastoid air cells are predominantly clear. Included orbital structures are unremarkable. Other: Edentulous with mandibular prognathism best appreciated on scout view IMPRESSION: No acute intracranial abnormality. Electronically Signed   By: Kreg Shropshire M.D.   On: 03/20/2019 19:22   Dg Chest Port 1 View  Result Date: 03/20/2019 CLINICAL DATA:  PT from home via EMS with c/o vasal vagal response while on toilet today with + LOC. Pt was unresponsive upon arrival per EMS with BP in the 60s. Pt was given fluids and became more verbal. PT is alert and responding to pain. PT arrives in emesis and stool. BP 67/47 EXAM: PORTABLE CHEST 1 VIEW COMPARISON:  12/08/2018 FINDINGS: Cardiac silhouette is normal in size. No mediastinal or hilar masses. No evidence of adenopathy. Lungs are clear.  No pleural effusion or pneumothorax. Skeletal structures are grossly intact. IMPRESSION: No active disease. Electronically Signed   By: Amie Portland M.D.   On: 03/20/2019 18:58    Pending Labs Unresulted Labs (From admission, onward)    Start     Ordered   03/20/19 2026  GI pathogen panel by PCR, stool  (Gastrointestinal Panel by PCR, Stool                                                                                                                                                      *  Does Not include CLOSTRIDIUM DIFFICILE testing.**If CDIFF testing is needed, select the C Difficile Quick Screen w PCR reflex order below)  Once,   STAT     03/20/19 2025   03/20/19 1720  Blood Culture (routine x 2)  BLOOD CULTURE X 2,   STAT     03/20/19 1720   03/20/19 1720  Urinalysis, Routine w reflex microscopic  ONCE - STAT,   STAT     03/20/19 1720   03/20/19 1720  Urine culture  ONCE - STAT,   STAT     03/20/19 1720   Signed and Held  CBC  (heparin)  Once,   R    Comments: Baseline for heparin therapy IF NOT ALREADY DRAWN.  Notify MD if PLT < 100 K.    Signed and Held   Signed and Held  Creatinine, serum  (heparin)  Once,   R    Comments: Baseline for heparin therapy IF NOT ALREADY DRAWN.    Signed and Held   Signed and Held  Basic metabolic panel  Tomorrow morning,   R     Signed and Held   Signed and Held  CBC  Tomorrow morning,   R     Signed and Held          Vitals/Pain Today's Vitals   03/20/19 2300 03/20/19 2330 03/20/19 2345 03/21/19 0045  BP: (!) 132/57 (!) 125/56 (!) 149/66 (!) 125/55  Pulse: 61 (!) 59 65 (!) 59  Resp: 15 16 13  (!) 22  SpO2: 98% 99% 100% 100%  Weight:      Height:      PainSc:        Isolation Precautions No active isolations  Medications Medications  ceFEPIme (MAXIPIME) 2 g in sodium chloride 0.9 % 100 mL IVPB (has no administration in time range)  vancomycin variable dose per unstable renal function (pharmacist dosing) (has no administration in time range)  sodium chloride 0.9 % bolus 1,000 mL (0 mLs Intravenous Stopped 03/20/19 1748)  sodium chloride 0.9 % bolus 1,000 mL (0 mLs Intravenous Stopped 03/20/19 1748)  ceFEPIme (MAXIPIME) 2 g in sodium chloride 0.9 % 100 mL IVPB (0 g Intravenous Stopped 03/20/19 1938)  metroNIDAZOLE (FLAGYL) IVPB 500 mg (0 mg Intravenous Stopped 03/20/19 1959)   vancomycin (VANCOCIN) 2,000 mg in sodium chloride 0.9 % 500 mL IVPB (0 mg Intravenous Stopped 03/20/19 2215)  sodium chloride 0.9 % bolus 500 mL (0 mLs Intravenous Stopped 03/20/19 2007)    Mobility non-ambulatory High fall risk   Focused Assessments Cardiac Assessment Handoff:    Lab Results  Component Value Date   TROPONINI 0.08 (HH) 12/09/2018   No results found for: DDIMER Does the Patient currently have chest pain? No     R Recommendations: See Admitting Provider Note  Report given to:  Brandy RN on 1C  Additional Notes:

## 2019-03-21 NOTE — Progress Notes (Signed)
[facility] - [place] at [facility]   PATIENT NAME: Letta KocherGordon Benak    MR#:  [id number]  DATE OF BIRTH:  1935-04-03  SUBJECTIVE:  CHIEF COMPLAINT:   Chief Complaint  Patient presents with   Loss of Consciousness   No new complaint this morning.  I was later notified by nursing staff that patient had an episode of vomiting.  No diarrhea this morning.  No abdominal pains this morning.  No fevers overnight.  REVIEW OF SYSTEMS:  Review of Systems  Constitutional: Negative for chills and fever.  HENT: Negative for hearing loss and tinnitus.   Eyes: Negative for blurred vision and double vision.  Respiratory: Negative for cough and shortness of breath.   Cardiovascular: Negative for chest pain.  Gastrointestinal:       Nausea and vomiting this morning.  Diarrhea appears improved.  No abdominal pains this morning.  Genitourinary: Negative for dysuria and urgency.  Musculoskeletal: Negative for myalgias and neck pain.  Skin: Negative for itching and rash.  Neurological: Negative for dizziness and headaches.  Psychiatric/Behavioral: Negative for depression and hallucinations.    DRUG ALLERGIES:  No Known Allergies VITALS:  Blood pressure (!) 129/55, pulse 62, temperature 98.5 F (36.9 C), temperature source Oral, resp. rate 18, height 5\' 9"  (1.753 m), weight 104.3 kg, SpO2 97 %. PHYSICAL EXAMINATION:   Physical Exam  Constitutional: He is oriented to person, place, and time. He appears well-developed.  HENT:  Head: Normocephalic and atraumatic.  Right Ear: External ear normal.  Eyes: Pupils are equal, round, and reactive to light. Conjunctivae are normal.  Neck: Normal range of motion. Neck supple. No thyromegaly present.  Cardiovascular: Normal rate, regular rhythm and normal heart sounds.  Respiratory: Effort normal and breath sounds normal. He has no wheezes.  GI: Soft. Bowel sounds are normal. He exhibits no distension.  Musculoskeletal: Normal range of  motion.        General: No edema.  Neurological: He is alert and oriented to person, place, and time. No cranial nerve deficit.  Skin: Skin is warm and dry. He is not diaphoretic. No erythema.   LABORATORY PANEL:  Male CBC Recent Labs  Lab 03/21/19 0459  WBC 14.2*  HGB 11.0*  HCT 34.1*  PLT 218   ------------------------------------------------------------------------------------------------------------------ Chemistries  Recent Labs  Lab 03/20/19 1710 03/21/19 0459  NA 137 142  K 5.1 4.4  CL 102 111  CO2 22 23  GLUCOSE 137* 137*  BUN 44* 43*  CREATININE 2.53* 2.10*  CALCIUM 9.1 8.3*  AST 37  --   ALT 26  --   ALKPHOS 54  --   BILITOT 0.9  --    RADIOLOGY:  Ct Abdomen Pelvis Wo Contrast  Result Date: 03/20/2019 CLINICAL DATA:  Abdominal distension, nausea, vomiting EXAM: CT ABDOMEN AND PELVIS WITHOUT CONTRAST TECHNIQUE: Multidetector CT imaging of the abdomen and pelvis was performed following the standard protocol without IV contrast. COMPARISON:  Same-day radiograph of the chest FINDINGS: Lower chest: Few tree-in-bud opacities are present in the periphery of the left lung base. Atherosclerotic calcification of the coronary arteries. Normal heart size. No pericardial effusion. Hepatobiliary: Punctate calcification in the right lobe liver. No concerning hepatic lesions. Gallbladder largely decompressed. No frank wall thickening or visible calcified gallstones. No biliary ductal dilatation. Pancreas: Unremarkable. No pancreatic ductal dilatation or surrounding inflammatory changes. Spleen: Normal in size without focal abnormality. Adrenals/Urinary Tract: Normal adrenal glands. Few nonobstructing calcifications are present in the lower poles of both kidney. No  obstructive urolithiasis or hydronephrosis. Several partially exophytic fluid attenuation cysts are seen in both kidneys, largest on the right measures approximately 4 cm, largest on the left measures approximately 3.7 cm no  concerning renal lesions. Mild mural thickening of the bladder is slightly greater than expected for the degree of distention. Stomach/Bowel: Distal esophagus stomach and duodenum are unremarkable. Multiple loops of fluid-filled small bowel. Much of the colon is fluid-filled as well with some pericolonic hazy stranding most pronounced in the distal sigmoid. Of the segment does appear to be under active peristalsis. The appendix is surgically absent. Vascular/Lymphatic: Atherosclerotic plaque within the normal caliber aorta. No suspicious or enlarged lymph nodes in the included lymphatic chains. Reproductive: Coarse eccentric calcification of the borderline enlarged prostate. No concerning abnormalities of the prostate or seminal vesicles. Other: No abdominopelvic free fluid or free gas. No bowel containing hernias. Bilateral fat containing inguinal hernias are noted. Musculoskeletal: Multilevel degenerative changes are present in the imaged portions of the spine. Features are most severe at the L5 level grade 1 anterolisthesis of L4 on L5 noted as well. Mild wedging of several contiguous lower thoracic vertebrae appears to be on a chronic, likely degenerative basis. IMPRESSION: 1. Fluid-filled small and large bowel with some hazy pericolonic stranding. Correlate with clinical symptoms of enterocolitis 2. Mild mural thickening of the bladder is slightly greater than expected for the degree of distention. Could reflect a mild cystitis or chronic bladder outlet obstruction due to a borderline enlarged prostate. 3. Few tree-in-bud opacities in the periphery of the left lung base, suggestive of infectious or inflammatory bronchiolitis. 4. Aortic Atherosclerosis (ICD10-I70.0). Electronically Signed   By: Lovena Le M.D.   On: 03/20/2019 19:20   Ct Head Wo Contrast  Result Date: 03/20/2019 CLINICAL DATA:  Altered level of consciousness, unexplained EXAM: CT HEAD WITHOUT CONTRAST TECHNIQUE: Contiguous axial images  were obtained from the base of the skull through the vertex without intravenous contrast. COMPARISON:  None. 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 Multiple benign-appearing dural calcifications. No evidence of acute infarction, hemorrhage, hydrocephalus, extra-axial collection or mass lesion/mass effect. Symmetric prominence of the ventricles, cisterns and sulci compatible with parenchymal volume loss. Patchy areas of white matter hypoattenuation are most compatible with chronic microvascular angiopathy. Vascular: Atherosclerotic calcification of the carotid siphons and intradural vertebral arteries. No hyperdense vessel. Skull: No calvarial fracture or suspicious osseous lesion. No scalp swelling or hematoma. Sinuses/Orbits: Paranasal sinuses and mastoid air cells are predominantly clear. Included orbital structures are unremarkable. Other: Edentulous with mandibular prognathism best appreciated on scout view IMPRESSION: No acute intracranial abnormality. Electronically Signed   By: Lovena Le M.D.   On: 03/20/2019 19:22   Dg Chest Port 1 View  Result Date: 03/20/2019 CLINICAL DATA:  PT from home via EMS with c/o vasal vagal response while on toilet today with + LOC. Pt was unresponsive upon arrival per EMS with BP in the 60s. Pt was given fluids and became more verbal. PT is alert and responding to pain. PT arrives in emesis and stool. BP 67/47 EXAM: PORTABLE CHEST 1 VIEW COMPARISON:  12/08/2018 FINDINGS: Cardiac silhouette is normal in size. No mediastinal or hilar masses. No evidence of adenopathy. Lungs are clear.  No pleural effusion or pneumothorax. Skeletal structures are grossly intact. IMPRESSION: No active disease. Electronically Signed   By: Lajean Manes M.D.   On: 03/20/2019 18:58   ASSESSMENT AND PLAN:    1. Severe sepsis -due to colitis.  It is unclear at this  time this is bacterial or viral in nature C. difficile negative, coronavirus negative.  GI panel is still pending.   Lactic acid was initially elevated and came down some with IV fluid administration.   Diarrhea improved.  Patient had an episode of vomiting today. I suspect this is most likely viral .  Patient currently on IV vancomycin and cefepime.  Will initiate de-escalation of antibiotics by discontinuing vancomycin today.  Reassess in a.m. Patient had CT scan done.  Findings concerning for enterocolitis. Continue IV fluid hydration.  2.  Acute kidney injury superimposed on chronic kidney disease stage Renal function improving with IV fluids. Continue IV fluid hydration while monitoring cardiopulmonary status closely.  3. Chronic systolic CHF (congestive heart failure)  Stable.  Last ejection fraction of 40 to 45%.   Decrease rate of IV fluids to prevent fluid overload.    4. CAD (coronary artery disease) -continue home medications  5. HTN (hypertension) - Blood pressure stable at this time.   Monitor blood pressure and adjust meds as needed   DVT prophylaxis ; heparin   All the records are reviewed and case discussed with Care Management/Social Worker. Management plans discussed with the patient, and he is  in agreement.  CODE STATUS: Full Code  TOTAL TIME TAKING CARE OF THIS PATIENT: 34 minutes.   More than 50% of the time was spent in counseling/coordination of care: YES  POSSIBLE D/C IN 2 DAYS, DEPENDING ON CLINICAL CONDITION.   Jaskirat Zertuche M.D on 03/21/2019 at 1:47 PM  Between 7am to 6pm - Pager - [phone]  After 6pm go to [email or web address] - Social research officer, government  [facility] [place] [facility]  Office  [phone]  CC: Primary care physician; Kandyce Rud, MD  Note: This dictation was prepared with Dragon dictation along with smaller phrase technology. Any transcriptional errors that result from this process are unintentional.

## 2019-03-22 LAB — CBC
HCT: 29.2 % — ABNORMAL LOW (ref 39.0–52.0)
Hemoglobin: 9.2 g/dL — ABNORMAL LOW (ref 13.0–17.0)
MCH: 31.7 pg (ref 26.0–34.0)
MCHC: 31.5 g/dL (ref 30.0–36.0)
MCV: 100.7 fL — ABNORMAL HIGH (ref 80.0–100.0)
Platelets: 147 10*3/uL — ABNORMAL LOW (ref 150–400)
RBC: 2.9 MIL/uL — ABNORMAL LOW (ref 4.22–5.81)
RDW: 15.3 % (ref 11.5–15.5)
WBC: 9.8 10*3/uL (ref 4.0–10.5)
nRBC: 0 % (ref 0.0–0.2)

## 2019-03-22 LAB — RETICULOCYTES
Immature Retic Fract: 15.7 % (ref 2.3–15.9)
RBC.: 2.81 MIL/uL — ABNORMAL LOW (ref 4.22–5.81)
Retic Count, Absolute: 64.9 10*3/uL (ref 19.0–186.0)
Retic Ct Pct: 2.3 % (ref 0.4–3.1)

## 2019-03-22 LAB — PHOSPHORUS: Phosphorus: 4.2 mg/dL (ref 2.5–4.6)

## 2019-03-22 LAB — BASIC METABOLIC PANEL
Anion gap: 7 (ref 5–15)
BUN: 36 mg/dL — ABNORMAL HIGH (ref 8–23)
CO2: 21 mmol/L — ABNORMAL LOW (ref 22–32)
Calcium: 8 mg/dL — ABNORMAL LOW (ref 8.9–10.3)
Chloride: 111 mmol/L (ref 98–111)
Creatinine, Ser: 1.69 mg/dL — ABNORMAL HIGH (ref 0.61–1.24)
GFR calc Af Amer: 43 mL/min — ABNORMAL LOW (ref >60)
GFR calc non Af Amer: 37 mL/min — ABNORMAL LOW (ref >60)
Glucose, Bld: 93 mg/dL (ref 70–99)
Potassium: 3.9 mmol/L (ref 3.5–5.1)
Sodium: 139 mmol/L (ref 135–145)

## 2019-03-22 LAB — VITAMIN B12: Vitamin B-12: 183 pg/mL (ref 180–914)

## 2019-03-22 LAB — IRON AND TIBC
Iron: 53 ug/dL (ref 45–182)
Saturation Ratios: 35 % (ref 17.9–39.5)
TIBC: 152 ug/dL — ABNORMAL LOW (ref 250–450)
UIBC: 99 ug/dL

## 2019-03-22 LAB — FERRITIN: Ferritin: 224 ng/mL (ref 24–336)

## 2019-03-22 LAB — URINE CULTURE: Culture: NO GROWTH

## 2019-03-22 LAB — VANCOMYCIN, RANDOM: Vancomycin Rm: 7

## 2019-03-22 LAB — MAGNESIUM: Magnesium: 2.1 mg/dL (ref 1.7–2.4)

## 2019-03-22 LAB — FOLATE: Folate: 11.1 ng/mL (ref >5.9)

## 2019-03-22 MED ORDER — LOPERAMIDE HCL 2 MG PO CAPS
4.0000 mg | ORAL_CAPSULE | Freq: Four times a day (QID) | ORAL | Status: AC
  Administered 2019-03-22 – 2019-03-23 (×5): 4 mg via ORAL
  Filled 2019-03-22 (×5): qty 2

## 2019-03-22 MED ORDER — OXYCODONE-ACETAMINOPHEN 5-325 MG PO TABS
1.0000 | ORAL_TABLET | Freq: Four times a day (QID) | ORAL | Status: DC | PRN
  Administered 2019-03-22 – 2019-03-23 (×2): 1 via ORAL
  Filled 2019-03-22 (×2): qty 1

## 2019-03-22 NOTE — Progress Notes (Addendum)
[facility] - [place] at [facility]   PATIENT NAME: Jerome [name]    MR#:  [id number]  DATE OF BIRTH:  September 21, 1934  SUBJECTIVE:  CHIEF COMPLAINT:   Chief Complaint  Patient presents with  . Loss of Consciousness   Patient still having some diarrhea.Marland Kitchen  REVIEW OF SYSTEMS:  Review of Systems  Constitutional: Negative for chills and fever.  HENT: Negative for hearing loss and tinnitus.   Eyes: Negative for blurred vision and double vision.  Respiratory: Negative for cough and shortness of breath.   Cardiovascular: Negative for chest pain.  Gastrointestinal:       Nausea and vomiting this morning.    Genitourinary: Negative for dysuria and urgency.  Musculoskeletal: Negative for myalgias and neck pain.  Skin: Negative for itching and rash.  Neurological: Negative for dizziness and headaches.  Psychiatric/Behavioral: Negative for depression and hallucinations.    DRUG ALLERGIES:  No Known Allergies VITALS:  Blood pressure 120/63, pulse 60, temperature 97.9 F (36.6 C), temperature source Oral, resp. rate 16, height 5\' 9"  (1.753 m), weight 104.3 kg, SpO2 99 %. PHYSICAL EXAMINATION:   Physical Exam  Constitutional: He is oriented to person, place, and time. He appears well-developed.  HENT:  Head: Normocephalic and atraumatic.  Right Ear: External ear normal.  Eyes: Pupils are equal, round, and reactive to light. Conjunctivae are normal.  Neck: Normal range of motion. Neck supple. No thyromegaly present.  Cardiovascular: Normal rate, regular rhythm and normal heart sounds.  Respiratory: Effort normal and breath sounds normal. He has no wheezes.  GI: Soft. Bowel sounds are normal. He exhibits no distension.  Musculoskeletal: Normal range of motion.        General: No edema.  Neurological: He is alert and oriented to person, place, and time. No cranial nerve deficit.  Skin: Skin is warm and dry. He is not diaphoretic. No erythema.   LABORATORY PANEL:   Male CBC Recent Labs  Lab 03/22/19 0543  WBC 9.8  HGB 9.2*  HCT 29.2*  PLT 147*   ------------------------------------------------------------------------------------------------------------------ Chemistries  Recent Labs  Lab 03/20/19 1710  03/22/19 0543  NA 137   < > 139  K 5.1   < > 3.9  CL 102   < > 111  CO2 22   < > 21*  GLUCOSE 137*   < > 93  BUN 44*   < > 36*  CREATININE 2.53*   < > 1.69*  CALCIUM 9.1   < > 8.0*  MG  --   --  2.1  AST 37  --   --   ALT 26  --   --   ALKPHOS 54  --   --   BILITOT 0.9  --   --    < > = values in this interval not displayed.   RADIOLOGY:  No results found. ASSESSMENT AND PLAN:    1. Severe sepsis -due to colitis.  It is unclear at this time this is bacterial or viral in nature C. difficile negative, coronavirus negative.  GI panel is still pending.   I will try patient on Imodium   2.  Acute kidney injury superimposed on chronic kidney disease stage Renal function improvied with IV fluids   3. Chronic systolic CHF (congestive heart failure)  Stable.  Last ejection fraction of 40 to 45%.       4. CAD (coronary artery disease) -continue home medications  5. HTN (hypertension) - Blood pressure stable at this time.  Monitor blood pressure and adjust meds as needed   DVT prophylaxis ; heparin   All the records are reviewed and case discussed with Care Management/Social Worker. Management plans discussed with the patient, and he is  in agreement.  CODE STATUS: Full Code  TOTAL TIME TAKING CARE OF THIS PATIENT: 34 minutes.   More than 50% of the time was spent in counseling/coordination of care: YES  POSSIBLE D/C IN 2 DAYS, DEPENDING ON CLINICAL CONDITION.   Dustin Flock M.D on 03/22/2019 at 2:34 PM  Between 7am to 6pm - Pager - [phone]  After 6pm go to [email or web address] - Proofreader  [facility] [place] [facility]  Office  [phone]  CC: Primary care physician; Derinda Late, MD   Note: This dictation was prepared with Dragon dictation along with smaller phrase technology. Any transcriptional errors that result from this process are unintentional.

## 2019-03-23 ENCOUNTER — Inpatient Hospital Stay: Payer: Medicare Other

## 2019-03-23 LAB — RENAL FUNCTION PANEL
Albumin: 2.2 g/dL — ABNORMAL LOW (ref 3.5–5.0)
Anion gap: 6 (ref 5–15)
BUN: 26 mg/dL — ABNORMAL HIGH (ref 8–23)
CO2: 24 mmol/L (ref 22–32)
Calcium: 8.1 mg/dL — ABNORMAL LOW (ref 8.9–10.3)
Chloride: 106 mmol/L (ref 98–111)
Creatinine, Ser: 1.49 mg/dL — ABNORMAL HIGH (ref 0.61–1.24)
GFR calc Af Amer: 50 mL/min — ABNORMAL LOW (ref >60)
GFR calc non Af Amer: 43 mL/min — ABNORMAL LOW (ref >60)
Glucose, Bld: 100 mg/dL — ABNORMAL HIGH (ref 70–99)
Phosphorus: 3.6 mg/dL (ref 2.5–4.6)
Potassium: 3.9 mmol/L (ref 3.5–5.1)
Sodium: 136 mmol/L (ref 135–145)

## 2019-03-23 LAB — CBC WITH DIFFERENTIAL/PLATELET
Abs Immature Granulocytes: 0.04 10*3/uL (ref 0.00–0.07)
Basophils Absolute: 0 10*3/uL (ref 0.0–0.1)
Basophils Relative: 0 %
Eosinophils Absolute: 0.2 10*3/uL (ref 0.0–0.5)
Eosinophils Relative: 2 %
HCT: 30.1 % — ABNORMAL LOW (ref 39.0–52.0)
Hemoglobin: 9.9 g/dL — ABNORMAL LOW (ref 13.0–17.0)
Immature Granulocytes: 0 %
Lymphocytes Relative: 14 %
Lymphs Abs: 1.4 10*3/uL (ref 0.7–4.0)
MCH: 32.5 pg (ref 26.0–34.0)
MCHC: 32.9 g/dL (ref 30.0–36.0)
MCV: 98.7 fL (ref 80.0–100.0)
Monocytes Absolute: 0.8 10*3/uL (ref 0.1–1.0)
Monocytes Relative: 8 %
Neutro Abs: 7.4 10*3/uL (ref 1.7–7.7)
Neutrophils Relative %: 76 %
Platelets: 167 10*3/uL (ref 150–400)
RBC: 3.05 MIL/uL — ABNORMAL LOW (ref 4.22–5.81)
RDW: 14.9 % (ref 11.5–15.5)
WBC: 9.9 10*3/uL (ref 4.0–10.5)
nRBC: 0 % (ref 0.0–0.2)

## 2019-03-23 LAB — CBC
HCT: 28.2 % — ABNORMAL LOW (ref 39.0–52.0)
Hemoglobin: 9 g/dL — ABNORMAL LOW (ref 13.0–17.0)
MCH: 31.6 pg (ref 26.0–34.0)
MCHC: 31.9 g/dL (ref 30.0–36.0)
MCV: 98.9 fL (ref 80.0–100.0)
Platelets: 149 10*3/uL — ABNORMAL LOW (ref 150–400)
RBC: 2.85 MIL/uL — ABNORMAL LOW (ref 4.22–5.81)
RDW: 14.8 % (ref 11.5–15.5)
WBC: 8.7 10*3/uL (ref 4.0–10.5)
nRBC: 0 % (ref 0.0–0.2)

## 2019-03-23 MED ORDER — CYANOCOBALAMIN 1000 MCG/ML IJ SOLN
1000.0000 ug | Freq: Once | INTRAMUSCULAR | Status: AC
  Administered 2019-03-23: 11:00:00 1000 ug via INTRAMUSCULAR
  Filled 2019-03-23: qty 1

## 2019-03-23 NOTE — Progress Notes (Signed)
[place] at [place] NAME: Jerome [name]    MR#:  [id number]  DATE OF BIRTH:  02-08-1935  SUBJECTIVE:  CHIEF COMPLAINT:   Chief Complaint  Patient presents with  . Loss of Consciousness   Patient threw up yesterday.  Had 1 loose bowel movement yesterday.  REVIEW OF SYSTEMS:  Review of Systems  Constitutional: Negative for chills and fever.  HENT: Negative for hearing loss and tinnitus.   Eyes: Negative for blurred vision and double vision.  Respiratory: Negative for cough and shortness of breath.   Cardiovascular: Negative for chest pain.  Gastrointestinal:       Nausea and vomiting yesterday  Genitourinary: Negative for dysuria and urgency.  Musculoskeletal: Negative for myalgias and neck pain.  Skin: Negative for itching and rash.  Neurological: Negative for dizziness and headaches.  Psychiatric/Behavioral: Negative for depression and hallucinations.    DRUG ALLERGIES:  No Known Allergies VITALS:  Blood pressure 113/60, pulse 61, temperature 98.4 F (36.9 C), temperature source Oral, resp. rate 18, height 5\' 9"  (1.753 m), weight 104.3 kg, SpO2 97 %. PHYSICAL EXAMINATION:   Physical Exam  Constitutional: He is oriented to person, place, and time. He appears well-developed.  HENT:  Head: Normocephalic and atraumatic.  Right Ear: External ear normal.  Eyes: Pupils are equal, round, and reactive to light. Conjunctivae are normal.  Neck: Normal range of motion. Neck supple. No thyromegaly present.  Cardiovascular: Normal rate, regular rhythm and normal heart sounds.  Respiratory: Effort normal and breath sounds normal. He has no wheezes.  GI: Soft. Bowel sounds are normal. He exhibits no distension.  Musculoskeletal: Normal range of motion.        General: No edema.  Neurological: He is alert and oriented to person, place, and time. No cranial nerve deficit.  Skin: Skin is warm and dry. He is not diaphoretic. No erythema.   LABORATORY PANEL:  Male CBC Recent Labs  Lab 03/23/19 0947  WBC 9.9  HGB 9.9*  HCT 30.1*  PLT 167   ------------------------------------------------------------------------------------------------------------------ Chemistries  Recent Labs  Lab 03/20/19 1710  03/22/19 0543 03/23/19 0459  NA 137   < > 139 136  K 5.1   < > 3.9 3.9  CL 102   < > 111 106  CO2 22   < > 21* 24  GLUCOSE 137*   < > 93 100*  BUN 44*   < > 36* 26*  CREATININE 2.53*   < > 1.69* 1.49*  CALCIUM 9.1   < > 8.0* 8.1*  MG  --   --  2.1  --   AST 37  --   --   --   ALT 26  --   --   --   ALKPHOS 54  --   --   --   BILITOT 0.9  --   --   --    < > = values in this interval not displayed.   RADIOLOGY:  No results found. ASSESSMENT AND PLAN:    1. Severe sepsis -due to colitis.  It is unclear at this time this is bacterial or viral in nature C. difficile negative, coronavirus negative.  GI panel is still pending was collected 3 days ago we will recheck it  Continue Imodium Due to vomiting I will obtain abdominal x-ray   2.  Acute kidney injury superimposed on chronic kidney disease stage Renal function improvied with IV fluids discontinue IV fluids   3.  Chronic systolic CHF (congestive heart failure)  Stable.  Last ejection fraction of 40 to 45%.       4. CAD (coronary artery disease) -continue home medications  5. HTN (hypertension) - Blood pressure stable at this time.   Monitor blood pressure and adjust meds as needed    6.  Anemia patient's B12 is borderline low we will give him vitamin B12 shots oral supplements starting tomorrow   DVT prophylaxis ; heparin   All the records are reviewed and case discussed with Care Management/Social Worker. Management plans discussed with the patient, and he is  in agreement.  CODE STATUS: Full Code  TOTAL TIME TAKING CARE OF THIS PATIENT: 34 minutes.   More than 50% of the time was spent in counseling/coordination of care: YES  POSSIBLE D/C  IN 2 DAYS, DEPENDING ON CLINICAL CONDITION.   Auburn Bilberry M.D on 03/23/2019 at 11:53 AM  Between 7am to 6pm - Pager - [phone]  After 6pm go to [email or web address] - Social research officer, government  [facility] [place] [facility]  Office  [phone]  CC: Primary care physician; Kandyce Rud, MD  Note: This dictation was prepared with Dragon dictation along with smaller phrase technology. Any transcriptional errors that result from this process are unintentional.

## 2019-03-23 NOTE — Progress Notes (Signed)
Pharmacy Antibiotic Note  Jerome [name]. is a 84 y.o. male admitted on 03/20/2019 with sepsis which is likely due to suspected colitis.  Pharmacy has been consulted for Cefepime dosing.  Plan: Continue Cefepime 2g IV Q12hr.   Height: 5\' 9"  (175.3 cm) Weight: 230 lb (104.3 kg) IBW/kg (Calculated) : 70.7  Temp (24hrs), Avg:98.4 F (36.9 C), Min:98.2 F (36.8 C), Max:98.7 F (37.1 C)  Recent Labs  Lab 03/20/19 1710 03/20/19 1920 03/21/19 0459 03/22/19 0543 03/22/19 0936 03/23/19 0459 03/23/19 0947  WBC 8.7  --  14.2* 9.8  --  8.7 9.9  CREATININE 2.53*  --  2.10* 1.69*  --  1.49*  --   LATICACIDVEN 4.2* 2.6* 2.7*  --   --   --   --   VANCORANDOM  --   --   --   --  7  --   --     Estimated Creatinine Clearance: 45.5 mL/min (A) (by C-G formula based on SCr of 1.49 mg/dL (H)).    No Known Allergies  Antimicrobials this admission: Cefepime 10/1 >>  Vancomycin 10/1 x 1 Flagyl 10/1 x 1 dose  Dose adjustments this admission: N/A  Microbiology results: 10/2 BCx: no growth x 2 days  10/2 UCx: no growth  10/1 Cdiff: negative  10/1 COVID: negative    Thank you for allowing pharmacy to be a part of this patient's care.  Evani Shrider L 03/23/2019 2:14 PM

## 2019-03-23 NOTE — Progress Notes (Signed)
Percocet given once for R hip chronic pain with improvement.  No stool/n/v during the shift.  Pt ate well.

## 2019-03-24 ENCOUNTER — Inpatient Hospital Stay: Payer: Medicare Other

## 2019-03-24 LAB — BASIC METABOLIC PANEL
Anion gap: 5 (ref 5–15)
BUN: 26 mg/dL — ABNORMAL HIGH (ref 8–23)
CO2: 24 mmol/L (ref 22–32)
Calcium: 8.1 mg/dL — ABNORMAL LOW (ref 8.9–10.3)
Chloride: 107 mmol/L (ref 98–111)
Creatinine, Ser: 1.45 mg/dL — ABNORMAL HIGH (ref 0.61–1.24)
GFR calc Af Amer: 52 mL/min — ABNORMAL LOW (ref >60)
GFR calc non Af Amer: 45 mL/min — ABNORMAL LOW (ref >60)
Glucose, Bld: 83 mg/dL (ref 70–99)
Potassium: 4.1 mmol/L (ref 3.5–5.1)
Sodium: 136 mmol/L (ref 135–145)

## 2019-03-24 LAB — CBC
HCT: 27.4 % — ABNORMAL LOW (ref 39.0–52.0)
Hemoglobin: 8.9 g/dL — ABNORMAL LOW (ref 13.0–17.0)
MCH: 32 pg (ref 26.0–34.0)
MCHC: 32.5 g/dL (ref 30.0–36.0)
MCV: 98.6 fL (ref 80.0–100.0)
Platelets: 134 10*3/uL — ABNORMAL LOW (ref 150–400)
RBC: 2.78 MIL/uL — ABNORMAL LOW (ref 4.22–5.81)
RDW: 14.9 % (ref 11.5–15.5)
WBC: 7.9 10*3/uL (ref 4.0–10.5)
nRBC: 0 % (ref 0.0–0.2)

## 2019-03-24 MED ORDER — LISINOPRIL 5 MG PO TABS: 5.0000 mg | ORAL_TABLET | Freq: Every day | ORAL | Status: DC

## 2019-03-24 MED ORDER — ENOXAPARIN SODIUM 40 MG/0.4ML ~~LOC~~ SOLN
40.0000 mg | SUBCUTANEOUS | Status: DC
  Administered 2019-03-24: 21:00:00 40 mg via SUBCUTANEOUS
  Filled 2019-03-24: qty 0.4

## 2019-03-24 MED ORDER — OXYCODONE-ACETAMINOPHEN 5-325 MG PO TABS
1.0000 | ORAL_TABLET | Freq: Four times a day (QID) | ORAL | Status: DC | PRN
  Administered 2019-03-24: 21:00:00 1 via ORAL
  Filled 2019-03-24: qty 1

## 2019-03-24 NOTE — TOC Initial Note (Signed)
Transition of Care (TOC) - Initial/Assessment Note    Patient Details  Name: Jerome [name]. MRN: [id number] Date of Birth: 08-Nov-1934  [facility]) CM/SW Contact:    Candie Chroman, LCSW Phone Number: 03/24/2019, 3:48 PM  Clinical Narrative:  CSW met with patient. No supports at bedside. CSW explained that PT recommendations would bed discussed. Patient agreeable to SNF placement. Reviewed CMS scores and [facility] is first preference. It is closest to him and his son's home. Admissions coordinator will review. No further concerns. CSW encouraged patient to contact CSW as needed. CSW will continue to follow patient and facilitate discharge to SNF once medically stable.                Expected Discharge Plan: Skilled Nursing Facility Barriers to Discharge: Continued Medical Work up   Patient Goals and CMS Choice   CMS Medicare.gov Compare Post Acute Care list provided to:: Patient    Expected Discharge Plan and Services Expected Discharge Plan: [place] Choice: [place] arrangements for the past 2 months: Single Family Home                                      Prior Living Arrangements/Services Living arrangements for the past 2 months: Single Family Home Lives with:: Self Patient language and need for interpreter reviewed:: Yes Do you feel safe going back to the place where you live?: Yes      Need for Family Participation in Patient Care: Yes (Comment) Care giver support system in place?: Yes (comment) Current home services: Hospice, DME Criminal Activity/Legal Involvement Pertinent to Current Situation/Hospitalization: No - Comment as needed  Activities of Daily Living Home Assistive Devices/Equipment: Hospital bed, Wheelchair, Environmental consultant (specify type) ADL Screening (condition at time of admission) Patient's cognitive ability adequate to safely complete daily activities?: Yes Is  the patient deaf or have difficulty hearing?: No Does the patient have difficulty seeing, even when wearing glasses/contacts?: No Does the patient have difficulty concentrating, remembering, or making decisions?: No Patient able to express need for assistance with ADLs?: Yes Does the patient have difficulty dressing or bathing?: Yes Independently performs ADLs?: No Communication: Independent Dressing (OT): Needs assistance Is this a change from baseline?: Pre-admission baseline Grooming: Needs assistance Is this a change from baseline?: Pre-admission baseline Feeding: Independent Bathing: Dependent Is this a change from baseline?: Pre-admission baseline Toileting: Needs assistance Is this a change from baseline?: Pre-admission baseline In/Out Bed: Independent with device (comment)(walker) Is this a change from baseline?: Pre-admission baseline Walks in Home: Independent with device (comment) Does the patient have difficulty walking or climbing stairs?: Yes Weakness of Legs: Both Weakness of Arms/Hands: Both  Permission Sought/Granted Permission sought to share information with : Facility Art therapist granted to share information with : Yes, Verbal Permission Granted     Permission granted to share info w AGENCY: SNF's        Emotional Assessment Appearance:: Appears stated age Attitude/Demeanor/Rapport: Engaged, Gracious Affect (typically observed): Accepting, Appropriate, Calm, Pleasant Orientation: : Oriented to Self, Oriented to Place, Oriented to  Time, Oriented to Situation Alcohol / Substance Use: Never Used Psych Involvement: No (comment)  Admission diagnosis:  Enterocolitis [K52.9] AKI (acute kidney injury) ([place]) [N17.9] Severe sepsis ([place]) [A41.9, R65.20] Patient Active Problem List   Diagnosis Date Noted  . Sepsis ([place]) 03/20/2019  .  Chronic systolic CHF (congestive heart failure) ([place]) 03/20/2019  . CAD (coronary artery disease) 03/20/2019   . HTN (hypertension) 03/20/2019  . Acute on chronic renal failure ([place]) 03/20/2019  . Severe sepsis ([place]) 03/20/2019  . Acute exacerbation of CHF (congestive heart failure) ([place]) 12/09/2018   PCP:  Derinda Late, MD Pharmacy:   [id number] [id number]: [phone]: [phone]    Social Determinants of Health (SDOH) Interventions    Readmission Risk Interventions Readmission Risk Prevention Plan 12/11/2018  Post Dischage Appt Complete  Medication Screening Complete  Transportation Screening Complete  Some recent data might be hidden

## 2019-03-24 NOTE — Consult Note (Signed)
Cardiology Consultation Note    Patient ID: Jerome Banet., MRN: [id number], DOB/AGE: 26-Jul-1934 84 y.o. Admit date: 03/20/2019   Date of Consult: 03/24/2019 Primary Physician: Derinda Late, MD Primary Cardiologist: none  Chief Complaint: dizzyness Reason for Consultation: syncope Requesting MD: Dr. Dustin Flock  HPI: Jerome Maher Dionne Rossa. is a 84 y.o. male with history of reported coronary artery disease, history of mild reduced LV function EF 45 to 50% with normal left ventricular cavity size by echo and June of this year, history of mild MR and TR who was admitted after apparent dizzy episodes and syncopal episodes in the past.  Patient is a moderate reduced historian but apparently has had episodes of syncope as well as episodes of dizziness.  Some of these have symptoms consistent with orthostatic or vasovagal symptoms.  Occasionally the dizziness appears to be vertiginous in nature.  He complained of dizziness while I was examining him today and his vital signs were a systolic blood pressure of 301 over diastolic pressure 65 with a heart rate of 66.  He stated the dizziness was lightheadedness and he was lying flat in bed.  He occasionally have fairly significant vertiginous symptoms.  He again does not have significant valvular disease.  CT of the head on October 1 of this year revealed no acute intracranial abnormalities.  Carotid Dopplers are pending.  He has had no arrhythmia noted since admission.  Etiology of symptoms is unclear.  He is not aware of any tachycardia or bradycardia arrhythmias.  He denies chest pain.  Patient had acute on chronic renal insufficiency on admission with a creatinine of 2.1 currently 1.45 which is near his baseline.  Past Medical History:  Diagnosis Date  . CAD (coronary artery disease)   . Chronic systolic CHF (congestive heart failure) ([place])   . Hypertension       Surgical History:  Past Surgical History:  Procedure Laterality Date  .  APPENDECTOMY    . HERNIA REPAIR       Home Meds: Prior to Admission medications   Medication Sig Start Date End Date Taking? Authorizing Provider  carvedilol (COREG) 6.25 MG tablet Take 1 tablet (6.25 mg total) by mouth 2 (two) times daily with a meal. 12/12/18  Yes Fritzi Mandes, MD  furosemide (LASIX) 40 MG tablet Take 1 tablet (40 mg total) by mouth daily. 12/13/18  Yes Fritzi Mandes, MD  gabapentin (NEURONTIN) 100 MG capsule Take 100 mg by mouth 3 (three) times daily. 03/11/19  Yes [provider]  lisinopril (ZESTRIL) 10 MG tablet Take 1 tablet (10 mg total) by mouth 2 (two) times daily. Patient taking differently: Take 10 mg by mouth daily.  12/12/18  Yes Fritzi Mandes, MD  Multiple Vitamin (MULTIVITAMIN WITH MINERALS) TABS tablet Take 1 tablet by mouth daily. 12/13/18  Yes Fritzi Mandes, MD  potassium chloride SA (K-DUR) 20 MEQ tablet Take 1 tablet (20 mEq total) by mouth daily. 12/13/18  Yes Fritzi Mandes, MD  tamsulosin (FLOMAX) 0.4 MG CAPS capsule Take 1 capsule by mouth daily. 30 minutes after last meal every evening 09/16/18 09/16/19 Yes [provider]  traMADol (ULTRAM) 50 MG tablet Take 1 tablet (50 mg total) by mouth every 6 (six) hours as needed for moderate pain. Patient taking differently: Take 50 mg by mouth 2 (two) times daily.  12/12/18  Yes Fritzi Mandes, MD    Inpatient Medications:  . gabapentin  100 mg Oral TID  . lisinopril  10 mg Oral Daily  .  tamsulosin  0.4 mg Oral QPC supper     Allergies: No Known Allergies  Social History   Socioeconomic History  . Marital status: Divorced    Spouse name: Not on file  . Number of children: Not on file  . Years of education: Not on file  . Highest education level: Not on file  Occupational History  . Not on file  Social Needs  . Financial resource strain: Not on file  . Food insecurity    Worry: Not on file    Inability: Not on file  . Transportation needs    Medical: Not on file    Non-medical: Not on file   Tobacco Use  . Smoking status: Never Smoker  . Smokeless tobacco: Never Used  Substance and Sexual Activity  . Alcohol use: Never    Frequency: Never  . Drug use: Never  . Sexual activity: Not Currently  Lifestyle  . Physical activity    Days per week: Not on file    Minutes per session: Not on file  . Stress: Not on file  Relationships  . Social Musician on phone: Not on file    Gets together: Not on file    Attends religious service: Not on file    Active member of club or organization: Not on file    Attends meetings of clubs or organizations: Not on file    Relationship status: Not on file  . Intimate partner violence    Fear of current or ex partner: Not on file    Emotionally abused: Not on file    Physically abused: Not on file    Forced sexual activity: Not on file  Other Topics Concern  . Not on file  Social History Narrative  . Not on file     No family history on file.   Review of Systems: A 12-system review of systems was performed and is negative except as noted in the HPI.  Labs: No results for input(s): CKTOTAL, CKMB, TROPONINI in the last 72 hours. Lab Results  Component Value Date   WBC 7.9 03/24/2019   HGB 8.9 (L) 03/24/2019   HCT 27.4 (L) 03/24/2019   MCV 98.6 03/24/2019   PLT 134 (L) 03/24/2019    Recent Labs  Lab 03/20/19 1710  03/24/19 0447  NA 137   < > 136  K 5.1   < > 4.1  CL 102   < > 107  CO2 22   < > 24  BUN 44*   < > 26*  CREATININE 2.53*   < > 1.45*  CALCIUM 9.1   < > 8.1*  PROT 6.9  --   --   BILITOT 0.9  --   --   ALKPHOS 54  --   --   ALT 26  --   --   AST 37  --   --   GLUCOSE 137*   < > 83   < > = values in this interval not displayed.   No results found for: CHOL, HDL, LDLCALC, TRIG No results found for: DDIMER  Radiology/Studies:  Ct Abdomen Pelvis Wo Contrast  Result Date: 03/20/2019 CLINICAL DATA:  Abdominal distension, nausea, vomiting EXAM: CT ABDOMEN AND PELVIS WITHOUT CONTRAST TECHNIQUE:  Multidetector CT imaging of the abdomen and pelvis was performed following the standard protocol without IV contrast. COMPARISON:  Same-day radiograph of the chest FINDINGS: Lower chest: Few tree-in-bud opacities are present in the periphery of the  left lung base. Atherosclerotic calcification of the coronary arteries. Normal heart size. No pericardial effusion. Hepatobiliary: Punctate calcification in the right lobe liver. No concerning hepatic lesions. Gallbladder largely decompressed. No frank wall thickening or visible calcified gallstones. No biliary ductal dilatation. Pancreas: Unremarkable. No pancreatic ductal dilatation or surrounding inflammatory changes. Spleen: Normal in size without focal abnormality. Adrenals/Urinary Tract: Normal adrenal glands. Few nonobstructing calcifications are present in the lower poles of both kidney. No obstructive urolithiasis or hydronephrosis. Several partially exophytic fluid attenuation cysts are seen in both kidneys, largest on the right measures approximately 4 cm, largest on the left measures approximately 3.7 cm no concerning renal lesions. Mild mural thickening of the bladder is slightly greater than expected for the degree of distention. Stomach/Bowel: Distal esophagus stomach and duodenum are unremarkable. Multiple loops of fluid-filled small bowel. Much of the colon is fluid-filled as well with some pericolonic hazy stranding most pronounced in the distal sigmoid. Of the segment does appear to be under active peristalsis. The appendix is surgically absent. Vascular/Lymphatic: Atherosclerotic plaque within the normal caliber aorta. No suspicious or enlarged lymph nodes in the included lymphatic chains. Reproductive: Coarse eccentric calcification of the borderline enlarged prostate. No concerning abnormalities of the prostate or seminal vesicles. Other: No abdominopelvic free fluid or free gas. No bowel containing hernias. Bilateral fat containing inguinal hernias  are noted. Musculoskeletal: Multilevel degenerative changes are present in the imaged portions of the spine. Features are most severe at the L5 level grade 1 anterolisthesis of L4 on L5 noted as well. Mild wedging of several contiguous lower thoracic vertebrae appears to be on a chronic, likely degenerative basis. IMPRESSION: 1. Fluid-filled small and large bowel with some hazy pericolonic stranding. Correlate with clinical symptoms of enterocolitis 2. Mild mural thickening of the bladder is slightly greater than expected for the degree of distention. Could reflect a mild cystitis or chronic bladder outlet obstruction due to a borderline enlarged prostate. 3. Few tree-in-bud opacities in the periphery of the left lung base, suggestive of infectious or inflammatory bronchiolitis. 4. Aortic Atherosclerosis (ICD10-I70.0). Electronically Signed   By: Kreg Shropshire M.D.   On: 03/20/2019 19:20   Ct Head Wo Contrast  Result Date: 03/20/2019 CLINICAL DATA:  Altered level of consciousness, unexplained EXAM: CT HEAD WITHOUT CONTRAST TECHNIQUE: Contiguous axial images were obtained from the base of the skull through the vertex without intravenous contrast. COMPARISON:  None. 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 Multiple benign-appearing dural calcifications. No evidence of acute infarction, hemorrhage, hydrocephalus, extra-axial collection or mass lesion/mass effect. Symmetric prominence of the ventricles, cisterns and sulci compatible with parenchymal volume loss. Patchy areas of white matter hypoattenuation are most compatible with chronic microvascular angiopathy. Vascular: Atherosclerotic calcification of the carotid siphons and intradural vertebral arteries. No hyperdense vessel. Skull: No calvarial fracture or suspicious osseous lesion. No scalp swelling or hematoma. Sinuses/Orbits: Paranasal sinuses and mastoid air cells are predominantly clear. Included orbital structures are unremarkable. Other:  Edentulous with mandibular prognathism best appreciated on scout view IMPRESSION: No acute intracranial abnormality. Electronically Signed   By: Kreg Shropshire M.D.   On: 03/20/2019 19:22   Dg Chest Port 1 View  Result Date: 03/20/2019 CLINICAL DATA:  PT from home via EMS with c/o vasal vagal response while on toilet today with + LOC. Pt was unresponsive upon arrival per EMS with BP in the 60s. Pt was given fluids and became more verbal. PT is alert and responding to pain. PT arrives in emesis and stool. BP  67/47 EXAM: PORTABLE CHEST 1 VIEW COMPARISON:  12/08/2018 FINDINGS: Cardiac silhouette is normal in size. No mediastinal or hilar masses. No evidence of adenopathy. Lungs are clear.  No pleural effusion or pneumothorax. Skeletal structures are grossly intact. IMPRESSION: No active disease. Electronically Signed   By: Amie Portlandavid  O[place]ond M.D.   On: 03/20/2019 18:58   Dg Abd 2 Views  Result Date: 03/23/2019 CLINICAL DATA:  84 year old male with history of sepsis from colitis, now presenting with nausea. EXAM: ABDOMEN - 2 VIEW COMPARISON:  No priors. FINDINGS: Gas in stool is noted throughout the colon extending to the level of the distal rectum. No pathologic dilatation of small bowel or colon. No pneumoperitoneum. Advanced degenerative changes in the right hip joint, potentially from longstanding a vascular necrosis with associated severe osteoarthritis. Moderate to severe left hip joint osteoarthritis also noted. IMPRESSION: 1. Nonobstructive bowel gas pattern. 2. No pneumoperitoneum. 3. Degenerative changes in the hip joints bilaterally (right greater than left), as above. Electronically Signed   By: Trudie Reedaniel  Entrikin M.D.   On: 03/23/2019 15:20    Wt Readings from Last 3 Encounters:  03/20/19 104.3 kg  12/12/18 115.6 kg    EKG: Sinus rhythm at a rate of 60 with nonspecific ST-T wave changes.  Physical Exam:  Blood pressure 134/65, pulse (!) 55, temperature 98.6 F (37 C), resp. rate 17, height 5\' 9"   (1.753 m), weight 104.3 kg, SpO2 100 %. Body mass index is 33.97 kg/m. General: Well developed, well nourished, in no acute distress. Head: Normocephalic, atraumatic, sclera non-icteric, no xanthomas, nares are without discharge.  Neck: Negative for carotid bruits. JVD not elevated. Lungs: Clear bilaterally to auscultation without wheezes, rales, or rhonchi. Breathing is unlabored. Heart: RRR with S1 S2. No murmurs, rubs, or gallops appreciated. Abdomen: Soft, non-tender, non-distended with normoactive bowel sounds. No hepatomegaly. No rebound/guarding. No obvious abdominal masses. Msk:  Strength and tone appear normal for age. Extremities: No clubbing or cyanosis. No edema.  Distal pedal pulses are 2+ and equal bilaterally. Neuro: Alert and oriented X 3. No facial asymmetry. No focal deficit. Moves all extremities spontaneously. Psych:  Responds to questions appropriately with a normal affect.     Assessment and Plan  [age] year old male with history of near normal LV function admitted with episodic syncopal episode and dizziness.  Symptoms of dizziness during my exam are in the face of a systolic blood pressure of 115.  There was no significant bradycardia.  Etiology appears to be possibly at least in part due to orthostasis as he was volume depleted on presentation.  He does have some vertiginous features.  Carotid Dopplers are pending.  Brain CT was unremarkable.  Will review carotid Dopplers when available.  Further rhythm etiology of his symptoms would be best suited as an outpatient evaluation with at least a 7 to 14-day Holter monitor.  Hemodynamics appear fairly stable during this hospitalization.  We will reduce his lisinopril to 5 mg daily to see if relative hypotension on occasion especially as an outpatient may be playing a role.  After carotid Dopplers are completed and if they are not significantly abnormal, consider discharge with outpatient follow-up.  Caren MacadamSigned, Jennise Both A Doil Kamara  MD 03/24/2019, 10:22 AM Pager: [phone]

## 2019-03-24 NOTE — Evaluation (Signed)
Physical Therapy Evaluation Patient Details Name: Jerome [name]. MRN: [id number] DOB: [birth year] Today's Date: 03/24/2019   History of Present Illness  Pt is 84 y.o. male with a known history of hypertension, gout, renal insufficiency, R TKA.  He presented to the emergency room via EMS services from home after LOC/vasovagal event at home.    Clinical Impression  Patient alert, oriented, behavior WFLs but patient extremely verbose during session. Stated at home he is able to ambulate household distances with his RW without physical assist, only needs physical assistance for dressing and transferring off of the commode. Several recent falls (recent hospital visits).   Patient needed minA to complete transfer to EOB to shift weight anteriorly. Pt able to sit EOB mod I for several minutes. Sit <> stand several times during session, modA with RW without sufficient UE support, very little minA for stabilization from recliner with use of arm rests. Pt also ambulated ~57ft in room, CGA with RW. Pt very fatigued at end of mobility. The patient did state that normally his mobility is better than it was this session.  Overall the patient demonstrated deficits (see "PT Problem List") that impede the patient's functional abilities, safety, and mobility and would benefit from skilled PT intervention. Recommendation is STR to maximize pt safety, mobility, and independence.      Follow Up Recommendations SNF    Equipment Recommendations  None recommended by PT    Recommendations for Other Services       Precautions / Restrictions Precautions Precautions: Fall Restrictions Weight Bearing Restrictions: No      Mobility  Bed Mobility Overal bed mobility: Needs Assistance Bed Mobility: Supine to Sit     Supine to sit: Min assist;HOB elevated     General bed mobility comments: heavy use of bed rails, handheld assist to help with weight shift towards EOB  Transfers Overall transfer level:  Needs assistance Equipment used: Rolling walker (2 wheeled) Transfers: Sit to/from Stand Sit to Stand: Mod assist;Min assist;From elevated surface         General transfer comment: initially modA from bed to standing. from recliner, very little minA due to pt's ability to utilize arm rests of chair. extended time needed  Ambulation/Gait Ambulation/Gait assistance: Min guard Gait Distance (Feet): 8 Feet Assistive device: Rolling walker (2 wheeled)   Gait velocity: decreased   General Gait Details: very slow cadence, step to gait pattern, heavy UE weight bearing due to RLE pain  Stairs            Wheelchair Mobility    Modified Rankin (Stroke Patients Only)       Balance Overall balance assessment: Needs assistance Sitting-balance support: Feet supported Sitting balance-Leahy Scale: Fair       Standing balance-Leahy Scale: Poor Standing balance comment: reliant on RW for support                             Pertinent Vitals/Pain Pain Assessment: Faces Faces Pain Scale: Hurts little more Pain Location: with mobility Pain Descriptors / Indicators: Grimacing;Guarding Pain Intervention(s): Limited activity within patient's tolerance;Monitored during session;Repositioned    Home Living Family/patient expects to be discharged to:: Private residence Living Arrangements: Children Available Help at Discharge: Family Type of Home: House Home Access: Ramped entrance     Home Layout: One level Home Equipment: Environmental consultant - 2 wheels;Cane - single point;Wheelchair - manual;Hospital bed      Prior Function Level of Independence: Needs  assistance   Gait / Transfers Assistance Needed: Pt stated that he transfers out of his hospital bed at home and ambulates household distances with RW. Has WC for community ambulation. Needs physical assistance to transfer off commode at home  ADL's / Homemaking Assistance Needed: sons performs IADLs, needs assistance wiping after  using the bathroom from his son        Hand Dominance        Extremity/Trunk Assessment   Upper Extremity Assessment Upper Extremity Assessment: Generalized weakness    Lower Extremity Assessment Lower Extremity Assessment: Generalized weakness       Communication   Communication: No difficulties  Cognition Arousal/Alertness: Awake/alert Behavior During Therapy: WFL for tasks assessed/performed Overall Cognitive Status: Within Functional Limits for tasks assessed                                 General Comments: Pt extremely verbose throughout session      General Comments      Exercises     Assessment/Plan    PT Assessment Patient needs continued PT services  PT Problem List Decreased strength;Decreased mobility;Decreased activity tolerance;Decreased range of motion;Decreased balance;Pain       PT Treatment Interventions DME instruction;Therapeutic exercise;Gait training;Balance training;Stair training;Neuromuscular re-education;Functional mobility training;Therapeutic activities;Patient/family education    PT Goals (Current goals can be found in the Care Plan section)  Acute Rehab PT Goals Patient Stated Goal: to get better PT Goal Formulation: With patient Time For Goal Achievement: 04/07/19 Potential to Achieve Goals: Good    Frequency Min 2X/week   Barriers to discharge        Co-evaluation               AM-PAC PT "6 Clicks" Mobility  Outcome Measure Help needed turning from your back to your side while in a flat bed without using bedrails?: A Lot Help needed moving from lying on your back to sitting on the side of a flat bed without using bedrails?: A Lot Help needed moving to and from a bed to a chair (including a wheelchair)?: A Little Help needed standing up from a chair using your arms (e.g., wheelchair or bedside chair)?: A Little Help needed to walk in hospital room?: A Little Help needed climbing 3-5 steps with a  railing? : Total 6 Click Score: 14    End of Session Equipment Utilized During Treatment: Gait belt Activity Tolerance: Patient limited by pain;Patient limited by fatigue Patient left: with chair alarm set;in chair;with call bell/phone within reach Nurse Communication: Mobility status PT Visit Diagnosis: Muscle weakness (generalized) (M62.81);Unsteadiness on feet (R26.81);History of falling (Z91.81);Pain Pain - Right/Left: Right Pain - part of body: Knee    Time: 7824-2353 PT Time Calculation (min) (ACUTE ONLY): 41 min   Charges:   PT Evaluation $PT Eval Low Complexity: 1 Low PT Treatments $Therapeutic Exercise: 8-22 mins $Therapeutic Activity: 8-22 mins       Olga Coaster PT, DPT 1:23 PM,03/24/19 [phone]

## 2019-03-24 NOTE — Progress Notes (Signed)
[facility] [place] NAME: Jerome [name]    MR#:  [id number]  DATE OF BIRTH:  May 04, 1935  SUBJECTIVE:  CHIEF COMPLAINT:   Chief Complaint  Patient presents with  . Loss of Consciousness   Patient states that he does not feel comfortable going home because he passed out before he came to the hospital.  Also spoke to son he also expresses same concerns.    REVIEW OF SYSTEMS:  Review of Systems  Constitutional: Negative for chills and fever.  HENT: Negative for hearing loss and tinnitus.   Eyes: Negative for blurred vision and double vision.  Respiratory: Negative for cough and shortness of breath.   Cardiovascular: Negative for chest pain.  Genitourinary: Negative for dysuria and urgency.  Musculoskeletal: Negative for myalgias and neck pain.  Skin: Negative for itching and rash.  Neurological: Negative for dizziness and headaches.  Psychiatric/Behavioral: Negative for depression and hallucinations.    DRUG ALLERGIES:  No Known Allergies VITALS:  Blood pressure 132/63, pulse 85, temperature 98.6 F (37 C), resp. rate 17, height 5\' 9"  (1.753 m), weight 104.3 kg, SpO2 94 %. PHYSICAL EXAMINATION:   Physical Exam  Constitutional: He is oriented to person, place, and time. He appears well-developed.  HENT:  Head: Normocephalic and atraumatic.  Right Ear: External ear normal.  Eyes: Pupils are equal, round, and reactive to light. Conjunctivae are normal.  Neck: Normal range of motion. Neck supple. No thyromegaly present.  Cardiovascular: Normal rate, regular rhythm and normal heart sounds.  Respiratory: Effort normal and breath sounds normal. He has no wheezes.  GI: Soft. Bowel sounds are normal. He exhibits no distension.  Musculoskeletal: Normal range of motion.        General: No edema.  Neurological: He is alert and oriented to person, place, and time. No cranial nerve deficit.  Skin: Skin is warm and dry. He is not  diaphoretic. No erythema.   LABORATORY PANEL:  Male CBC Recent Labs  Lab 03/24/19 0447  WBC 7.9  HGB 8.9*  HCT 27.4*  PLT 134*   ------------------------------------------------------------------------------------------------------------------ Chemistries  Recent Labs  Lab 03/20/19 1710  03/22/19 0543  03/24/19 0447  NA 137   < > 139   < > 136  K 5.1   < > 3.9   < > 4.1  CL 102   < > 111   < > 107  CO2 22   < > 21*   < > 24  GLUCOSE 137*   < > 93   < > 83  BUN 44*   < > 36*   < > 26*  CREATININE 2.53*   < > 1.69*   < > 1.45*  CALCIUM 9.1   < > 8.0*   < > 8.1*  MG  --   --  2.1  --   --   AST 37  --   --   --   --   ALT 26  --   --   --   --   ALKPHOS 54  --   --   --   --   BILITOT 0.9  --   --   --   --    < > = values in this interval not displayed.   RADIOLOGY:  No results found. ASSESSMENT AND PLAN:    1. Severe sepsis -due to colitis.  Now resolved Discontinue IV antibiotics  2.  Acute kidney injury superimposed on chronic  kidney disease stage Renal function back to baseline   3.  Syncope on admission was due to low blood pressure patient's blood pressure was 70s when he arrived in the ED I tried explained to the patient and his son but they state that there is must be something else wrong.  I reassured him echo done in June showed some abnormalities on his echo but nothing to explain his syncope he was also monitored on telemetry throughout hospitalization with any arrhythmias I have asked cardiology to see the patient.  Patient also will have a carotid Doppler.     4. Chronic systolic CHF (congestive heart failure)  Stable.  Last ejection fraction of 40 to 45%.       5. CAD (coronary artery disease) -continue home medications  6. HTN (hypertension) - Blood pressure stable at this time.   Monitor blood pressure and adjust meds as needed    7.  Anemia patient's B12 is borderline low we will give him vitamin B12 shots oral supplements starting  tomorrow   8.  Neurolysed weakness PT evaluation and treat  DVT prophylaxis ; heparin   All the records are reviewed and case discussed with Care Management/Social Worker. Management plans discussed with the patient, and he is  in agreement.  CODE STATUS: Full Code  TOTAL TIME TAKING CARE OF THIS PATIENT: 34 minutes.   More than 50% of the time was spent in counseling/coordination of care: YES  POSSIBLE D/C IN 2 DAYS, DEPENDING ON CLINICAL CONDITION.   Auburn Bilberry M.D on 03/24/2019 at 1:30 PM  Between 7am to 6pm - Pager - [phone]  After 6pm go to [email or web address] - Social research officer, government  [facility] [facility]  Office  [phone]  CC: Primary care physician; Kandyce Rud, MD  Note: This dictation was prepared with Dragon dictation along with smaller phrase technology. Any transcriptional errors that result from this process are unintentional.

## 2019-03-24 NOTE — [place] FL2 (Signed)
[place] MEDICAID [id number] LEVEL OF CARE SCREENING TOOL     IDENTIFICATION  Patient Name: Jerome [name]. Birthdate: 1935/02/25 Sex: male Admission Date (Current Location): 03/20/2019  [place] and [place] Number:  Chiropodist and Address:  [facility], [address] [place], [place] [address]      Provider Number: [id number]  Attending Physician Name and Address:  Auburn Bilberry, MD  Relative Name and Phone Number:       Current Level of Care: Hospital Recommended Level of Care: Skilled Nursing Facility Prior Approval Number:    Date Approved/Denied:   PASRR Number: [id number]  Discharge Plan: SNF    Current Diagnoses: Patient Active Problem List   Diagnosis Date Noted  . Sepsis (HCC) 03/20/2019  . Chronic systolic CHF (congestive heart failure) (HCC) 03/20/2019  . CAD (coronary artery disease) 03/20/2019  . HTN (hypertension) 03/20/2019  . Acute on chronic renal failure (HCC) 03/20/2019  . Severe sepsis (HCC) 03/20/2019  . Acute exacerbation of CHF (congestive heart failure) (HCC) 12/09/2018    Orientation RESPIRATION BLADDER Height & Weight     Self, Time, Situation, Place  Normal Continent Weight: 230 lb (104.3 kg) Height:  5\' 9"  (175.3 cm)  BEHAVIORAL SYMPTOMS/MOOD NEUROLOGICAL BOWEL NUTRITION STATUS  (None) (None) Continent Diet  AMBULATORY STATUS COMMUNICATION OF NEEDS Skin   Limited Assist Verbally Skin abrasions, Bruising, Other (Comment)(MASD, Rash.)                       Personal Care Assistance Level of Assistance              Functional Limitations Info  Sight, Hearing, Speech Sight Info: Adequate Hearing Info: Adequate Speech Info: Adequate    SPECIAL CARE FACTORS FREQUENCY  PT (By licensed PT)     PT Frequency: 5 x week              Contractures Contractures Info: Not present    Additional Factors Info  Code Status, Allergies, Isolation Precautions Code Status Info: Full  code Allergies Info: NKDA     Isolation Precautions Info: Enteric precautions     Current Medications (03/24/2019):  This is the current hospital active medication list Current Facility-Administered Medications  Medication Dose Route Frequency Provider Last Rate Last Dose  . acetaminophen (TYLENOL) tablet 650 mg  650 mg Oral Q6H PRN 05/24/2019, MD       Or  . acetaminophen (TYLENOL) suppository 650 mg  650 mg Rectal Q6H PRN Oralia Manis, MD      . enoxaparin (LOVENOX) injection 40 mg  40 mg Subcutaneous Q24H Oralia Manis, MD      . gabapentin (NEURONTIN) capsule 100 mg  100 mg Oral TID Auburn Bilberry, MD   100 mg at 03/24/19 0930  . [START ON 03/25/2019] lisinopril (ZESTRIL) tablet 5 mg  5 mg Oral Daily 05/25/2019, MD      . ondansetron [facility]) tablet 4 mg  4 mg Oral Q6H PRN [facility], MD       Or  . ondansetron [facility]) injection 4 mg  4 mg Intravenous Q6H PRN [facility], MD   4 mg at 03/23/19 0024  . oxyCODONE-acetaminophen (PERCOCET/ROXICET) 5-325 MG per tablet 1 tablet  1 tablet Oral Q6H PRN 05/23/19, MD      . tamsulosin [place]) capsule 0.4 mg  0.4 mg Oral QPC supper [facility], MD   0.4 mg at 03/23/19 1730     Discharge  Medications: Please see discharge summary for a list of discharge medications.  Relevant Imaging Results:  Relevant Lab Results:   Additional Information SS#: [id number]  Candie Chroman, LCSW

## 2019-03-25 LAB — GI PATHOGEN PANEL BY PCR, STOOL

## 2019-03-25 LAB — BASIC METABOLIC PANEL
Anion gap: 6 (ref 5–15)
BUN: 24 mg/dL — ABNORMAL HIGH (ref 8–23)
CO2: 24 mmol/L (ref 22–32)
Calcium: 8.5 mg/dL — ABNORMAL LOW (ref 8.9–10.3)
Chloride: 107 mmol/L (ref 98–111)
Creatinine, Ser: 1.21 mg/dL (ref 0.61–1.24)
GFR calc Af Amer: >60 mL/min (ref >60)
GFR calc non Af Amer: 55 mL/min — ABNORMAL LOW (ref >60)
Glucose, Bld: 91 mg/dL (ref 70–99)
Potassium: 4.2 mmol/L (ref 3.5–5.1)
Sodium: 137 mmol/L (ref 135–145)

## 2019-03-25 LAB — CULTURE, BLOOD (ROUTINE X 2): Culture: NO GROWTH

## 2019-03-25 LAB — CBC
HCT: 28.5 % — ABNORMAL LOW (ref 39.0–52.0)
Hemoglobin: 9.4 g/dL — ABNORMAL LOW (ref 13.0–17.0)
MCH: 32.3 pg (ref 26.0–34.0)
MCHC: 33 g/dL (ref 30.0–36.0)
MCV: 97.9 fL (ref 80.0–100.0)
Platelets: 151 10*3/uL (ref 150–400)
RBC: 2.91 MIL/uL — ABNORMAL LOW (ref 4.22–5.81)
RDW: 14.6 % (ref 11.5–15.5)
WBC: 8.2 10*3/uL (ref 4.0–10.5)
nRBC: 0 % (ref 0.0–0.2)

## 2019-03-25 LAB — SARS CORONAVIRUS 2 BY RT PCR (HOSPITAL ORDER, PERFORMED IN ~~LOC~~ HOSPITAL LAB): SARS Coronavirus 2: NEGATIVE

## 2019-03-25 MED ORDER — TRAMADOL HCL 50 MG PO TABS: 50.0000 mg | ORAL_TABLET | Freq: Two times a day (BID) | ORAL | 0 refills | Status: DC

## 2019-03-25 NOTE — Progress Notes (Signed)
Pt for discharge via em[facility]  [facility]. Report called  And [facility]  Discharge packet in pts packet. Pt in discharge pack

## 2019-03-25 NOTE — Discharge Summary (Addendum)
[facility] [place] [facility]  Jerome Craig Holloran Jr., 84 y.o., DOB 19-Dec-1934, MRN [id number]. Admission date: 03/20/2019 Discharge Date 03/25/2019 Primary MD Jerome [name], Marcus, MD Admitting Physician Oralia Manisavid Willis, MD  Admission Diagnosis  Enterocolitis [K52.9] AKI (acute kidney injury) (HCC) [N17.9] Severe sepsis (HCC) [A41.9, R65.20]  Discharge Diagnosis   Principal Problem: Severe sepsis due to colitis Acute kidney injury superimposed on chronic kidney disease Chronic systolic CHF Coronary artery disease Essential hypertension Anemia with borderline B12 levels Severe osteoarthritis of the hip knee Generalized weakness and deconditioning Recurrent syncope/loss of consciousness  Hospital Course  Jerome [name]  is a 84 y.o. male who presents with chief complaint as above.  Patient presents to the ED with a complaint of syncopal event.  He also states that he has had abdominal discomfort .  Initial admission H&P says he had significant diarrhea patient states that he never had diarrhea until he got to the hospital.  He kept insisting that he passed out 4 times and we were unable to figure out why.  His son was also stating that they are not sure why he is having these episodes.  During this admission patient's blood pressure was into the 40s on initial presentation in the ER that explains his loss of consciousness/syncope.  Patient also was seen by cardiology there was no arrhythmias noted during hospitalization.  He did not have any orthostatic changes.  He had a recent echo which did not show any significant abnormalities to explain current presentation.  Patient also had carotid Dopplers which did not show significant stenosis.  I explained to the patient that on presentation he was very dehydrated and he had acute kidney injury suggesting dehydration.  Patient is very weak and deconditioned need to go to rehab.   Palliative care team to follow at the  facility         Consults  cardiology  Significant Tests:  See full reports for all details     Ct Abdomen Pelvis Wo Contrast  Result Date: 03/20/2019 CLINICAL DATA:  Abdominal distension, nausea, vomiting EXAM: CT ABDOMEN AND PELVIS WITHOUT CONTRAST TECHNIQUE: Multidetector CT imaging of the abdomen and pelvis was performed following the standard protocol without IV contrast. COMPARISON:  Same-day radiograph of the chest FINDINGS: Lower chest: Few tree-in-bud opacities are present in the periphery of the left lung base. Atherosclerotic calcification of the coronary arteries. Normal heart size. No pericardial effusion. Hepatobiliary: Punctate calcification in the right lobe liver. No concerning hepatic lesions. Gallbladder largely decompressed. No frank wall thickening or visible calcified gallstones. No biliary ductal dilatation. Pancreas: Unremarkable. No pancreatic ductal dilatation or surrounding inflammatory changes. Spleen: Normal in size without focal abnormality. Adrenals/Urinary Tract: Normal adrenal glands. Few nonobstructing calcifications are present in the lower poles of both kidney. No obstructive urolithiasis or hydronephrosis. Several partially exophytic fluid attenuation cysts are seen in both kidneys, largest on the right measures approximately 4 cm, largest on the left measures approximately 3.7 cm no concerning renal lesions. Mild mural thickening of the bladder is slightly greater than expected for the degree of distention. Stomach/Bowel: Distal esophagus stomach and duodenum are unremarkable. Multiple loops of fluid-filled small bowel. Much of the colon is fluid-filled as well with some pericolonic hazy stranding most pronounced in the distal sigmoid. Of the segment does appear to be under active peristalsis. The appendix is surgically absent. Vascular/Lymphatic: Atherosclerotic plaque within the normal caliber aorta. No suspicious or enlarged lymph nodes in the included  lymphatic chains. Reproductive: Coarse  eccentric calcification of the borderline enlarged prostate. No concerning abnormalities of the prostate or seminal vesicles. Other: No abdominopelvic free fluid or free gas. No bowel containing hernias. Bilateral fat containing inguinal hernias are noted. Musculoskeletal: Multilevel degenerative changes are present in the imaged portions of the spine. Features are most severe at the L5 level grade 1 anterolisthesis of L4 on L5 noted as well. Mild wedging of several contiguous lower thoracic vertebrae appears to be on a chronic, likely degenerative basis. IMPRESSION: 1. Fluid-filled small and large bowel with some hazy pericolonic stranding. Correlate with clinical symptoms of enterocolitis 2. Mild mural thickening of the bladder is slightly greater than expected for the degree of distention. Could reflect a mild cystitis or chronic bladder outlet obstruction due to a borderline enlarged prostate. 3. Few tree-in-bud opacities in the periphery of the left lung base, suggestive of infectious or inflammatory bronchiolitis. 4. Aortic Atherosclerosis (ICD10-I70.0). Electronically Signed   By: Kreg Shropshire M.D.   On: 03/20/2019 19:20   Ct Head Wo Contrast  Result Date: 03/20/2019 CLINICAL DATA:  Altered level of consciousness, unexplained EXAM: CT HEAD WITHOUT CONTRAST TECHNIQUE: Contiguous axial images were obtained from the base of the skull through the vertex without intravenous contrast. COMPARISON:  None. 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 Multiple benign-appearing dural calcifications. No evidence of acute infarction, hemorrhage, hydrocephalus, extra-axial collection or mass lesion/mass effect. Symmetric prominence of the ventricles, cisterns and sulci compatible with parenchymal volume loss. Patchy areas of white matter hypoattenuation are most compatible with chronic microvascular angiopathy. Vascular: Atherosclerotic calcification of the carotid  siphons and intradural vertebral arteries. No hyperdense vessel. Skull: No calvarial fracture or suspicious osseous lesion. No scalp swelling or hematoma. Sinuses/Orbits: Paranasal sinuses and mastoid air cells are predominantly clear. Included orbital structures are unremarkable. Other: Edentulous with mandibular prognathism best appreciated on scout view IMPRESSION: No acute intracranial abnormality. Electronically Signed   By: Kreg Shropshire M.D.   On: 03/20/2019 19:22   US Carotid Bilateral  Result Date: 03/25/2019 CLINICAL DATA:  84 year old male with syncope and collapse EXAM: BILATERAL CAROTID DUPLEX ULTRASOUND TECHNIQUE: Wallace Cullens scale imaging, color Doppler and duplex ultrasound were performed of bilateral carotid and vertebral arteries in the neck. COMPARISON:  None. FINDINGS: Criteria: Quantification of carotid stenosis is based on velocity parameters that correlate the residual internal carotid diameter with NASCET-based stenosis levels, using the diameter of the distal internal carotid lumen as the denominator for stenosis measurement. The following velocity measurements were obtained: RIGHT ICA: 101/24 cm/sec CCA: 113/27 cm/sec SYSTOLIC ICA/CCA RATIO:  0.9 ECA:  93 cm/sec LEFT ICA: 68/7 cm/sec CCA: 123/17 cm/sec SYSTOLIC ICA/CCA RATIO:  0.6 ECA:  68 cm/sec RIGHT CAROTID ARTERY: No significant atherosclerotic plaque or evidence of stenosis in the internal carotid artery. RIGHT VERTEBRAL ARTERY:  Patent with normal antegrade flow. LEFT CAROTID ARTERY: No significant atherosclerotic plaque or evidence of stenosis in the internal carotid artery. LEFT VERTEBRAL ARTERY:  Patent with normal antegrade flow. IMPRESSION: 1. No significant atherosclerotic plaque or evidence of stenosis in either internal carotid artery. 2. Vertebral arteries are patent with normal antegrade flow. Signed, Sterling Big, MD, RPVI [facility] Electronically Signed   By:  Malachy Moan M.D.   On: 03/25/2019 08:47   Dg Chest Port 1 View  Result Date: 03/20/2019 CLINICAL DATA:  PT from home via EMS with c/o vasal vagal response while on toilet today with + LOC. Pt was unresponsive upon arrival per EMS with BP  in the 60s. Pt was given fluids and became more verbal. PT is alert and responding to pain. PT arrives in emesis and stool. BP 67/47 EXAM: PORTABLE CHEST 1 VIEW COMPARISON:  12/08/2018 FINDINGS: Cardiac silhouette is normal in size. No mediastinal or hilar masses. No evidence of adenopathy. Lungs are clear.  No pleural effusion or pneumothorax. Skeletal structures are grossly intact. IMPRESSION: No active disease. Electronically Signed   By: Amie Portland M.D.   On: 03/20/2019 18:58   Dg Abd 2 Views  Result Date: 03/23/2019 CLINICAL DATA:  84 year old male with history of sepsis from colitis, now presenting with nausea. EXAM: ABDOMEN - 2 VIEW COMPARISON:  No priors. FINDINGS: Gas in stool is noted throughout the colon extending to the level of the distal rectum. No pathologic dilatation of small bowel or colon. No pneumoperitoneum. Advanced degenerative changes in the right hip joint, potentially from longstanding a vascular necrosis with associated severe osteoarthritis. Moderate to severe left hip joint osteoarthritis also noted. IMPRESSION: 1. Nonobstructive bowel gas pattern. 2. No pneumoperitoneum. 3. Degenerative changes in the hip joints bilaterally (right greater than left), as above. Electronically Signed   By: Trudie Reed M.D.   On: 03/23/2019 15:20       Today   Subjective:   Jerome [name] no further diarrhea no abdominal pain Objective:   Blood pressure 139/69, pulse (!) 55, temperature 98.7 F (37.1 C), temperature source Oral, resp. rate 20, height 5\' 9"  (1.753 m), weight 104.3 kg, SpO2 99 %.  .  Intake/Output Summary (Last 24 hours) at 03/25/2019 1054 Last data filed at 03/24/2019 2309 Gross per 24 hour  Intake -  Output 740 ml   Net -740 ml    Exam VITAL SIGNS: Blood pressure 139/69, pulse (!) 55, temperature 98.7 F (37.1 C), temperature source Oral, resp. rate 20, height 5\' 9"  (1.753 m), weight 104.3 kg, SpO2 99 %.  GENERAL:  84 y.o.-year-old patient lying in the bed with no acute distress.  EYES: Pupils equal, round, reactive to light and accommodation. No scleral icterus. Extraocular muscles intact.  HEENT: Head atraumatic, normocephalic. Oropharynx and nasopharynx clear.  NECK:  Supple, no jugular venous distention. No thyroid enlargement, no tenderness.  LUNGS: Normal breath sounds bilaterally, no wheezing, rales,rhonchi or crepitation. No use of accessory muscles of respiration.  CARDIOVASCULAR: S1, S2 normal. No murmurs, rubs, or gallops.  ABDOMEN: Soft, nontender, nondistended. Bowel sounds present. No organomegaly or mass.  EXTREMITIES: No pedal edema, cyanosis, or clubbing.  NEUROLOGIC: Cranial nerves II through XII are intact. Muscle strength 5/5 in all extremities. Sensation intact. Gait not checked.  PSYCHIATRIC: The patient is alert and oriented x 3.  SKIN: No obvious rash, lesion, or ulcer.   Data Review     CBC w Diff:  Lab Results  Component Value Date   WBC 8.2 03/25/2019   HGB 9.4 (L) 03/25/2019   HGB 10.9 (L) 03/06/2012   HCT 28.5 (L) 03/25/2019   HCT 37.4 (L) 02/20/2012   PLT 151 03/25/2019   PLT 107 (L) 03/06/2012   LYMPHOPCT 14 03/23/2019   MONOPCT 8 03/23/2019   EOSPCT 2 03/23/2019   BASOPCT 0 03/23/2019   CMP:  Lab Results  Component Value Date   NA 137 03/25/2019   NA 138 03/06/2012   K 4.2 03/25/2019   K 4.3 03/06/2012   CL 107 03/25/2019   CL 103 03/06/2012   CO2 24 03/25/2019   CO2 29 03/06/2012   BUN 24 (H) 03/25/2019   BUN  16 03/06/2012   CREATININE 1.21 03/25/2019   CREATININE 1.29 03/06/2012   PROT 6.9 03/20/2019   ALBUMIN 2.2 (L) 03/23/2019   BILITOT 0.9 03/20/2019   ALKPHOS 54 03/20/2019   AST 37 03/20/2019   ALT 26 03/20/2019  .  Micro  Results Recent Results (from the past 240 hour(s))  Blood Culture (routine x 2)     Status: None   Collection Time: 03/20/19  5:20 PM   Specimen: BLOOD  Result Value Ref Range Status   Specimen Description BLOOD RIGHT ANTECUBITAL  Final   Special Requests   Final    BOTTLES DRAWN AEROBIC AND ANAEROBIC Blood Culture results may not be optimal due to an excessive volume of blood received in culture bottles   Culture   Final    NO GROWTH 5 DAYS Performed at [facility], [address], [place], [place] [address]    Report Status 03/25/2019 FINAL  Final  C difficile quick scan w PCR reflex     Status: None   Collection Time: 03/20/19  8:03 PM   Specimen: STOOL  Result Value Ref Range Status   C Diff antigen NEGATIVE NEGATIVE Final   C Diff toxin NEGATIVE NEGATIVE Final   C Diff interpretation No C. difficile detected.  Final    Comment: Performed at [facility], [address] [place], [place] [address]  SARS Coronavirus 2 [facility] order, Performed in [facility] hospital lab) Nasopharyngeal Nasopharyngeal Swab     Status: None   Collection Time: 03/20/19  9:09 PM   Specimen: Nasopharyngeal Swab  Result Value Ref Range Status   SARS Coronavirus 2 NEGATIVE NEGATIVE Final    Comment: (NOTE) If result is NEGATIVE SARS-CoV-2 target nucleic acids are NOT DETECTED. The SARS-CoV-2 RNA is generally detectable in upper and lower  respiratory specimens during the acute phase of infection. The lowest  concentration of SARS-CoV-2 viral copies this assay can detect is 250  copies / mL. A negative result does not preclude SARS-CoV-2 infection  and should not be used as the sole basis for treatment or other  patient management decisions.  A negative result may occur with  improper specimen collection / handling, submission of specimen other  than nasopharyngeal swab, presence of viral mutation(s) within the  areas targeted by this assay, and inadequate number of viral  copies  (<250 copies / mL). A negative result must be combined with clinical  observations, patient history, and epidemiological information. If result is POSITIVE SARS-CoV-2 target nucleic acids are DETECTED. The SARS-CoV-2 RNA is generally detectable in upper and lower  respiratory specimens dur ing the acute phase of infection.  Positive  results are indicative of active infection with SARS-CoV-2.  Clinical  correlation with patient history and other diagnostic information is  necessary to determine patient infection status.  Positive results do  not rule out bacterial infection or co-infection with other viruses. If result is PRESUMPTIVE POSTIVE SARS-CoV-2 nucleic acids MAY BE PRESENT.   A presumptive positive result was obtained on the submitted specimen  and confirmed on repeat testing.  While 2019 novel coronavirus  (SARS-CoV-2) nucleic acids may be present in the submitted sample  additional confirmatory testing may be necessary for epidemiological  and / or clinical management purposes  to differentiate between  SARS-CoV-2 and other Sarbecovirus currently known to infect humans.  If clinically indicated additional testing with an alternate test  methodology [phone]) is advised. The SARS-CoV-2 RNA is generally  detectable in upper and lower  respiratory sp ecimens during the acute  phase of infection. The expected result is Negative. Fact Sheet for Patients:  BoilerBrush.com.cy Fact Sheet for Healthcare Providers: https://pope.com/ This test is not yet approved or cleared by the [place] FDA and has been authorized for detection and/or diagnosis of SARS-CoV-2 by FDA under an Emergency Use Authorization (EUA).  This EUA will remain in effect (meaning this test can be used) for the duration of the COVID-19 declaration under Section 564(b)(1) of the Act, 21 U.S.C. section 360bbb-3(b)(1), unless the authorization is terminated  or revoked sooner. Performed at [facility], [address] [place], [place] [address]   Blood Culture (routine x 2)     Status: None (Preliminary result)   Collection Time: 03/21/19  8:56 AM   Specimen: BLOOD  Result Value Ref Range Status   Specimen Description BLOOD LEFT ANTECUBITAL  Final   Special Requests   Final    BOTTLES DRAWN AEROBIC AND ANAEROBIC Blood Culture adequate volume   Culture   Final    NO GROWTH 4 DAYS Performed at [facility], [address], [place], [place] [address]    Report Status PENDING  Incomplete  Urine culture     Status: None   Collection Time: 03/21/19 12:17 PM   Specimen: In/Out Cath Urine  Result Value Ref Range Status   Specimen Description   Final    IN/OUT CATH URINE Performed at [facility], [address], [place], [place] [address]    Special Requests   Final    NONE Performed at [facility], [address], [place], [place] [address]    Culture   Final    NO GROWTH Performed at [facility], [address] [place], [place] [address]    Report Status 03/22/2019 FINAL  Final        Code Status Orders  (From admission, onward)         Start     Ordered   03/21/19 0327  Full code  Continuous     03/21/19 0326        Code Status History    Date Active Date Inactive Code Status Order ID Comments User Context   12/09/2018 0153 12/12/2018 2051 Full Code [id number]  Pearletha Alfred, NP ED   Advance Care Planning Activity            Discharge Medications   Allergies as of 03/25/2019   No Known Allergies     Medication List    STOP taking these medications   furosemide 40 MG tablet Commonly known as: LASIX   gabapentin 100 MG capsule Commonly known as: NEURONTIN   lisinopril 10 MG tablet Commonly known as: ZESTRIL   potassium chloride SA 20 MEQ tablet Commonly known as: KLOR-CON     TAKE these medications   carvedilol 6.25 MG tablet Commonly known as:  COREG Take 1 tablet (6.25 mg total) by mouth 2 (two) times daily with a meal.   multivitamin with minerals Tabs tablet Take 1 tablet by mouth daily.   tamsulosin 0.4 MG Caps capsule Commonly known as: FLOMAX Take 1 capsule by mouth daily. 30 minutes after last meal every evening   traMADol 50 MG tablet Commonly known as: ULTRAM Take 1 tablet (50 mg total) by mouth 2 (two) times daily.          Total Time in preparing paper work, data evaluation and todays exam - 35 minutes  Lexie Koehl Carolanne Grumbling.D  on 03/25/2019 at 10:54 AM [facility]   Office  [phone]

## 2019-03-25 NOTE — Discharge Instructions (Signed)
Acute Kidney Injury, Adult ° °Acute kidney injury is a sudden worsening of kidney function. The kidneys are organs that have several jobs. They filter the blood to remove waste products and extra fluid. They also maintain a healthy balance of minerals and hormones in the body, which helps control blood pressure and keep bones strong. With this condition, your kidneys do not do their jobs as well as they should. °This condition ranges from mild to severe. Over time it may develop into long-lasting (chronic) kidney disease. Early detection and treatment may prevent acute kidney injury from developing into a chronic condition. °What are the causes? °Common causes of this condition include: °· A problem with blood flow to the kidneys. This may be caused by: °? Low blood pressure (hypotension) or shock. °? Blood loss. °? Heart and blood vessel (cardiovascular) disease. °? Severe burns. °? Liver disease. °· Direct damage to the kidneys. This may be caused by: °? Certain medicines. °? A kidney infection. °? Poisoning. °? Being around or in contact with toxic substances. °? A surgical wound. °? A hard, direct hit to the kidney area. °· A sudden blockage of urine flow. This may be caused by: °? Cancer. °? Kidney stones. °? An enlarged prostate in males. °What are the signs or symptoms? °Symptoms of this condition may not be obvious until the condition becomes severe. Symptoms of this condition can include: °· Tiredness (lethargy), or difficulty staying awake. °· Nausea or vomiting. °· Swelling (edema) of the face, legs, ankles, or feet. °· Problems with urination, such as: °? Abdominal pain, or pain along the side of your stomach (flank). °? Decreased urine production. °? Decrease in the force of urine flow. °· Muscle twitches and cramps, especially in the legs. °· Confusion or trouble concentrating. °· Loss of appetite. °· Fever. °How is this diagnosed? °This condition may be diagnosed with tests, including: °· Blood  tests. °· Urine tests. °· Imaging tests. °· A test in which a sample of tissue is removed from the kidneys to be examined under a microscope (kidney biopsy). °How is this treated? °Treatment for this condition depends on the cause and how severe the condition is. In mild cases, treatment may not be needed. The kidneys may heal on their own. In more severe cases, treatment will involve: °· Treating the cause of the kidney injury. This may involve changing any medicines you are taking or adjusting your dosage. °· Fluids. You may need specialized IV fluids to balance your body's needs. °· Having a catheter placed to drain urine and prevent blockages. °· Preventing problems from occurring. This may mean avoiding certain medicines or procedures that can cause further injury to the kidneys. °In some cases treatment may also require: °· A procedure to remove toxic wastes from the body (dialysis or continuous renal replacement therapy - CRRT). °· Surgery. This may be done to repair a torn kidney, or to remove the blockage from the urinary system. °Follow these instructions at home: °Medicines °· Take over-the-counter and prescription medicines only as told by your health care provider. °· Do not take any new medicines without your health care provider's approval. Many medicines can worsen your kidney damage. °· Do not take any vitamin and mineral supplements without your health care provider's approval. Many nutritional supplements can worsen your kidney damage. °Lifestyle °· If your health care provider prescribed changes to your diet, follow them. You may need to decrease the amount of protein you eat. °· Achieve and maintain a healthy   weight. If you need help with this, ask your health care provider. °· Start or continue an exercise plan. Try to exercise at least 30 minutes a day, 5 days a week. °· Do not use any tobacco products, such as cigarettes, chewing tobacco, and e-cigarettes. If you need help quitting, ask your  health care provider. °General instructions °· Keep track of your blood pressure. Report changes in your blood pressure as told by your health care provider. °· Stay up to date with immunizations. Ask your health care provider which immunizations you need. °· Keep all follow-up visits as told by your health care provider. This is important. °Where to find more information °· American Association of Kidney Patients: www.aakp.org °· National Kidney Foundation: www.kidney.org °· American Kidney Fund: www.akfinc.org °· Life Options Rehabilitation Program: °? www.lifeoptions.org °? www.kidneyschool.org °Contact a health care provider if: °· Your symptoms get worse. °· You develop new symptoms. °Get help right away if: °· You develop symptoms of worsening kidney disease, which include: °? Headaches. °? Abnormally dark or light skin. °? Easy bruising. °? Frequent hiccups. °? Chest pain. °? Shortness of breath. °? End of menstruation in women. °? Seizures. °? Confusion or altered mental status. °? Abdominal or back pain. °? Itchiness. °· You have a fever. °· Your body is producing less urine. °· You have pain or bleeding when you urinate. °Summary °· Acute kidney injury is a sudden worsening of kidney function. °· Acute kidney injury can be caused by problems with blood flow to the kidneys, direct damage to the kidneys, and sudden blockage of urine flow. °· Symptoms of this condition may not be obvious until it becomes severe. Symptoms may include edema, lethargy, confusion, nausea or vomiting, and problems passing urine. °· This condition can usually be diagnosed with blood tests, urine tests, and imaging tests. Sometimes a kidney biopsy is done to diagnose this condition. °· Treatment for this condition often involves treating the underlying cause. It is treated with fluids, medicines, dialysis, diet changes, or surgery. °This information is not intended to replace advice given to you by your health care provider. Make  sure you discuss any questions you have with your health care provider. °Document Released: 12/19/2010 Document Revised: 05/18/2017 Document Reviewed: 05/26/2016 °Elsevier Patient Education © 2020 Elsevier Inc. ° °

## 2019-03-25 NOTE — TOC Transition Note (Signed)
Transition of Care [facility]) - CM/SW Discharge Note   Patient Details  Name: Jerome [name]. MRN: [id number] Date of Birth: [birth year]  [facility]) CM/SW Contact:  Shelbie Hutching, RN Phone Number: 03/25/2019, 11:41 AM   Clinical Narrative:    Patient is medically cleared for discharge and will discharge to [facility] today.  Son Shanon Brow has been updated on plan of care.  Patient will go to room 801, bedside RN to call report to [phone].  [facility] is aware of [facility].  [facility] reported that they would be able to follow patient for palliative services at [facility].     Final next level of care: Skilled Nursing Facility Barriers to Discharge: Barriers Resolved   Patient Goals and CMS Choice   CMS Medicare.gov Compare Post Acute Care list provided to:: Patient Choice offered to / list presented to : Patient  Discharge Placement              Patient chooses bed at: [facility] [place] Patient to be transferred to facility by: [facility] Name of family member notified: Ronney Lion Patient and family notified of of transfer: 03/25/19  Discharge Plan and Services     Post Acute Care Choice: [facility]                               Social Determinants of Health (SDOH) Interventions     Readmission Risk Interventions Readmission Risk Prevention Plan 12/11/2018  Post Dischage Appt Complete  Medication Screening Complete  Transportation Screening Complete  Some recent data might be hidden

## 2019-03-26 LAB — CULTURE, BLOOD (ROUTINE X 2)
Culture: NO GROWTH
Special Requests: ADEQUATE

## 2019-09-05 ENCOUNTER — Telehealth: Payer: Self-pay | Admitting: Primary Care

## 2019-09-05 NOTE — Telephone Encounter (Signed)
Called patient/son to schedule Palliative Consult, no answer - left message with reason for call along with my name and contact number

## 2019-09-05 NOTE — Telephone Encounter (Signed)
Rec'd call back from patient and spoke with both patient  & son Onalee Hua regarding Palliative services, all questions were answered and consent rec'd from patient to begin Palliative services.  I have scheduled an In-person Consult for 09/08/19 @ 3 PM

## 2019-09-08 ENCOUNTER — Other Ambulatory Visit: Payer: Self-pay

## 2019-09-08 ENCOUNTER — Encounter: Payer: Self-pay | Admitting: Primary Care

## 2019-09-08 ENCOUNTER — Other Ambulatory Visit: Payer: Medicare Other | Admitting: Primary Care

## 2019-09-08 DIAGNOSIS — Z515 Encounter for palliative care: Secondary | ICD-10-CM

## 2019-09-08 NOTE — Progress Notes (Signed)
.    [facility] Consult Note Telephone: [phone]  Fax: [phone]    PATIENT NAME: Jerome [name]. [place] [address] [phone] (home)  DOB: Apr 01, 1935 MRN: [id number]  PRIMARY CARE PROVIDER:   Derinda Late, MD, [place]. [facility] [place] [address] [phone]  REFERRING PROVIDER:  Derinda Late, MD [phone] [facility] [facility] [place],  [place] [address] [phone]  RESPONSIBLE PARTY:   Extended Emergency Contact Information Primary Emergency Contact: Krystal, Delduca Address: [address]          [place], [place] [address] Johnnette Litter of [place] Phone: [phone] Relation: Son   I met with Mr. Mickelson and his son Shanon Brow in their home. They voice their frustration at care giving needs not currently met. The patient had been on hospice for a while and then was discharged as stable. He has a fair amount of mobility disability but not a clear six-month prognosis at this point to my assessment.  ASSESSMENT AND RECOMMENDATIONS:   1. Advance Care Planning/Goals of Care: Goals include to maximize quality of life and symptom management. We discussed advance directives. He has done these recently in the form of a living will. I spent some time attempting to translate these [place] choices into the most form of vernacular. Ultimately he did request full scope of treatment and said he wanted anything that might keep him alive. We will continue to discuss the concept of goals of care. Currently he does seek pain control and assistance with ADLs.   2. Symptom Management:   Pain: We discussed pain control it's not well-managed and it does impact his mobility. Currently he has two hydrocodone with acetaminophen prescribed Q 12 hours PRN by his PCP which he takes twice daily. I have instructed them to add around the  clock acetaminophen arthritis (CR) 650 mg;  this should keep him to 2600 mg/ day. They endorse that he takes some Aleve and I educated that this was not a first choice given his cardiac and renal status. Has shooting pain in legs and failed gabapentin twice with severe hypotension per son's report.  Recommend SSRI or SNRI immediately. Recommend acetaminophen 650 mg q 8 hrs for pain management.    Caregiving Strain:  His son Shanon Brow did state that they were not interested in placement for long-term care. We discussed his finances and payment which are limited for hiring caregivers. He has a home which they are trying to clean out to rent currently.I discussed some of the available resources in [place] they are already established with: Museum/gallery curator, alreaady receiving meals on wheels, food coming through a school connection for his grandson. I asked him to look into the PACE program which seems to be a good fit for their needs. Shanon Brow didn't remember if they had pursued this in the past but he did not think they would be able to offer them anything. I feel like it would be a good fit potentially.  Rehab:  Please order home health  to [facility]. He would benefit from OT, PT and home health aide while taking he is therapy. We discussed Home Health:  he is in need of some rehabilitative physical therapy. He ceased therapy at skilled facilities earlier last fall. He is becoming more and more debilitated at home. I recommend a home health referral and the  patient needs  PT for eval and treat and HEP, and OT for assistance with adls. They need a home health aide for personal care. Has walker and w/c. Has hospital. Needs larger BSC. Has hospital  Bed and trapeze but no lift. I also recommend a lift chair.  Urology: Complains of continued frequency/hesitancy but is taking 0.4 mg tamsulosin. I recommend urology f/u to assess PVR and possible retention. Son also states he has complained of some  burning but there are no other s/sx UTI: Afebrile, not ill appearing, no additional incontinence or behavioral disturbances.  Mobility:  Explained that exercise is also medicine. States he will try to walk more but does not seem convinced. He has stopped walking in the house from 2 to 1 x  / day. I asked him to consider going back to 2 for the next month until we meet again. He emphatically requests physical therapy.   3. Family /Caregiver/Community Supports: Lives with son and grandson and boarder in son's home. Has MOW, [facility] elder[facility] services.   4. Cognitive / Functional decline: A and O x 3, periods of forgetfulness. Son states hoading behavior and that he cannot find papers due to this tendency. Needs help with all ialds and many alds.   5. Follow up Palliative Care Visit: Palliative care will continue to follow for goals of care clarification and symptom management. Return 4-6 weeks or prn.  I spent 75 minutes providing this consultation,  from 1530 to 1645. More than 50% of the time in this consultation was spent coordinating communication.   HISTORY OF PRESENT ILLNESS:  Jerome [name]. is a 85 y.o. year old male with multiple medical problems including OA, CHF, chronic pain, immobility, urinary hesitancy, CAD.  Palliative Care was asked to follow this patient by consultation request of Derinda Late, MD to help address advance care planning and goals of care. This is the initial  visit.  CODE STATUS: FULL CODE  PPS: 30% HOSPICE ELIGIBILITY/DIAGNOSIS: TBD  PAST MEDICAL HISTORY:  Past Medical History:  Diagnosis Date  . CAD (coronary artery disease)   . Chronic systolic CHF (congestive heart failure) ([place])   . Hypertension     SOCIAL HX:  Social History   Tobacco Use  . Smoking status: Never Smoker  . Smokeless tobacco: Never Used  Substance Use Topics  . Alcohol use: Never    ALLERGIES:  Allergies  Allergen Reactions  . Gabapentin Other (See Comments)     hypotension     PERTINENT MEDICATIONS:  Outpatient Encounter Medications as of 09/08/2019  Medication Sig  . acetaminophen (TYLENOL) 650 MG CR tablet Take 650 mg by mouth every 8 (eight) hours.  Marland Kitchen amLODipine (NORVASC) 5 MG tablet Take 5 mg by mouth daily.  . Ascorbic Acid (VITAMIN C WITH ROSE HIPS) 500 MG tablet Take 500 mg by mouth in the morning and at bedtime.  . carvedilol (COREG) 6.25 MG tablet Take 1 tablet (6.25 mg total) by mouth 2 (two) times daily with a meal.  . HYDROcodone-acetaminophen (NORCO/VICODIN) 5-325 MG tablet Take 1 tablet by mouth every 12 (twelve) hours as needed for moderate pain.  . Multiple Vitamins-Minerals (MULTIVITAMIN WITH MINERALS) tablet Take 1 tablet by mouth daily.  . tamsulosin (FLOMAX) 0.4 MG CAPS capsule Take 1 capsule by mouth daily. 30 minutes after last meal every evening  . vitamin B-12 (CYANOCOBALAMIN) 1000 MCG tablet Take 1,000 mcg by mouth daily.  . [DISCONTINUED] Multiple Vitamin (MULTIVITAMIN WITH MINERALS) TABS tablet Take 1 tablet by mouth daily.  . [  DISCONTINUED] traMADol (ULTRAM) 50 MG tablet Take 1 tablet (50 mg total) by mouth 2 (two) times daily.   No facility-administered encounter medications on file as of 09/08/2019.     PHYSICAL EXAM / ROS:   Current and past weights: 215 lbs reported General: NAD, frail appearing, WNWD Cardiovascular: no chest pain reported, slight LE  edema Pulmonary: no cough, no increased SOB at rest, + DOE, room air Abdomen: appetite fair, denies constipation, incontinent of bowel at times GU: endorses dysuria, incontinent of urine at times MSK:  Numerous arthritic  joint deformities, ambulatory with walker Skin: no rashes or wounds reported, h/o incontinence related skin damage Neurological: Weakness,  insomnia  Jason Coop, NP [facility]  COVID-19 PATIENT SCREENING TOOL  Person answering questions: ____________David_______ _____   1.  Is the patient or any family member in the home showing any  signs or symptoms regarding respiratory infection?               Person with Symptom- __________NA_________________  a. Fever                                                                          Yes___ No___          ___________________  b. Shortness of breath                                                    Yes___ No___          ___________________ c. Cough/congestion                                       Yes___  No___         ___________________ d. Body aches/pains                                                         Yes___ No___        ____________________ e. Gastrointestinal symptoms (diarrhea, nausea)           Yes___ No___        ____________________  2. Within the past 14 days, has anyone living in the home had any contact with someone with or under investigation for COVID-19?    Yes___ No_X_   Person __________________

## 2019-10-09 ENCOUNTER — Other Ambulatory Visit: Payer: Self-pay

## 2019-10-09 ENCOUNTER — Other Ambulatory Visit: Payer: Medicare Other | Admitting: Primary Care

## 2019-10-09 DIAGNOSIS — Z515 Encounter for palliative care: Secondary | ICD-10-CM

## 2019-10-09 NOTE — Progress Notes (Signed)
[facility] Consult Note Telephone: [phone]  Fax: [phone]    PATIENT NAME: Jerome [name]. Jerome [name] [address] [phone] (home)  DOB: [birth year] MRN: [id number]  PRIMARY CARE PROVIDER:   Derinda Late, MD, [place]. [facility] [place] [address] [phone]  REFERRING PROVIDER:  Derinda Late, MD [phone] [address] [facility] [facility] [place],  [place] [address] [phone]  RESPONSIBLE PARTY:   Extended Emergency Contact Information Primary Emergency Contact: Dez, Stauffer Address: [address]          [place],  [address] Johnnette Litter of [place] Phone: [phone] Relation: Son   I met with patient and family in the home.  ASSESSMENT AND RECOMMENDATIONS:   1. Advance Care Planning/Goals of Care: Goals include to maximize quality of life and symptom management. MOST was completed and patient gave permission to upload to [facility].FULL scope of care.  2. Symptom Management:   Patient was d/c from hospice several months ago for stability. Now has chronic pain and immobility needing management.  Pain: Poorly controlled. Referred to Ortho. Please initiate an SSRI or SNRI for addressing chronic pain and depression.  Caregiver Needs:  Discussed home management. Has [facility]  for now with PT, OT RN and HHA. These will come thru mid May. I discussed with OT and Shanon Brow, pt son,the need to transition to a care plan once home health has left. I had left pACE number for [facility] but he did not call. He endorses father's pain and immobility as great problem. He states they were waiting for PCP to send referral. However pt is known to [facility] and so I called and obtained a f/u appt for him.  I also spoke with  Nathaneil Canary at  [facility] who is intake coordinator. She will give pt a call and let them know  about their services.  Cost would be around $350 for his spend down but would include total care management and in home ADL assistance. He is paying private hire currently.I recommend a comprehensive care program such as PACE due to many needs across many disciplines.  I made and appointment with Leim Fabry MD is on May 11 at 9:45 am in the [facility] office. That address is  [address], [place],  [address]. He will call and set  up [place] services, and I gave pt the address to give to [facility] transportation.  I have also put him on the list with our care coordination and [facility]. He had not initiated services when I visited last.   3. Family /Caregiver/Community Supports: Lives with son in his home. Has a home which needs to be cleaned out to rent. Has [facility] now, working on other care provision for in home care coordination, hopefully via PACE.  4. Cognitive / Functional decline: A and O but forgetful/overwhelmed. Chronic and debilitation pain. Getting pT and OT for increasing fx but states he waits on caregivers to get OOB in am and to eat.  5. Follow up Palliative Care Visit: Palliative care will continue to follow for goals of care clarification and symptom management. Return 4 weeks or prn.  I spent 60 minutes providing this consultation,  from 1415 to 1515. More than 50% of the time in this consultation was spent coordinating communication.   HISTORY OF PRESENT ILLNESS:  Jerome Marlett. is a 85 y.o. year  old male with multiple medical problems including chronic pain, immobility, cad, . Palliative Care was asked to follow this patient by consultation request of Derinda Late, MD to help address advance care planning and goals of care. This is a follow up visit.  CODE STATUS:  FULL  PPS: 30% HOSPICE ELIGIBILITY/DIAGNOSIS: TBD  PAST MEDICAL HISTORY:  Past Medical History:  Diagnosis Date  . CAD (coronary artery disease)   . Chronic systolic CHF (congestive heart failure) ([place])   .  Hypertension     SOCIAL HX:  Social History   Tobacco Use  . Smoking status: Never Smoker  . Smokeless tobacco: Never Used  Substance Use Topics  . Alcohol use: Never    ALLERGIES:  Allergies  Allergen Reactions  . Gabapentin Other (See Comments)    hypotension     PERTINENT MEDICATIONS:  Outpatient Encounter Medications as of 10/09/2019  Medication Sig  . acetaminophen (TYLENOL) 650 MG CR tablet Take 650 mg by mouth every 8 (eight) hours.  Marland Kitchen amLODipine (NORVASC) 5 MG tablet Take 5 mg by mouth daily.  . Ascorbic Acid (VITAMIN C WITH ROSE HIPS) 500 MG tablet Take 500 mg by mouth in the morning and at bedtime.  . carvedilol (COREG) 6.25 MG tablet Take 1 tablet (6.25 mg total) by mouth 2 (two) times daily with a meal.  . HYDROcodone-acetaminophen (NORCO/VICODIN) 5-325 MG tablet Take 1 tablet by mouth every 12 (twelve) hours as needed for moderate pain.  . Multiple Vitamins-Minerals (MULTIVITAMIN WITH MINERALS) tablet Take 1 tablet by mouth daily.  . tamsulosin (FLOMAX) 0.4 MG CAPS capsule Take 0.4 mg by mouth daily.  . vitamin B-12 (CYANOCOBALAMIN) 1000 MCG tablet Take 1,000 mcg by mouth daily.   No facility-administered encounter medications on file as of 10/09/2019.    PHYSICAL EXAM / ROS:   Current and past weights: unavailable General: NAD, frail appearing, WNWD Cardiovascular: no chest pain reported, sl LE edema,  Pulmonary: no cough, no increased SOB, room air Abdomen: appetite fair, incontinent of bowel at times GU: denies dysuria, incontinent of urine  MSK: OA widespread with limited ROM,  non ambulatory, needs w/c for mobility, chronic pain. Skin: no rashes or wounds reported Neurological: Weakness, chronic pain,   Jason Coop, NP [facility]  COVID-19 PATIENT SCREENING TOOL  Person answering questions: __________David ______ _____   1.  Is the patient or any family member in the home showing any signs or symptoms regarding respiratory infection?                Person with Symptom- __________NA_________________  a. Fever                                                                          Yes___ No___          ___________________  b. Shortness of breath                                                    Yes___ No___          ___________________ c. Cough/congestion  Yes___  No___         ___________________ d. Body aches/pains                                                         Yes___ No___        ____________________ e. Gastrointestinal symptoms (diarrhea, nausea)           Yes___ No___        ____________________  2. Within the past 14 days, has anyone living in the home had any contact with someone with or under investigation for COVID-19?    Yes___ No_X_   Person __________________   

## 2019-10-29 ENCOUNTER — Telehealth: Payer: Self-pay | Admitting: Primary Care

## 2019-10-29 NOTE — Telephone Encounter (Signed)
T/c from Geanie Cooley from [facility] options for caregiving for Mr. Schnake. She has also informed them about PACE program. They were still considering options. I will reinforce available options if more resources are needed. I will see patient on 5/27 and we can discuss any new developments as needed after that visit.

## 2019-10-30 ENCOUNTER — Other Ambulatory Visit: Payer: Medicare Other | Admitting: Primary Care

## 2019-11-12 ENCOUNTER — Telehealth: Payer: Self-pay | Admitting: Primary Care

## 2019-11-12 NOTE — Telephone Encounter (Signed)
T/c to patient to confirm appt tomorrow. He wants to hold off on PC and get his leg looked at for resolution of chronic pain. He needs cardiology referral for assessment before surgery. I will reach out in several months to offer [facility] services again.

## 2019-11-13 ENCOUNTER — Other Ambulatory Visit: Payer: Medicare Other | Admitting: Primary Care

## 2019-11-18 ENCOUNTER — Telehealth: Payer: Self-pay

## 2019-11-18 NOTE — Telephone Encounter (Signed)
Spoke with patient who wanted to ask about being cleared for surgery in regards to cardiac status. Patient would like to know if he is cleared or not before scheduled visit with orthopedic surgery on 12/18/19.  Reached out to Dr. Pilar Plate office regarding patient's concerns.  Palliative NP updated

## 2019-11-18 NOTE — Telephone Encounter (Signed)
Received message to call patient for clarification on message left for NP.VM left for patient.

## 2019-12-17 ENCOUNTER — Other Ambulatory Visit: Payer: Self-pay | Admitting: Rehabilitative and Restorative Service Providers"

## 2019-12-17 DIAGNOSIS — I2510 Atherosclerotic heart disease of native coronary artery without angina pectoris: Secondary | ICD-10-CM

## 2019-12-17 DIAGNOSIS — Z01818 Encounter for other preprocedural examination: Secondary | ICD-10-CM

## 2019-12-17 DIAGNOSIS — I10 Essential (primary) hypertension: Secondary | ICD-10-CM

## 2019-12-17 DIAGNOSIS — R0602 Shortness of breath: Secondary | ICD-10-CM

## 2019-12-17 DIAGNOSIS — I5022 Chronic systolic (congestive) heart failure: Secondary | ICD-10-CM

## 2019-12-23 ENCOUNTER — Other Ambulatory Visit

## 2019-12-25 ENCOUNTER — Other Ambulatory Visit: Payer: Self-pay

## 2019-12-25 ENCOUNTER — Encounter
Admission: RE | Admit: 2019-12-25 | Discharge: 2019-12-25 | Disposition: A | Discharge status: Home / Self Care | Payer: Medicare Other | Source: Ambulatory Visit | Attending: Rehabilitative and Restorative Service Providers" | Admitting: Rehabilitative and Restorative Service Providers"

## 2019-12-25 DIAGNOSIS — I2510 Atherosclerotic heart disease of native coronary artery without angina pectoris: Secondary | ICD-10-CM | POA: Diagnosis present

## 2019-12-25 DIAGNOSIS — I5022 Chronic systolic (congestive) heart failure: Secondary | ICD-10-CM | POA: Diagnosis present

## 2019-12-25 DIAGNOSIS — Z01818 Encounter for other preprocedural examination: Secondary | ICD-10-CM | POA: Diagnosis present

## 2019-12-25 DIAGNOSIS — I10 Essential (primary) hypertension: Secondary | ICD-10-CM

## 2019-12-25 DIAGNOSIS — R0602 Shortness of breath: Secondary | ICD-10-CM | POA: Diagnosis present

## 2019-12-25 MED ORDER — TECHNETIUM TC 99M TETROFOSMIN IV KIT
10.0000 | PACK | Freq: Once | INTRAVENOUS | Status: AC | PRN
  Administered 2019-12-25: 10.24 via INTRAVENOUS

## 2019-12-25 MED ORDER — TECHNETIUM TC 99M TETROFOSMIN IV KIT
29.9800 | PACK | Freq: Once | INTRAVENOUS | Status: AC | PRN
  Administered 2019-12-25: 29.98 via INTRAVENOUS

## 2019-12-25 MED ORDER — REGADENOSON 0.4 MG/5ML IV SOLN
0.4000 mg | Freq: Once | INTRAVENOUS | Status: AC
  Administered 2019-12-25: 0.4 mg via INTRAVENOUS

## 2019-12-26 LAB — NM MYOCAR MULTI W/SPECT W/WALL MOTION / EF
Estimated workload: 1 METS
Exercise duration (min): 1 min
Exercise duration (sec): 13 s
LV dias vol: 100 mL (ref 62–150)
LV sys vol: 39 mL
Peak HR: 76 {beats}/min
Percent HR: 55 %
Rest HR: 56 {beats}/min
SDS: 0
SRS: 6
SSS: 4
TID: 0.91

## 2020-01-15 ENCOUNTER — Other Ambulatory Visit: Payer: Medicare Other

## 2020-01-15 NOTE — Discharge Instructions (Signed)
Instructions after Total Hip Replacement     Jerome [name], Jr., M.D.     Dept. of Orthopaedics & Sports Medicine  [facility]  [address]  [place], [place]  [address]  Phone: [phone]   Fax: [phone]    DIET: . Drink plenty of non-alcoholic fluids. . Resume your normal diet. Include foods high in fiber.  ACTIVITY:  . You may use crutches or a walker with weight-bearing as tolerated, unless instructed otherwise. . You may be weaned off of the walker or crutches by your Physical Therapist.  . Do NOT reach below the level of your knees or cross your legs until allowed.    . Continue doing gentle exercises. Exercising will reduce the pain and swelling, increase motion, and prevent muscle weakness.   . Please continue to use the TED compression stockings for 6 weeks. You may remove the stockings at night, but should reapply them in the morning. . Do not drive or operate any equipment until instructed.  WOUND CARE:  . Continue to use ice packs periodically to reduce pain and swelling. . Keep the incision clean and dry. . You may bathe or shower after the staples are removed at the first office visit following surgery.  MEDICATIONS: . You may resume your regular medications. . Please take the pain medication as prescribed on the medication. . Do not take pain medication on an empty stomach. . You have been given a prescription for a blood thinner to prevent blood clots. Please take the medication as instructed. (NOTE: After completing a 2 week course of Lovenox, take one Enteric-coated aspirin once a day.) . Pain medications and iron supplements can cause constipation. Use a stool softener (Senokot or Colace) on a daily basis and a laxative (dulcolax or miralax) as needed. . Do not drive or drink alcoholic beverages when taking pain medications.  CALL THE OFFICE FOR: . Temperature above 101 degrees . Excessive bleeding or drainage on the dressing. . Excessive  swelling, coldness, or paleness of the toes. . Persistent nausea and vomiting.  FOLLOW-UP:  . You should have an appointment to return to the office in 6 weeks after surgery. . Arrangements have been made for continuation of Physical Therapy (either home therapy or outpatient therapy).     [facility] Department Directory         [email or web address]       [email or web address]          Cardiology  Appointments: [place] - [phone] [place] - [phone]  Endocrinology  Appointments: [place] - [phone] [place] - [phone]  Gastroenterology  Appointments: [place] - [phone] [place] - [phone]        General Surgery   Appointments: [place] - [phone]  Internal Medicine/Family Medicine  Appointments: [place] - [phone] [place] - [phone] [place] - [phone]  Metabolic and Weigh Loss Surgery  Appointments: [place] - [phone]        Neurology  Appointments: [place] - [phone] [place] - [phone]  Neurosurgery  Appointments: [place] - [phone]  Obstetrics & Gynecology  Appointments: [place] - [phone] [place] - [phone]        Pediatrics  Appointments: [place] - [phone] [place] - [phone]  Physiatry  Appointments: [place] [phone]  Physical Therapy  Appointments: [place] - [phone] [place] - [phone]        Podiatry  Appointments: [place] - [phone] [place] - [phone]  Pulmonology  Appointments: [place] - [phone]  Rheumatology  Appointments: [place] - [phone]        [place] Location: [facility]  [address] [place], [place]  [address]  [place] Location: [facility] [address] [place], [place]  [address]  [place] Location: [facility] [address] [place], [place]  [address]    

## 2020-01-16 ENCOUNTER — Other Ambulatory Visit
Admission: RE | Admit: 2020-01-16 | Discharge: 2020-01-16 | Disposition: A | Discharge status: Home / Self Care | Payer: Medicare Other | Source: Ambulatory Visit | Attending: Orthopedic Surgery | Admitting: Orthopedic Surgery

## 2020-01-16 ENCOUNTER — Inpatient Hospital Stay
Admission: RE | Admit: 2020-01-16 | Discharge: 2020-01-16 | Disposition: A | Discharge status: Home / Self Care | Payer: Medicare Other | Source: Ambulatory Visit

## 2020-01-16 ENCOUNTER — Other Ambulatory Visit: Payer: Self-pay

## 2020-01-16 DIAGNOSIS — Z20822 Contact with and (suspected) exposure to covid-19: Secondary | ICD-10-CM | POA: Insufficient documentation

## 2020-01-16 DIAGNOSIS — Z01812 Encounter for preprocedural laboratory examination: Secondary | ICD-10-CM | POA: Diagnosis present

## 2020-01-16 LAB — SARS CORONAVIRUS 2 (TAT 6-24 HRS): SARS Coronavirus 2: NEGATIVE

## 2020-01-16 NOTE — Pre-Procedure Instructions (Signed)
Patient will come in for in-person PAT visit on 8/2 at 14:30

## 2020-01-16 NOTE — Pre-Procedure Instructions (Signed)
Spoke to Jerome [name], Mr[facility]eg[facility] nurse to explain that he came for a Covid test today, instead of his in- person PAT visit.  He will have to return on Mon. 8/2 for his PAT visit and labs. Once she talks to Mr. Handel and determines a convenient time for the appointment on Monday 8/2 we can schedule it. He understands he must remain quarantined until his surgery since he got Covid tested "early".

## 2020-01-19 ENCOUNTER — Other Ambulatory Visit: Payer: Medicare Other

## 2020-01-19 ENCOUNTER — Encounter
Admission: RE | Admit: 2020-01-19 | Discharge: 2020-01-19 | Disposition: A | Discharge status: Home / Self Care | Payer: Medicare Other | Source: Ambulatory Visit | Attending: Orthopedic Surgery | Admitting: Orthopedic Surgery

## 2020-01-19 ENCOUNTER — Other Ambulatory Visit: Payer: Self-pay

## 2020-01-19 DIAGNOSIS — Z01818 Encounter for other preprocedural examination: Secondary | ICD-10-CM | POA: Insufficient documentation

## 2020-01-19 HISTORY — DX: Anxiety disorder, unspecified: F41.9

## 2020-01-19 HISTORY — DX: Unspecified osteoarthritis, unspecified site: M19.90

## 2020-01-19 HISTORY — DX: Chronic kidney disease, unspecified: N18.9

## 2020-01-19 HISTORY — DX: Depression, unspecified: F32.A

## 2020-01-19 LAB — URINALYSIS, ROUTINE W REFLEX MICROSCOPIC
Bilirubin Urine: NEGATIVE
Glucose, UA: NEGATIVE mg/dL
Hgb urine dipstick: NEGATIVE
Ketones, ur: NEGATIVE mg/dL
Nitrite: POSITIVE — AB
Protein, ur: NEGATIVE mg/dL
Specific Gravity, Urine: 1.018 (ref 1.005–1.030)
WBC, UA: >50 WBC/hpf — ABNORMAL HIGH (ref 0–5)
pH: 5 (ref 5.0–8.0)

## 2020-01-19 LAB — PROTIME-INR
INR: 1 (ref 0.8–1.2)
Prothrombin Time: 12.9 seconds (ref 11.4–15.2)

## 2020-01-19 LAB — SURGICAL PCR SCREEN
MRSA, PCR: NEGATIVE
Staphylococcus aureus: NEGATIVE

## 2020-01-19 LAB — APTT: aPTT: 29 seconds (ref 24–36)

## 2020-01-19 LAB — TYPE AND SCREEN
ABO/RH(D): A NEG
Antibody Screen: NEGATIVE

## 2020-01-19 LAB — SEDIMENTATION RATE: Sed Rate: 37 mm/hr — ABNORMAL HIGH (ref 0–20)

## 2020-01-19 LAB — C-REACTIVE PROTEIN: CRP: 0.7 mg/dL (ref <1.0)

## 2020-01-19 NOTE — Patient Instructions (Signed)
Your procedure is scheduled on: Wed. 8/4 Report to Day Surgery. To find out your arrival time please call [phone] between 1PM - 3PM on Tues 8/3.  Remember: Instructions that are not followed completely may result in serious medical risk,  up to and including death, or upon the discretion of your surgeon and anesthesiologist your  surgery may need to be rescheduled.     _X__ 1. Do not eat food after midnight the night before your procedure.                 No gum chewing or hard candies. You may drink clear liquids up to 2 hours                 before you are scheduled to arrive for your surgery- DO not drink clear                 liquids within 2 hours of the start of your surgery.                 Clear Liquids include:  water, apple juice without pulp, clear Gatorade, G2 or                  Gatorade Zero (avoid Red/Purple/Blue), Black Coffee or Tea (Do not add                 anything to coffee or tea). __x___2.   Complete the carbohydrate drink provided to you, 2 hours before arrival.  __X__2.  On the morning of surgery brush your teeth with toothpaste and water, you                may rinse your mouth with mouthwash if you wish.  Do not swallow any toothpaste of mouthwash.     ___ 3.  No Alcohol for 24 hours before or after surgery.   ___ 4.  Do Not Smoke or use e-cigarettes For 24 Hours Prior to Your Surgery.                 Do not use any chewable tobacco products for at least 6 hours prior to                 Surgery.  ___  5.  Do not use any recreational drugs (marijuana, cocaine, heroin, ecstacy, MDMA or other)                For at least one week prior to your surgery.  Combination of these drugs with anesthesia                May have life threatening results.  ____  6.  Bring all medications with you on the day of surgery if instructed.   __x__  7.  Notify your doctor if there is any change in your medical condition      (cold, fever,  infections).     Do not wear jewelry, make-up, hairpins, clips or nail polish. Do not wear lotions, powders, or perfumes. You may wear deodorant. Do not shave 48 hours prior to surgery. Men may shave face and neck. Do not bring valuables to the hospital.    [facility] is not responsible for any belongings or valuables.  Contacts, dentures or bridgework may not be worn into surgery. Leave your suitcase in the car. After surgery it may be brought to your room. For patients admitted to the hospital, discharge time is determined by your treatment  team.   Patients discharged the day of surgery will not be allowed to drive home.   Make arrangements for someone to be with you for the first 24 hours of your Same Day Discharge.    Please read over the following fact sheets that you were given:    _x___ Take these medicines the morning of surgery with A SIP OF WATER:    1. amLODipine (NORVASC) 5 MG tablet  2. carvedilol (COREG) 6.25 MG tablet  3. HYDROcodone-acetaminophen (NORCO/VICODIN) 5-325 MG tablet if needed  4.  5.  6.  ____ Fleet Enema (as directed)   _x___ Use CHG Soap (or wipes) as directed  ____ Use Benzoyl Peroxide Gel as instructed  ____ Use inhalers on the day of surgery  ____ Stop metformin 2 days prior to surgery    ____ Take 1/2 of usual insulin dose the night before surgery. No insulin the morning          of surgery.   ____ Stop Coumadin/Plavix/aspirin on   ____ Stop Anti-inflammatories    __x__ Stop supplements until after surgery.  Ascorbic Acid (VITAMIN C WITH ROSE HIPS) 500 MG tablet, Multiple Vitamins-Minerals (MULTIVITAMIN WITH MINERALS) tablet  ____ Bring C-Pap to the hospital.

## 2020-01-19 NOTE — Progress Notes (Signed)
  [facility] Perioperative Services: Pre-Admission/Anesthesia Testing  Abnormal Lab Notification    Date: 01/19/20  Name: Jerome [name]. MRN:   [id number]  Re: Abnormal labs noted during PAT appointment   Provider(s) Notified: Hooten, Illene Labrador, MD and Lasandra Beech, PA-C Notification mode: Routed and/or faxed via [facility]   ABNORMAL LAB VALUE(S): Lab Results  Component Value Date   COLORURINE YELLOW (A) 01/19/2020   APPEARANCEUR HAZY (A) 01/19/2020   LABSPEC 1.018 01/19/2020   PHURINE 5.0 01/19/2020   GLUCOSEU NEGATIVE 01/19/2020   HGBUR NEGATIVE 01/19/2020   BILIRUBINUR NEGATIVE 01/19/2020   KETONESUR NEGATIVE 01/19/2020   PROTEINUR NEGATIVE 01/19/2020   NITRITE POSITIVE (A) 01/19/2020   LEUKOCYTESUR LARGE (A) 01/19/2020   EPIU 0-5 01/19/2020   WBCU >50 (H) 01/19/2020   RBCU 0-5 01/19/2020   BACTERIA MANY (A) 01/19/2020   Notes: Urine culture collected; results pending. Patient scheduled for a RIGHT total hip arthroplasty on 01/21/2020. Will send to surgeon and his APP for review and treatment. This is a Personal assistant; no formal response is required.  Quentin Mulling, MSN, APRN, FNP-C, CEN [facility]  Peri-operative Services Nurse Practitioner Phone: [phone] 01/19/20 4:27 PM

## 2020-01-20 ENCOUNTER — Encounter: Payer: Self-pay | Admitting: Orthopedic Surgery

## 2020-01-20 NOTE — Progress Notes (Signed)
[facility] Perioperative Services  Pre-Admission/Anesthesia Testing Clinical Review  Date: 01/20/20  Patient Demographics:  Name: Jerome [name]. DOB:   01-12-1935 MRN:   [id number]  Planned Surgical Procedure(s):    Case: [id number] Date/Time: 01/21/20 0815   Procedure: TOTAL HIP ARTHROPLASTY (Right Hip)   Anesthesia type: Choice   Pre-op diagnosis: Primary osteoarthritis of right hip M16.11   Location: [facility] OR ROOM 01 / [facility] ORS FOR ANESTHESIA GROUP   Surgeons: Donato Heinz, MD     NOTE: Available PAT nursing documentation and vital signs have been reviewed. Clinical nursing staff has updated patient's PMH/PSHx, current medication list, and drug allergies/intolerances to ensure comprehensive history available to assist in medical decision making as it pertains to the aforementioned surgical procedure and anticipated anesthetic course.   Clinical Discussion:  Jerome [name]. is a 85 y.o. male who is submitted for pre-surgical anesthesia review and clearance prior to him undergoing the above procedure. Patient has never been a smoker. Pertinent PMH includes: CAD, CHF, HTN, CKD-III, OA, anxiety, depression.  Patient established care with local cardiology practice [facility] - Lady Gary, MD). He was seen in the cardiology clinic on 12/16/2019; notes reviewed.  At that time, patient denied angina/anginal equivalent symptoms.  Patient denies any peripheral edema, orthopnea, PND, or syncopal/presyncopal episodes.  He complained of moderate exertional dyspnea, which was chronic. Severe CAD detected on CT imaging of the chest when admitted and June 2020; patient never underwent ischemic work-up per his report. Last echocardiogram in 2020 reviewed revealing an LVEF of 45-50% with left ventricular hypokinesis.  Patient underwent myocardial perfusion study on 12/25/2019 that was normal; LVEF 55-65% (see full testing results below).  ECG revealed sinus bradycardia  with first-degree AV block.  Patient pending elective orthopedic surgery and cardiac clearance has been requested.  Per cardiology, patient may proceed with proposed surgical/anesthetic course with a MODERATE risk stratification.This patient is not on daily anticoagulation therapy.  He denies previous intra-operative complications with anesthesia.   Vitals with BMI 01/19/2020 03/25/2019 03/25/2019  Height 5\' 8"  - -  Weight 216 lbs - -  BMI 32.85 - -  Systolic 136 140  Diastolic 7 73 69  Pulse 60 64 55    Providers/Specialists:   NOTE: Primary physician provider listed below. Patient may have been seen by APP or partner within same practice.   PROVIDER ROLE LAST 824, MD Orthopedics (Surgeon) 01/16/2020  01/18/2020, MD Primary Care Provider 01/06/2020  01/08/2020, MD Cardiology 12/16/2019   Allergies:  Gabapentin  Current Home Medications:   . amLODipine (NORVASC) 5 MG tablet  . Ascorbic Acid (VITAMIN C WITH ROSE HIPS) 500 MG tablet  . carvedilol (COREG) 6.25 MG tablet  . HYDROcodone-acetaminophen (NORCO/VICODIN) 5-325 MG tablet  . Multiple Vitamins-Minerals (MULTIVITAMIN WITH MINERALS) tablet  . tamsulosin (FLOMAX) 0.4 MG CAPS capsule  . vitamin B-12 (CYANOCOBALAMIN) 1000 MCG tablet   No current facility-administered medications for this encounter.   History:   Past Medical History:  Diagnosis Date  . Anxiety   . Arthritis   . CAD (coronary artery disease)   . Chronic kidney disease    stage 3  . Chronic systolic CHF (congestive heart failure) (HCC)   . Depression   . Hypertension    Past Surgical History:  Procedure Laterality Date  . APPENDECTOMY    . HERNIA REPAIR Right    inguinal  . JOINT REPLACEMENT Right    knee   History reviewed.  No pertinent family history. Social History   Tobacco Use  . Smoking status: Never Smoker  . Smokeless tobacco: Never Used  Vaping Use  . Vaping Use: Never used  Substance Use Topics  . Alcohol  use: Never  . Drug use: Never    Pertinent Clinical Results:  LABS: Labs reviewed: Acceptable for surgery.    UA abnormal and consistent with infection.  Culture and sensitivity pending.  Results forwarded to orthopedic surgeon and his APP for review and treatment prior to upcoming elective orthopedic procedure.  Hospital Outpatient Visit on 01/19/2020  Component Date Value Ref Range Status  . CRP 01/19/2020 0.7  <1.0 mg/dL Final   Performed at [facility], [address], [place], [place] [address]  . Sed Rate 01/19/2020 37* 0 - 20 mm/hr Final   Performed at [facility], [address] [place] [place], [place] [address]  . MRSA, PCR 01/19/2020 NEGATIVE  NEGATIVE Final  . Staphylococcus aureus 01/19/2020 NEGATIVE  NEGATIVE Final   Comment: (NOTE) The Xpert SA Assay (FDA approved for NASAL specimens in patients 52 years of age and older), is one component of a comprehensive surveillance program. It is not intended to diagnose infection nor to guide or monitor treatment. Performed at [facility], [address], [place], [place] [address]   . aPTT 01/19/2020 29  24 - 36 seconds Final   Performed at [facility], [address] [place] [place], [place] [address]  . Prothrombin Time 01/19/2020 12.9  11.4 - 15.2 seconds Final  . INR 01/19/2020 1.0  0.8 - 1.2 Final   Comment: (NOTE) INR goal varies based on device and disease states. Performed at [facility], [address] [place], [place] [address]   . ABO/RH(D) 01/19/2020 A NEG   Final  . Antibody Screen 01/19/2020 NEG   Final  . Sample Expiration 01/19/2020 02/02/2020,2359   Final  . Extend sample reason 01/19/2020    Final                   Value:NO TRANSFUSIONS OR PREGNANCY IN THE PAST 3 MONTHS Performed at [facility], [address] [place] [place], [place] [address]   . Color, Urine 01/19/2020 YELLOW* YELLOW Final  . APPearance 01/19/2020 HAZY* CLEAR Final  . Specific Gravity,  Urine 01/19/2020 1.018  1.005 - 1.030 Final  . pH 01/19/2020 5.0  5.0 - 8.0 Final  . Glucose, UA 01/19/2020 NEGATIVE  NEGATIVE mg/dL Final  . Hgb urine dipstick 01/19/2020 NEGATIVE  NEGATIVE Final  . Bilirubin Urine 01/19/2020 NEGATIVE  NEGATIVE Final  . Ketones, ur 01/19/2020 NEGATIVE  NEGATIVE mg/dL Final  . Protein, ur 63/87/5643 NEGATIVE  NEGATIVE mg/dL Final  . Nitrite 32/95/1884 POSITIVE* NEGATIVE Final  . Leukocytes,Ua 01/19/2020 LARGE* NEGATIVE Final  . RBC / HPF 01/19/2020 0-5  0 - 5 RBC/hpf Final  . WBC, UA 01/19/2020 >50* 0 - 5 WBC/hpf Final  . Bacteria, UA 01/19/2020 MANY* NONE SEEN Final  . Squamous Epithelial / LPF 01/19/2020 0-5  0 - 5 Final  . Mucus 01/19/2020 PRESENT   Final   Performed at [facility] [address] [place], [place] [address]    ECG: Date: 12/16/2019 Time ECG obtained: 1425 PM Rate: 57 bpm Rhythm: Sinus bradycardia with first-degree AV block Axis (leads I and aVF): Normal Intervals: PR 212 ms. QTc 404 ms. ST segment and T wave changes: No evidence of ST segment elevation or depression Comparison: Similar to previous tracing obtained  on 03/20/2019; mild PR interval prolongation now noted.   IMAGING / PROCEDURES: LEXISCAN done on 12/25/2019 1. LVEF 55-65% 2. Overall normal LV systolic function. 3. LV cavity size normal 4. Blood pressure demonstrated normal response to exercise. 5. There is no ST segment deviation noted during stress. 6. Normal low risk study.  ECHOCARDIOGRAM done on 12/09/2018 1. Left ventricle is mildly reduced systolic function with an EF of 45-50%.  Cavity size was normal.  Left-ventricular diastolic Doppler parameters are consistent with pseudonormalization.  Elevated mean left atrial pressure.  Left ventricular diffuse hypokinesis. 2. The right ventricle is mildly reduced systolic function.  The cavity was mildly enlarged.  There is no increase in right ventricular wall thickness.  Right ventricular systolic pressure  could not be assessed. 3. Left atrial size was mildly dilated. 4. Right atrial size is moderately dilated. 5. Aortic valve is tricuspid. 6. The aortic root is normal in size and structure. 7. There is mild dilatation of the ascending aorta measuring 44 mm. 8. The inferior vena cava was normal in size with <50% respiratory variability.  Impression and Plan:  Jerome [name]. has been referred for pre-anesthesia review and clearance prior him undergoing the planned anesthetic and procedural courses. Available labs, pertinent testing, and imaging results were personally reviewed by me. This patient has been appropriately cleared by cardiology.   Based on clinical review performed today (01/20/20), barring and significant acute changes in the patient's overall condition, it is anticipated that he will be able to proceed with the planned surgical intervention. Any acute changes in clinical condition may necessitate his procedure being postponed and/or cancelled. Pre-surgical instructions were reviewed with the patient during his PAT appointment and questions were fielded by PAT clinical staff.  Quentin Mulling, MSN, APRN, FNP-C, CEN [facility]  Peri-operative Services Nurse Practitioner Phone: [phone] 01/20/20 8:26 AM  NOTE: This note has been prepared using Dragon dictation software. Despite my best ability to proofread, there is always the potential that unintentional transcriptional errors may still occur from this process.

## 2020-01-20 NOTE — H&P (Signed)
ORTHOPAEDIC HISTORY & PHYSICAL  Progress Notes Michelene Gardener, PA - 01/16/2020 10:15 AM EDT [facility] Chief Complaint:   Chief Complaint  Patient presents with  . Hip Pain  H & P RIGHT HIP   History of Present Illness:   Jerome Littler. is a 85 y.o. male that presents to clinic today for his preoperative history and evaluation. Patient presents with his son. The patient is scheduled to undergo a right total hip arthroplasty on 01/21/20 by Dr. Ernest Pine. Patient reports a long history of right hip and groin pain. He describes his pain as worse with attempted weightbearing or range of motion of the hip. He denies associated numbness or tingling.   The patient's symptoms have progressed to the point that they decrease his quality of life. The patient has previously undergone conservative treatment including NSAIDS and activity modification without adequate control of his symptoms.  Patient has received cardiac clearance. Denies history of blood clots, history of lumbar surgery.  Past Medical, Surgical, Family, Social History, Allergies, Medications:   Past Medical History:  Past Medical History:  Diagnosis Date  . Carpal tunnel syndrome  . History of colon polyps  . Hypertension  . Mild mitral regurgitation  . Mild renal insufficiency  . Morbid obesity (CMS-HCC)  . Varicosities of leg   Past Surgical History:  Past Surgical History:  Procedure Laterality Date  . APPENDECTOMY  . Heart catheterization 2003  . HERNIA REPAIR  Inguinal hernia repair.  . Left carpal tunnel release 08/10/2010  . Righ total knee arthroplasty using computer-assisted navigation. 03/04/2012  Dr Ernest Pine   Current Medications:  Current Outpatient Medications  Medication Sig Dispense Refill  . amLODIPine (NORVASC) 5 MG tablet TAKE 1 TABLET BY MOUTH EVERY DAY 90 tablet 1  . ascorbic acid, vitamin C, (VITAMIN C) 500 MG tablet Take 500 mg by mouth 2  (two) times daily  . carvediloL (COREG) 6.25 MG tablet TAKE 1 TABLET BY MOUTH TWICE A DAY WITH FOOD 180 tablet 1  . cyanocobalamin (VITAMIN B12) 1000 MCG tablet Take 1,000 mcg by mouth once daily  . HYDROcodone-acetaminophen (NORCO) 5-325 mg tablet Take 1 tablet by mouth 2 (two) times daily as needed for Pain for up to 30 days 60 tablet 0  . multivitamin,tx-iron-minerals (THERA-M) Tab Take 1 tablet by mouth once daily  . PURELAX powder MIX 17 GRAMS IN 4-8 OUNCES OF FLUID & TAKE DAILY AS NEEDED FOR CONSTIPATION FOR UP TO 30 DAYS 850 g 0  . tamsulosin (FLOMAX) 0.4 mg capsule TAKE 1 CAPSULE (0.4 MG TOTAL) BY MOUTH ONCE DAILY TAKE 30 MINUTES AFTER SAME MEAL EACH DAY. 90 capsule 1   No current facility-administered medications for this visit.   Allergies:  Allergies  Allergen Reactions  . Gabapentin Other (See Comments)  hypotension   Social History:  Social History   Socioeconomic History  . Marital status: Divorced  Spouse name: Not on file  . Number of children: 1  . Years of education: 55  . Highest education level: Not on file  Occupational History  . Occupation: Retired  Tobacco Use  . Smoking status: Never Smoker  . Smokeless tobacco: Never Used  Vaping Use  . Vaping Use: Never used  Substance and Sexual Activity  . Alcohol use: No  Alcohol/week: 0.0 standard drinks  . Drug use: Not Currently  . Sexual activity: Not Currently  Other Topics Concern  . Not on file  Social History Narrative  .  Not on file   Social Determinants of Health   Financial Resource Strain:  . Difficulty of Paying Living Expenses:  Food Insecurity:  . Worried About Programme researcher, broadcasting/film/video in the Last Year:  . Barista in the Last Year:  Transportation Needs:  . Freight forwarder (Medical):  Marland Kitchen Lack of Transportation (Non-Medical):  Physical Activity:  . Days of Exercise per Week:  . Minutes of Exercise per Session:  Stress:  . Feeling of Stress :  Social Connections:  .  Frequency of Communication with Friends and Family:  . Frequency of Social Gatherings with Friends and Family:  . Attends Religious Services:  . Active Member of Clubs or Organizations:  . Attends Banker Meetings:  Marland Kitchen Marital Status:   Family History:  Family History  Problem Relation Age of Onset  . High blood pressure (Hypertension) Mother  . Coronary Artery Disease (Blocked arteries around heart) Mother  . Cancer Mother  Gum cancer  . Alcohol abuse Father  . Prostate cancer Brother  . Hyperlipidemia (Elevated cholesterol) Brother   Review of Systems:   A 10+ ROS was performed, reviewed, and the pertinent orthopaedic findings are documented in the HPI.   Physical Examination:   BP 128/80  Ht 172.7 cm (5\' 8" )  Wt 98.6 kg (217 lb 4.8 oz)  BMI 33.04 kg/m   Patient is a well-developed, well-nourished male in no acute distress. Patient has normal mood and affect. Patient is alert and oriented to person, place, and time.   HEENT: Atraumatic, normocephalic. Pupils equal and reactive to light. Extraocular motion intact. Noninjected sclera.  Cardiovascular: Regular rate and rhythm, with no murmurs, rubs, or gallops. Distal pulses palpable.  Respiratory: Lungs clear to auscultation bilaterally.   Right Hip: Limb lengths: There appears to be a relative limb length discrepancy, although difficult to assess due to the restricted motion of the right hip. Soft tissue swelling: Negative Erythema: Negative Crepitance: Positive Tenderness: Greater trochanter is nontender to palpation. Severe pain is elicited by axial compression or extremes of rotation. Atrophy: No atrophy. Fair to good hip flexor and abductor strength. Range of Motion: The hip is fixed and approximately 20 degrees of external rotation. Less than 90 degrees of flexion can be achieved secondary to guarding.  Significant flexion contracture of the right knee measuring approximately 2025 degrees is  noted.  Sensation is intact over the saphenous, lateral cutaneous, superficial fibular, and deep fibular nerve distributions.  Tests Performed/Reviewed:  X-rays  No new radiographs were obtained today. Previous radiographs were reviewed of the right hip and revealed complete loss of femoral acetabular joint space with bone-on-bone contact, subchondral sclerosis, and significant osteophyte formation noted. Superior erosion of the acetabulum is noted. Significant shortening of the right leg is noted. No fractures.  Impression:   ICD-10-CM  1. Primary osteoarthritis of right hip M16.11  2. Status post total right knee replacement Z96.651   Plan:   The patient has end-stage degenerative changes of the right hip. It was explained to the patient that the condition is progressive in nature. Having failed conservative treatment, the patient has elected to proceed with a total joint arthroplasty. The patient will undergo a total joint arthroplasty with Dr. . The risks of surgery, including blood clot and infection, were discussed with the patient. Measures to reduce these risks, including the use of anticoagulation, perioperative antibiotics, and early ambulation were discussed. The importance of postoperative physical therapy was discussed with the patient. The patient elects  to proceed with surgery. The patient is instructed to stop all blood thinners prior to surgery. The patient is instructed to call the hospital the day before surgery to learn of the proper arrival time.   Contact our office with any questions or concerns. Follow up as indicated, or sooner should any new problems arise, if conditions worsen, or if they are otherwise concerned.   Michelene Gardener, PA-C [facility] [address] [place], [place] [address] Phone: [phone]  This note was generated in part with voice recognition software and I apologize for any typographical  errors that were not detected and corrected.   Electronically signed by Michelene Gardener, PA at 01/16/2020 1:23 PM EDT

## 2020-01-21 ENCOUNTER — Encounter
Admission: RE | Disposition: A | Discharge status: Skilled Nursing Facility | Payer: Self-pay | Source: Home / Self Care | Attending: Orthopedic Surgery

## 2020-01-21 ENCOUNTER — Encounter: Payer: Self-pay | Admitting: Orthopedic Surgery

## 2020-01-21 ENCOUNTER — Other Ambulatory Visit: Payer: Self-pay

## 2020-01-21 ENCOUNTER — Inpatient Hospital Stay
Admission: RE | Admit: 2020-01-21 | Discharge: 2020-01-24 | DRG: 470 | Disposition: A | Discharge status: Skilled Nursing Facility | Payer: Medicare Other | Attending: Orthopedic Surgery | Admitting: Orthopedic Surgery

## 2020-01-21 ENCOUNTER — Inpatient Hospital Stay: Payer: Medicare Other | Admitting: Anesthesiology

## 2020-01-21 ENCOUNTER — Inpatient Hospital Stay: Payer: Medicare Other

## 2020-01-21 ENCOUNTER — Inpatient Hospital Stay: Payer: Medicare Other | Admitting: Urgent Care

## 2020-01-21 DIAGNOSIS — N183 Chronic kidney disease, stage 3 unspecified: Secondary | ICD-10-CM | POA: Diagnosis present

## 2020-01-21 DIAGNOSIS — I130 Hypertensive heart and chronic kidney disease with heart failure and stage 1 through stage 4 chronic kidney disease, or unspecified chronic kidney disease: Secondary | ICD-10-CM | POA: Diagnosis present

## 2020-01-21 DIAGNOSIS — I2510 Atherosclerotic heart disease of native coronary artery without angina pectoris: Secondary | ICD-10-CM | POA: Diagnosis present

## 2020-01-21 DIAGNOSIS — Z6833 Body mass index (BMI) 33.0-33.9, adult: Secondary | ICD-10-CM

## 2020-01-21 DIAGNOSIS — Z9049 Acquired absence of other specified parts of digestive tract: Secondary | ICD-10-CM | POA: Diagnosis not present

## 2020-01-21 DIAGNOSIS — Z79899 Other long term (current) drug therapy: Secondary | ICD-10-CM

## 2020-01-21 DIAGNOSIS — I5022 Chronic systolic (congestive) heart failure: Secondary | ICD-10-CM | POA: Diagnosis present

## 2020-01-21 DIAGNOSIS — Z96649 Presence of unspecified artificial hip joint: Secondary | ICD-10-CM

## 2020-01-21 DIAGNOSIS — Z20822 Contact with and (suspected) exposure to covid-19: Secondary | ICD-10-CM | POA: Diagnosis present

## 2020-01-21 DIAGNOSIS — Z96641 Presence of right artificial hip joint: Secondary | ICD-10-CM

## 2020-01-21 DIAGNOSIS — Z96651 Presence of right artificial knee joint: Secondary | ICD-10-CM | POA: Diagnosis present

## 2020-01-21 DIAGNOSIS — M1611 Unilateral primary osteoarthritis, right hip: Secondary | ICD-10-CM | POA: Diagnosis present

## 2020-01-21 HISTORY — PX: TOTAL HIP ARTHROPLASTY: SHX124

## 2020-01-21 LAB — URINE CULTURE: Special Requests: NORMAL

## 2020-01-21 SURGERY — ARTHROPLASTY, HIP, TOTAL,POSTERIOR APPROACH
Anesthesia: Spinal | Site: Hip | Laterality: Right

## 2020-01-21 MED ORDER — PROPOFOL 500 MG/50ML IV EMUL
INTRAVENOUS | Status: DC | PRN
  Administered 2020-01-21: 60 ug/kg/min via INTRAVENOUS

## 2020-01-21 MED ORDER — NEOMYCIN-POLYMYXIN B GU 40-200000 IR SOLN
Status: DC | PRN
  Administered 2020-01-21: 14 mL

## 2020-01-21 MED ORDER — DEXAMETHASONE SODIUM PHOSPHATE 10 MG/ML IJ SOLN: 8.0000 mg | Freq: Once | INTRAMUSCULAR | Status: AC

## 2020-01-21 MED ORDER — METOCLOPRAMIDE HCL 10 MG PO TABS
10.0000 mg | ORAL_TABLET | Freq: Three times a day (TID) | ORAL | Status: AC
  Administered 2020-01-21 – 2020-01-23 (×7): 10 mg via ORAL
  Filled 2020-01-21 (×8): qty 1

## 2020-01-21 MED ORDER — FENTANYL CITRATE (PF) 100 MCG/2ML IJ SOLN
INTRAMUSCULAR | Status: AC
  Administered 2020-01-21: 25 ug via INTRAVENOUS
  Filled 2020-01-21: qty 2

## 2020-01-21 MED ORDER — TAMSULOSIN HCL 0.4 MG PO CAPS
0.4000 mg | ORAL_CAPSULE | Freq: Every day | ORAL | Status: DC
  Administered 2020-01-21 – 2020-01-22 (×2): 0.4 mg via ORAL
  Filled 2020-01-21 (×3): qty 1

## 2020-01-21 MED ORDER — AMLODIPINE BESYLATE 5 MG PO TABS
5.0000 mg | ORAL_TABLET | Freq: Every day | ORAL | Status: DC
  Administered 2020-01-22 – 2020-01-24 (×3): 5 mg via ORAL
  Filled 2020-01-21 (×3): qty 1

## 2020-01-21 MED ORDER — TRANEXAMIC ACID-NACL 1000-0.7 MG/100ML-% IV SOLN: 1000.0000 mg | Freq: Once | INTRAVENOUS | Status: AC

## 2020-01-21 MED ORDER — PROPOFOL 500 MG/50ML IV EMUL
INTRAVENOUS | Status: AC
  Filled 2020-01-21: qty 50

## 2020-01-21 MED ORDER — SENNOSIDES-DOCUSATE SODIUM 8.6-50 MG PO TABS
1.0000 | ORAL_TABLET | Freq: Two times a day (BID) | ORAL | Status: DC
  Administered 2020-01-21 – 2020-01-23 (×7): 1 via ORAL
  Filled 2020-01-21 (×6): qty 1

## 2020-01-21 MED ORDER — ACETAMINOPHEN 325 MG PO TABS: 325.0000 mg | ORAL_TABLET | Freq: Four times a day (QID) | ORAL | Status: DC | PRN

## 2020-01-21 MED ORDER — DIPHENHYDRAMINE HCL 12.5 MG/5ML PO ELIX: 12.5000 mg | ORAL_SOLUTION | ORAL | Status: DC | PRN

## 2020-01-21 MED ORDER — CARVEDILOL 3.125 MG PO TABS
6.2500 mg | ORAL_TABLET | Freq: Two times a day (BID) | ORAL | Status: DC
  Administered 2020-01-23 – 2020-01-24 (×2): 6.25 mg via ORAL
  Filled 2020-01-21 (×5): qty 2

## 2020-01-21 MED ORDER — TRANEXAMIC ACID-NACL 1000-0.7 MG/100ML-% IV SOLN
1000.0000 mg | INTRAVENOUS | Status: AC
  Administered 2020-01-21: 1000 mg via INTRAVENOUS

## 2020-01-21 MED ORDER — GABAPENTIN 300 MG PO CAPS
300.0000 mg | ORAL_CAPSULE | Freq: Every day | ORAL | Status: DC
  Filled 2020-01-21 (×2): qty 1

## 2020-01-21 MED ORDER — ALUM & MAG HYDROXIDE-SIMETH 200-200-20 MG/5ML PO SUSP: 30.0000 mL | ORAL | Status: DC | PRN

## 2020-01-21 MED ORDER — FERROUS SULFATE 325 (65 FE) MG PO TABS
325.0000 mg | ORAL_TABLET | Freq: Two times a day (BID) | ORAL | Status: DC
  Administered 2020-01-21 – 2020-01-24 (×5): 325 mg via ORAL
  Filled 2020-01-21 (×6): qty 1

## 2020-01-21 MED ORDER — GLYCOPYRROLATE 0.2 MG/ML IJ SOLN
INTRAMUSCULAR | Status: DC | PRN
  Administered 2020-01-21: .2 mg via INTRAVENOUS

## 2020-01-21 MED ORDER — ACETAMINOPHEN 10 MG/ML IV SOLN
INTRAVENOUS | Status: AC
  Filled 2020-01-21: qty 100

## 2020-01-21 MED ORDER — ONDANSETRON HCL 4 MG/2ML IJ SOLN: 4.0000 mg | Freq: Four times a day (QID) | INTRAMUSCULAR | Status: DC | PRN

## 2020-01-21 MED ORDER — CELECOXIB 200 MG PO CAPS: 400.0000 mg | ORAL_CAPSULE | Freq: Once | ORAL | Status: AC

## 2020-01-21 MED ORDER — MAGNESIUM HYDROXIDE 400 MG/5ML PO SUSP
30.0000 mL | Freq: Every day | ORAL | Status: DC
  Administered 2020-01-21 – 2020-01-23 (×3): 30 mL via ORAL
  Filled 2020-01-21 (×3): qty 30

## 2020-01-21 MED ORDER — CEFAZOLIN SODIUM-DEXTROSE 2-4 GM/100ML-% IV SOLN
2.0000 g | Freq: Four times a day (QID) | INTRAVENOUS | Status: AC
  Administered 2020-01-21 – 2020-01-22 (×4): 2 g via INTRAVENOUS
  Filled 2020-01-21 (×4): qty 100

## 2020-01-21 MED ORDER — BUPIVACAINE HCL (PF) 0.5 % IJ SOLN
INTRAMUSCULAR | Status: AC
  Filled 2020-01-21: qty 10

## 2020-01-21 MED ORDER — ONDANSETRON HCL 4 MG PO TABS: 4.0000 mg | ORAL_TABLET | Freq: Four times a day (QID) | ORAL | Status: DC | PRN

## 2020-01-21 MED ORDER — CHLORHEXIDINE GLUCONATE 4 % EX LIQD: 60.0000 mL | Freq: Once | CUTANEOUS | Status: DC

## 2020-01-21 MED ORDER — TRANEXAMIC ACID-NACL 1000-0.7 MG/100ML-% IV SOLN
INTRAVENOUS | Status: AC
  Administered 2020-01-21: 1000 mg via INTRAVENOUS
  Filled 2020-01-21: qty 100

## 2020-01-21 MED ORDER — CEFAZOLIN SODIUM-DEXTROSE 2-4 GM/100ML-% IV SOLN
INTRAVENOUS | Status: AC
  Filled 2020-01-21: qty 100

## 2020-01-21 MED ORDER — ASCORBIC ACID 500 MG PO TABS
500.0000 mg | ORAL_TABLET | Freq: Two times a day (BID) | ORAL | Status: DC
  Administered 2020-01-21 – 2020-01-24 (×6): 500 mg via ORAL
  Filled 2020-01-21 (×6): qty 1

## 2020-01-21 MED ORDER — PHENOL 1.4 % MT LIQD
1.0000 | OROMUCOSAL | Status: DC | PRN
  Filled 2020-01-21: qty 177

## 2020-01-21 MED ORDER — NEOMYCIN-POLYMYXIN B GU 40-200000 IR SOLN
Status: AC
  Filled 2020-01-21: qty 20

## 2020-01-21 MED ORDER — ACETAMINOPHEN 10 MG/ML IV SOLN
1000.0000 mg | Freq: Four times a day (QID) | INTRAVENOUS | Status: AC
  Administered 2020-01-21 – 2020-01-22 (×4): 1000 mg via INTRAVENOUS
  Filled 2020-01-21 (×4): qty 100

## 2020-01-21 MED ORDER — CHLORHEXIDINE GLUCONATE 0.12 % MT SOLN
15.0000 mL | Freq: Once | OROMUCOSAL | Status: AC
  Administered 2020-01-21: 15 mL via OROMUCOSAL

## 2020-01-21 MED ORDER — HYDROMORPHONE HCL 1 MG/ML IJ SOLN: 0.5000 mg | INTRAMUSCULAR | Status: DC | PRN

## 2020-01-21 MED ORDER — ORAL CARE MOUTH RINSE: 15.0000 mL | Freq: Once | OROMUCOSAL | Status: AC

## 2020-01-21 MED ORDER — PANTOPRAZOLE SODIUM 40 MG PO TBEC
40.0000 mg | DELAYED_RELEASE_TABLET | Freq: Two times a day (BID) | ORAL | Status: DC
  Administered 2020-01-21 – 2020-01-24 (×7): 40 mg via ORAL
  Filled 2020-01-21 (×6): qty 1

## 2020-01-21 MED ORDER — PHENYLEPHRINE HCL (PRESSORS) 10 MG/ML IV SOLN
INTRAVENOUS | Status: AC
  Filled 2020-01-21: qty 1

## 2020-01-21 MED ORDER — FENTANYL CITRATE (PF) 100 MCG/2ML IJ SOLN
25.0000 ug | INTRAMUSCULAR | Status: DC | PRN
  Administered 2020-01-21: 50 ug via INTRAVENOUS
  Administered 2020-01-21: 25 ug via INTRAVENOUS
  Administered 2020-01-21: 50 ug via INTRAVENOUS

## 2020-01-21 MED ORDER — CEFAZOLIN SODIUM-DEXTROSE 2-4 GM/100ML-% IV SOLN
2.0000 g | INTRAVENOUS | Status: AC
  Administered 2020-01-21: 2 g via INTRAVENOUS

## 2020-01-21 MED ORDER — FAMOTIDINE 20 MG PO TABS
ORAL_TABLET | ORAL | Status: AC
  Administered 2020-01-21: 20 mg via ORAL
  Filled 2020-01-21: qty 1

## 2020-01-21 MED ORDER — FENTANYL CITRATE (PF) 100 MCG/2ML IJ SOLN
INTRAMUSCULAR | Status: AC
  Filled 2020-01-21: qty 2

## 2020-01-21 MED ORDER — TRAMADOL HCL 50 MG PO TABS
50.0000 mg | ORAL_TABLET | ORAL | Status: DC | PRN
  Administered 2020-01-21 – 2020-01-23 (×4): 50 mg via ORAL
  Filled 2020-01-21 (×5): qty 1

## 2020-01-21 MED ORDER — FAMOTIDINE 20 MG PO TABS: 20.0000 mg | ORAL_TABLET | Freq: Once | ORAL | Status: AC

## 2020-01-21 MED ORDER — OXYCODONE HCL 5 MG PO TABS: 10.0000 mg | ORAL_TABLET | ORAL | Status: DC | PRN

## 2020-01-21 MED ORDER — SODIUM CHLORIDE 0.9 % IV SOLN
INTRAVENOUS | Status: AC
  Filled 2020-01-21: qty 2

## 2020-01-21 MED ORDER — OXYCODONE HCL 5 MG/5ML PO SOLN: 5.0000 mg | Freq: Once | ORAL | Status: DC | PRN

## 2020-01-21 MED ORDER — LACTATED RINGERS IV SOLN: INTRAVENOUS | Status: DC

## 2020-01-21 MED ORDER — DEXAMETHASONE SODIUM PHOSPHATE 10 MG/ML IJ SOLN
INTRAMUSCULAR | Status: AC
  Administered 2020-01-21: 8 mg via INTRAVENOUS
  Filled 2020-01-21: qty 1

## 2020-01-21 MED ORDER — TRANEXAMIC ACID-NACL 1000-0.7 MG/100ML-% IV SOLN
INTRAVENOUS | Status: AC
  Filled 2020-01-21: qty 100

## 2020-01-21 MED ORDER — MENTHOL 3 MG MT LOZG
1.0000 | LOZENGE | OROMUCOSAL | Status: DC | PRN
  Filled 2020-01-21: qty 9

## 2020-01-21 MED ORDER — OXYCODONE HCL 5 MG PO TABS: 5.0000 mg | ORAL_TABLET | ORAL | Status: DC | PRN

## 2020-01-21 MED ORDER — ADULT MULTIVITAMIN W/MINERALS CH
1.0000 | ORAL_TABLET | Freq: Every day | ORAL | Status: DC
  Administered 2020-01-22 – 2020-01-24 (×3): 1 via ORAL
  Filled 2020-01-21 (×7): qty 1

## 2020-01-21 MED ORDER — CELECOXIB 200 MG PO CAPS
ORAL_CAPSULE | ORAL | Status: AC
  Administered 2020-01-21: 400 mg via ORAL
  Filled 2020-01-21: qty 2

## 2020-01-21 MED ORDER — ENOXAPARIN SODIUM 30 MG/0.3ML ~~LOC~~ SOLN
30.0000 mg | Freq: Two times a day (BID) | SUBCUTANEOUS | Status: DC
  Administered 2020-01-22 – 2020-01-24 (×5): 30 mg via SUBCUTANEOUS
  Filled 2020-01-21 (×5): qty 0.3

## 2020-01-21 MED ORDER — FLEET ENEMA 7-19 GM/118ML RE ENEM: 1.0000 | ENEMA | Freq: Once | RECTAL | Status: DC | PRN

## 2020-01-21 MED ORDER — VITAMIN B-12 1000 MCG PO TABS
1000.0000 ug | ORAL_TABLET | Freq: Every day | ORAL | Status: DC
  Administered 2020-01-22 – 2020-01-24 (×3): 1000 ug via ORAL
  Filled 2020-01-21 (×3): qty 1

## 2020-01-21 MED ORDER — BUPIVACAINE HCL (PF) 0.5 % IJ SOLN
INTRAMUSCULAR | Status: DC | PRN
  Administered 2020-01-21: 3 mL

## 2020-01-21 MED ORDER — SODIUM CHLORIDE 0.9 % IV SOLN
INTRAVENOUS | Status: DC | PRN
  Administered 2020-01-21: 40 ug/min via INTRAVENOUS

## 2020-01-21 MED ORDER — ENSURE PRE-SURGERY PO LIQD
296.0000 mL | Freq: Once | ORAL | Status: DC
  Filled 2020-01-21: qty 296

## 2020-01-21 MED ORDER — PROPOFOL 10 MG/ML IV BOLUS
INTRAVENOUS | Status: DC | PRN
  Administered 2020-01-21: 20 mg via INTRAVENOUS

## 2020-01-21 MED ORDER — EPHEDRINE SULFATE 50 MG/ML IJ SOLN
INTRAMUSCULAR | Status: DC | PRN
  Administered 2020-01-21 (×4): 10 mg via INTRAVENOUS

## 2020-01-21 MED ORDER — ACETAMINOPHEN 10 MG/ML IV SOLN
INTRAVENOUS | Status: DC | PRN
Start: 2020-01-21 — End: 2020-01-21
  Administered 2020-01-21: 1000 mg via INTRAVENOUS

## 2020-01-21 MED ORDER — SODIUM CHLORIDE 0.9 % IV SOLN: INTRAVENOUS | Status: DC

## 2020-01-21 MED ORDER — EPHEDRINE 5 MG/ML INJ
INTRAVENOUS | Status: AC
  Filled 2020-01-21: qty 10

## 2020-01-21 MED ORDER — CELECOXIB 200 MG PO CAPS
200.0000 mg | ORAL_CAPSULE | Freq: Two times a day (BID) | ORAL | Status: DC
  Administered 2020-01-21 – 2020-01-24 (×7): 200 mg via ORAL
  Filled 2020-01-21 (×7): qty 1

## 2020-01-21 MED ORDER — BISACODYL 10 MG RE SUPP: 10.0000 mg | Freq: Every day | RECTAL | Status: DC | PRN

## 2020-01-21 MED ORDER — OXYCODONE HCL 5 MG PO TABS: 5.0000 mg | ORAL_TABLET | Freq: Once | ORAL | Status: DC | PRN

## 2020-01-21 SURGICAL SUPPLY — 63 items
BLADE DRUM FLTD (BLADE) ×2 IMPLANT
BLADE SAW 90X25X1.19 OSCILLAT (BLADE) ×2 IMPLANT
CANISTER SUCT 1200ML W/VALVE (MISCELLANEOUS) ×2 IMPLANT
CANISTER SUCT 3000ML PPV (MISCELLANEOUS) ×3 IMPLANT
CARTRIDGE OIL MAESTRO DRILL (MISCELLANEOUS) ×1 IMPLANT
CATH COUDE FOLEY 5CC 14FR (CATHETERS) ×1 IMPLANT
COVER WAND RF STERILE (DRAPES) ×2 IMPLANT
CUP GRIPTION SECTOR 62MM (Orthopedic Implant) ×1 IMPLANT
DIFFUSER DRILL AIR PNEUMATIC (MISCELLANEOUS) ×2 IMPLANT
DRAPE 3/4 80X56 (DRAPES) ×2 IMPLANT
DRAPE INCISE IOBAN 66X60 STRL (DRAPES) ×2 IMPLANT
DRSG DERMACEA 8X12 NADH (GAUZE/BANDAGES/DRESSINGS) ×2 IMPLANT
DRSG OPSITE POSTOP 4X12 (GAUZE/BANDAGES/DRESSINGS) ×2 IMPLANT
DRSG OPSITE POSTOP 4X14 (GAUZE/BANDAGES/DRESSINGS) IMPLANT
DRSG TEGADERM 4X4.75 (GAUZE/BANDAGES/DRESSINGS) ×2 IMPLANT
DURAPREP 26ML APPLICATOR (WOUND CARE) ×3 IMPLANT
ELECT REM PT RETURN 9FT ADLT (ELECTROSURGICAL) ×2
ELECTRODE REM PT RTRN 9FT ADLT (ELECTROSURGICAL) ×1 IMPLANT
GLOVE BIO SURGEON STRL SZ7.5 (GLOVE) ×4 IMPLANT
GLOVE BIOGEL M STRL SZ7.5 (GLOVE) ×4 IMPLANT
GLOVE BIOGEL PI IND STRL 7.5 (GLOVE) ×1 IMPLANT
GLOVE BIOGEL PI INDICATOR 7.5 (GLOVE) ×1
GLOVE INDICATOR 8.0 STRL GRN (GLOVE) ×2 IMPLANT
GOWN STRL REUS W/ TWL LRG LVL3 (GOWN DISPOSABLE) ×2 IMPLANT
GOWN STRL REUS W/ TWL XL LVL3 (GOWN DISPOSABLE) ×1 IMPLANT
GOWN STRL REUS W/TWL LRG LVL3 (GOWN DISPOSABLE) ×2
GOWN STRL REUS W/TWL XL LVL3 (GOWN DISPOSABLE) ×1
HEAD M SROM 36MM PLUS 1.5 (Hips) IMPLANT
HEMOVAC 400CC 10FR (MISCELLANEOUS) ×2 IMPLANT
HOLDER FOLEY CATH W/STRAP (MISCELLANEOUS) ×2 IMPLANT
HOOD PEEL AWAY FLYTE STAYCOOL (MISCELLANEOUS) ×4 IMPLANT
KIT TURNOVER KIT A (KITS) ×2 IMPLANT
LINER MARATHON 4MM 10D 36X62MM (Hips) ×1 IMPLANT
MANIFOLD NEPTUNE II (INSTRUMENTS) ×2 IMPLANT
NDL SAFETY ECLIPSE 18X1.5 (NEEDLE) ×1 IMPLANT
NEEDLE HYPO 18GX1.5 SHARP (NEEDLE) ×1
NS IRRIG 500ML POUR BTL (IV SOLUTION) ×2 IMPLANT
OIL CARTRIDGE MAESTRO DRILL (MISCELLANEOUS) ×2
PACK HIP PROSTHESIS (MISCELLANEOUS) ×2 IMPLANT
PENCIL SMOKE ULTRAEVAC 22 CON (MISCELLANEOUS) ×2 IMPLANT
PIN STEIN THRED 5/32 (Pin) ×2 IMPLANT
PULSAVAC PLUS IRRIG FAN TIP (DISPOSABLE) ×2
SCREW 6.5MMX30MM (Screw) ×1 IMPLANT
SOL .9 NS 3000ML IRR  AL (IV SOLUTION) ×1
SOL .9 NS 3000ML IRR UROMATIC (IV SOLUTION) ×1 IMPLANT
SOL PREP PVP 2OZ (MISCELLANEOUS) ×2
SOLUTION PREP PVP 2OZ (MISCELLANEOUS) ×1 IMPLANT
SPONGE DRAIN TRACH 4X4 STRL 2S (GAUZE/BANDAGES/DRESSINGS) ×2 IMPLANT
SROM M HEAD 36MM PLUS 1.5 (Hips) ×2 IMPLANT
STAPLER SKIN PROX 35W (STAPLE) ×2 IMPLANT
STEM FEM CMNTLSS LG AML 15.0 (Hips) ×1 IMPLANT
STOCKINETTE IMPERV 14X48 (MISCELLANEOUS) ×1 IMPLANT
SUT ETHIBOND #5 BRAIDED 30INL (SUTURE) ×2 IMPLANT
SUT VIC AB 0 CT1 36 (SUTURE) ×2 IMPLANT
SUT VIC AB 1 CT1 36 (SUTURE) ×4 IMPLANT
SUT VIC AB 2-0 CT1 27 (SUTURE) ×1
SUT VIC AB 2-0 CT1 TAPERPNT 27 (SUTURE) ×1 IMPLANT
SYR 20ML LL LF (SYRINGE) ×2 IMPLANT
TAPE CLOTH 3X10 WHT NS LF (GAUZE/BANDAGES/DRESSINGS) ×2 IMPLANT
TAPE TRANSPORE STRL 2 31045 (GAUZE/BANDAGES/DRESSINGS) ×2 IMPLANT
TIP FAN IRRIG PULSAVAC PLUS (DISPOSABLE) ×1 IMPLANT
TOWEL OR 17X26 4PK STRL BLUE (TOWEL DISPOSABLE) ×2 IMPLANT
TRAY FOLEY MTR SLVR 16FR STAT (SET/KITS/TRAYS/PACK) ×2 IMPLANT

## 2020-01-21 NOTE — Transfer of Care (Signed)
Immediate Anesthesia Transfer of Care Note  Patient: Jerome [name].  Procedure(s) Performed: TOTAL HIP ARTHROPLASTY (Right Hip)  Patient Location: PACU  Anesthesia Type:Spinal  Level of Consciousness: awake and alert   Airway & Oxygen Therapy: Patient Spontanous Breathing  Post-op Assessment: Report given to RN and Post -op Vital signs reviewed and stable  Post vital signs: Reviewed and stable  Last Vitals:  Vitals Value Taken Time  BP 121/76 01/21/20 1301  Temp 36.4 C 01/21/20 1301  Pulse 65 01/21/20 1301  Resp 18 01/21/20 1301  SpO2 99 % 01/21/20 1301    Last Pain:  Vitals:   01/21/20 0737  TempSrc: Tympanic  PainSc: 10-Worst pain ever         Complications: No complications documented.

## 2020-01-21 NOTE — Evaluation (Signed)
Physical Therapy Evaluation Patient Details Name: Jerome [name]. MRN: [id number] DOB: January 13, 1935 Today's Date: 01/21/2020   History of Present Illness  Pt is 85 y.o. male s/p R THA.  PMH includes: HTN, CAD, CHF, renal disease, arthritis, anxiety, CKD, and depression.  Clinical Impression  Pt upright in bed with HOB elevated and agreeable to education on therapy.  Pt was given meal, and was not prepared to perform exercises, so educated provided on hip precautions and importance of adhering to those precautions necessary for proper healing.  Pt also educated on importance of PT and our services that include methods of making patient stronger and making the patient more functional prior to discharge.  Currently, pt is aware of precautions and will continue to abide by those precautions in the future.  Pt d/c options were discussed and SNF was discussed as the most beneficial place for d/c to occur due to previous level of function and decreased caregiver support.  Pt will benefit from PT services in a SNF setting upon discharge to safely address deficits listed in patient problem list for decreased caregiver assistance and eventual return to PLOF.     Follow Up Recommendations SNF    Equipment Recommendations  Other (comment) (Pt may benefit from wheelchair cushion for elevated surface.)    Recommendations for Other Services       Precautions / Restrictions Precautions Precautions: Posterior Hip Precaution Booklet Issued: Yes (comment) Precaution Comments: Pt educated on importance of keeping precautions. Restrictions Weight Bearing Restrictions: Yes RLE Weight Bearing: Weight bearing as tolerated      Mobility  Bed Mobility                  Transfers                    Ambulation/Gait                Stairs            Wheelchair Mobility    Modified Rankin (Stroke Patients Only)       Balance                                              Pertinent Vitals/Pain Pain Assessment: 0-10 Pain Score: 10-Worst pain ever Pain Location: R Hip Pain Descriptors / Indicators: Aching;Discomfort Pain Intervention(s): Limited activity within patient's tolerance;Monitored during session    Home Living Family/patient expects to be discharged to:: Private residence Living Arrangements: Children Available Help at Discharge: Family Type of Home: House Home Access: Ramped entrance     Home Layout: One level Home Equipment: Environmental consultant - 2 wheels;Cane - single point;Wheelchair - manual      Prior Function Level of Independence: Needs assistance   Gait / Transfers Assistance Needed: Pt stated that he transfers out of his hospital bed at home and ambulates household distances with RW. Has WC for community ambulation. Needs physical assistance to transfer off commode at home  ADL's / Homemaking Assistance Needed: sons performs IADLs, needs assistance wiping after using the bathroom from his son        Hand Dominance        Extremity/Trunk Assessment   Upper Extremity Assessment Upper Extremity Assessment: Overall WFL for tasks assessed    Lower Extremity Assessment Lower Extremity Assessment: Generalized weakness;RLE deficits/detail RLE Deficits / Details: Weakness s/p surgery RLE:  Unable to fully assess due to pain RLE Sensation: WNL       Communication   Communication: No difficulties  Cognition Arousal/Alertness: Awake/alert Behavior During Therapy: WFL for tasks assessed/performed Overall Cognitive Status: Within Functional Limits for tasks assessed                                        General Comments      Exercises Total Joint Exercises Ankle Circles/Pumps: AROM;Strengthening;Both;10 reps;Supine Other Exercises Other Exercises: Pt educated on importance of adhering to posterior precautions for safety and to allow proper healing of the hip. Other Exercises: Pt educated on PT  and importance of exercises to allow pt to become more independent and return to PLOF.   Assessment/Plan    PT Assessment Patient needs continued PT services  PT Problem List Decreased strength;Decreased range of motion;Decreased activity tolerance;Decreased mobility;Decreased knowledge of use of DME;Decreased safety awareness;Obesity;Pain       PT Treatment Interventions DME instruction;Gait training;Stair training;Functional mobility training;Therapeutic activities;Therapeutic exercise;Balance training;Patient/family education    PT Goals (Current goals can be found in the Care Plan section)  Acute Rehab PT Goals Patient Stated Goal: To get stronger. PT Goal Formulation: With patient Time For Goal Achievement: 02/04/20 Potential to Achieve Goals: Good    Frequency BID   Barriers to discharge Inaccessible home environment Pt unable to use shower/tub due to size constraints of room.    Co-evaluation               AM-PAC PT "6 Clicks" Mobility  Outcome Measure Help needed turning from your back to your side while in a flat bed without using bedrails?: A Lot Help needed moving from lying on your back to sitting on the side of a flat bed without using bedrails?: A Lot Help needed moving to and from a bed to a chair (including a wheelchair)?: A Lot Help needed standing up from a chair using your arms (e.g., wheelchair or bedside chair)?: A Lot Help needed to walk in hospital room?: Total Help needed climbing 3-5 steps with a railing? : Total 6 Click Score: 10    End of Session Equipment Utilized During Treatment: Gait belt Activity Tolerance: Patient limited by pain Patient left: in bed;with call bell/phone within reach;with bed alarm set;with family/visitor present;with SCD's reapplied Nurse Communication: Precautions PT Visit Diagnosis: Unsteadiness on feet (R26.81);Other abnormalities of gait and mobility (R26.89);Muscle weakness (generalized) (M62.81);History of falling  (Z91.81);Difficulty in walking, not elsewhere classified (R26.2);Pain Pain - Right/Left: Right Pain - part of body: Hip    Time: 1655-3748 PT Time Calculation (min) (ACUTE ONLY): 27 min   Charges:   PT Evaluation $PT Eval Low Complexity: 1 Low PT Treatments $Therapeutic Activity: 8-22 mins        Nolon Bussing, PT, DPT 01/21/20, 4:32 PM

## 2020-01-21 NOTE — H&P (Signed)
The patient has been re-examined, and the chart reviewed, and there have been no interval changes to the documented history and physical.    The risks, benefits, and alternatives have been discussed at length. The patient expressed understanding of the risks benefits and agreed with plans for surgical intervention.  Diamon Reddinger P. Krishav Mamone, Jr. M.D.    

## 2020-01-21 NOTE — TOC Initial Note (Signed)
Transition of Care (TOC) - Initial/Assessment Note    Patient Details  Name: Jerome [name]. MRN: [id number] Date of Birth: 12/03/1934  Transition of Care [facility]) CM/SW Contact:    Elease Hashimoto, LCSW Phone Number: 01/21/2020, 3:16 PM  Clinical Narrative:   Met with pt to discuss discharge plans. [facility] and feels he needs to go to one of them this time to give his son a break and he can improve more. He is aware the PT will need to recommend this and will try to get him into [place] the one he chooses to go to. He will also need a three day LOS due to medicare coverage. He reports his son takes care of him and his own son whom lives with them part time. Will begin Fl2 and await PT evaluation. He has all of his equipment from previous surgeries and has had [facility] in the past. Will see in am after PT evaluates him and makes recommendations. Work on appropriate discharge plan.                Expected Discharge Plan: [place] (Depends upon PT recommendation) Barriers to Discharge: No Barriers Identified   Patient Goals and CMS Choice Patient states their goals for this hospitalization and ongoing recovery are:: I was thinking I would go to a facility fro one week then home and give my son a break CMS Medicare.gov Compare Post Acute Care list provided to:: Patient Choice offered to / list presented to : Patient  Expected Discharge Plan and Services Expected Discharge Plan: [place] (Depends upon PT recommendation) In-house Referral: Clinical Social Work   Post Acute Care Choice: [place] Living arrangements for the past 2 months: [place]                                      Prior Living Arrangements/Services Living arrangements for the past 2 months: Single Family Home Lives with:: Adult Children, Other (Comment) (grandson) Patient language and need for interpreter reviewed::  No        Need for Family Participation in Patient Care: Yes (Comment) Care giver support system in place?: Yes (comment) Current home services: DME (has elevated commode, rw and wheelchair) Criminal Activity/Legal Involvement Pertinent to Current Situation/Hospitalization: No - Comment as needed  Activities of Daily Living Home Assistive Devices/Equipment: Environmental consultant (specify type), Wheelchair, Eyeglasses, Dentures (specify type) ADL Screening (condition at time of admission) Patient's cognitive ability adequate to safely complete daily activities?: No Is the patient deaf or have difficulty hearing?: No Does the patient have difficulty seeing, even when wearing glasses/contacts?: No Does the patient have difficulty concentrating, remembering, or making decisions?: No Patient able to express need for assistance with ADLs?: Yes Does the patient have difficulty dressing or bathing?: Yes Independently performs ADLs?: No Communication: Independent Dressing (OT): Needs assistance Is this a change from baseline?: Pre-admission baseline Grooming: Needs assistance Is this a change from baseline?: Pre-admission baseline Feeding: Independent Bathing: Needs assistance Is this a change from baseline?: Pre-admission baseline Toileting: Needs assistance Is this a change from baseline?: Pre-admission baseline In/Out Bed: Needs assistance Is this a change from baseline?: Pre-admission baseline Walks in Home: Needs assistance Is this a change from baseline?: Pre-admission baseline Does the patient have difficulty walking or climbing stairs?: Yes Weakness of Legs: Both Weakness of Arms/Hands: Both  Permission Sought/Granted Permission sought to share information with : Family Supports, Chartered certified accountant granted to share information with : Yes, Verbal Permission Granted  Share Information with NAME: Shanon Brow  Permission granted to share info w AGENCY: SNF's  Permission granted  to share info w Relationship: son     Emotional Assessment Appearance:: Appears stated age Attitude/Demeanor/Rapport: Gracious Affect (typically observed): Adaptable, Accepting Orientation: : Oriented to Self, Oriented to Place, Oriented to  Time, Oriented to Situation   Psych Involvement: No (comment)  Admission diagnosis:  Hx of total hip arthroplasty, right [K93.818] Patient Active Problem List   Diagnosis Date Noted  . Hx of total hip arthroplasty, right 01/21/2020  . Sepsis ([place]) 03/20/2019  . Chronic systolic CHF (congestive heart failure) ([place]) 03/20/2019  . CAD (coronary artery disease) 03/20/2019  . HTN (hypertension) 03/20/2019  . Acute on chronic renal failure ([place]) 03/20/2019  . Severe sepsis ([place]) 03/20/2019  . CKD (chronic kidney disease) stage 3, GFR 30-59 ml/min 02/11/2019  . Right leg pain 12/13/2018  . Acute exacerbation of CHF (congestive heart failure) ([place]) 12/09/2018  . Benign essential hypertension 01/12/2014  . Gout 01/12/2014   PCP:  Derinda Late, MD Pharmacy:   [id number] [place] [id number]: [phone]: [phone]    Social Determinants of Health (SDOH) Interventions    Readmission Risk Interventions Readmission Risk Prevention Plan 12/11/2018  Post Dischage Appt Complete  Medication Screening Complete  Transportation Screening Complete  Some recent data might be hidden

## 2020-01-21 NOTE — Op Note (Signed)
OPERATIVE NOTE  DATE OF SURGERY:  01/21/2020  PATIENT NAME:  Jerome [name].   DOB: Nov 07, 1934  MRN: [id number]  PRE-OPERATIVE DIAGNOSIS: Degenerative arthrosis of the right hip, primary  POST-OPERATIVE DIAGNOSIS:  Same  PROCEDURE:  Right total hip arthroplasty  SURGEON:  Jena Gauss. M.D.  ASSISTANT: Baldwin Jamaica, PA-C (present and scrubbed throughout the case, critical for assistance with exposure, retraction, instrumentation, and closure)  ANESTHESIA: spinal  ESTIMATED BLOOD LOSS: 150 mL  FLUIDS REPLACED: 1000 mL of crystalloid  DRAINS: 2 medium drains to a Hemovac reservoir  IMPLANTS UTILIZED: DePuy 15 mm large stature AML femoral stem, 62 mm OD Pinnacle Gription Sector acetabular component, 6.5 x 30 mm cancellous screw,neutral Pinnacle Marathon polyethylene insert, and a 36 mm M-SPEC +1.5 mm hip ball  INDICATIONS FOR SURGERY: Jerome [name]. is a 85 y.o. year old male with a long history of progressive hip and groin  pain. X-rays demonstrated severe degenerative changes. The patient had not seen any significant improvement despite conservative nonsurgical intervention. After discussion of the risks and benefits of surgical intervention, the patient expressed understanding of the risks benefits and agree with plans for total hip arthroplasty.   The risks, benefits, and alternatives were discussed at length including but not limited to the risks of infection, bleeding, nerve injury, stiffness, blood clots, the need for revision surgery, limb length inequality, dislocation, cardiopulmonary complications, among others, and they were willing to proceed.  PROCEDURE IN DETAIL: The patient was brought into the operating room and, after adequate spinal anesthesia was achieved, the patient was placed in a left lateral decubitus position. Axillary roll was placed and all bony prominences were well-padded. The patient's right hip was cleaned and prepped with alcohol and  DuraPrep and draped in the usual sterile fashion. A "timeout" was performed as per usual protocol. A lateral curvilinear incision was made gently curving towards the posterior superior iliac spine. The IT band was incised in line with the skin incision and the fibers of the gluteus maximus were split in line. The piriformis tendon was identified, skeletonized, and incised at its insertion to the proximal femur and reflected posteriorly. A T type posterior capsulotomy was performed. Prior to dislocation of the femoral head, a threaded Steinmann pin was inserted through a separate stab incision into the pelvis superior to the acetabulum and bent in the form of a stylus so as to assess limb length and hip offset throughout the procedure.  The gluteal sling was incised so as to better mobilize the proximal femur.  The femoral head was then dislocated posteriorly. Inspection of the femoral head demonstrated severe degenerative changes with full-thickness loss of articular cartilage. The femoral neck cut was performed using an oscillating saw. The anterior capsule was elevated off of the femoral neck using a periosteal elevator. Attention was then directed to the acetabulum. The remnant of the labrum was excised using electrocautery. Inspection of the acetabulum also demonstrated significant degenerative changes. The acetabulum was reamed in sequential fashion up to a 61 mm diameter. Good punctate bleeding bone was encountered. A 62 mm Pinnacle Gription Sector acetabular component was positioned and impacted into place. Good scratch fit was appreciated.  A 6.5 mm x 30 mm cancellous screw was inserted through a dome hole.  A neutral polyethylene trial was inserted.  Circumferential acetabular osteophytes were excised using osteotomes and a rondure.  Attention was then directed to the proximal femur. A hole for reaming of the proximal femoral canal was created  using a high-speed burr. The femoral canal was reamed in  sequential fashion up to a 14.5 mm diameter. This allowed for approximately 7 cm of scratch fit.  It was thus elected to ream to a 15 mm diameter to allow for a line to line fit.  Serial broaches were inserted up to a 15 mm large stature femoral broach. Calcar region was planed and a trial reduction was performed using a 36 mm hip ball with a +1.5 mm neck length. Improved equalization of limb lengths and hip offset was appreciated and excellent stability was noted both anteriorly and posteriorly. Trial components were removed. The acetabular shell was irrigated with copious amounts of normal saline with antibiotic solution and suctioned dry. A neutral Pinnacle Marathon polyethylene insert was positioned and impacted into place. Next, a 15 mm large stature AML femoral stem was positioned and impacted into place. Excellent scratch fit was appreciated. A trial reduction was again performed with a 36 mm hip ball with a +1.5 mm neck length. Again, good equalization of limb lengths was appreciated and excellent stability appreciated both anteriorly and posteriorly. The hip was then dislocated and the trial hip ball was removed. The Morse taper was cleaned and dried. A 36 mm M-SPEC hip ball with a +1.5 mm neck length was placed on the trunnion and impacted into place. The hip was then reduced and placed through range of motion. Excellent stability was appreciated both anteriorly and posteriorly.  The wound was irrigated with copious amounts of normal saline with antibiotic solution and suctioned dry. Good hemostasis was appreciated. The posterior capsulotomy was repaired using #5 Ethibond.  The gluteal sling was repaired using interrupted sutures of #5 Ethibond.  Two medium drains were placed in the wound bed and brought out through separate stab incisions to be attached to a Hemovac reservoir. The IT band was reapproximated using interrupted sutures of #1 Vicryl. Subcutaneous tissue was approximated using first #0  Vicryl followed by #2-0 Vicryl. The skin was closed with skin staples.  The patient tolerated the procedure well and was transported to the recovery room in stable condition.   Jena Gauss., M.D.

## 2020-01-21 NOTE — Anesthesia Procedure Notes (Addendum)
Spinal  Patient location during procedure: OR Start time: 01/21/2020 8:30 AM End time: 01/21/2020 8:43 AM Staffing Performed: resident/CRNA  Resident/CRNA: Johney Maine D, CRNA Preanesthetic Checklist Completed: patient identified, IV checked, site marked, risks and benefits discussed, surgical consent, monitors and equipment checked, pre-op evaluation and timeout performed Spinal Block Patient position: sitting Prep: ChloraPrep Patient monitoring: heart rate, cardiac monitor, continuous pulse ox and blood pressure Approach: midline Location: L3-4 Injection technique: single-shot Needle Needle type: Pencan  Needle gauge: 24 G Needle length: 10 cm Assessment Sensory level: T4

## 2020-01-21 NOTE — Anesthesia Preprocedure Evaluation (Signed)
Anesthesia Evaluation  Patient identified by MRN, date of birth, ID band Patient awake    Reviewed: Allergy & Precautions, H&P , NPO status , Patient's Chart, lab work & pertinent test results  History of Anesthesia Complications Negative for: history of anesthetic complications  Airway Mallampati: III  TM Distance: <3 FB Neck ROM: limited    Dental  (+) Edentulous Lower, Edentulous Upper   Pulmonary neg shortness of breath,    Pulmonary exam normal        Cardiovascular Exercise Tolerance: Good hypertension, (-) angina+ CAD and +CHF  (-) Past MI and (-) DOE Normal cardiovascular exam     Neuro/Psych PSYCHIATRIC DISORDERS negative neurological ROS     GI/Hepatic negative GI ROS, Neg liver ROS, neg GERD  ,  Endo/Other  negative endocrine ROS  Renal/GU Renal disease     Musculoskeletal  (+) Arthritis ,   Abdominal   Peds  Hematology negative hematology ROS (+)   Anesthesia Other Findings Past Medical History: No date: Anxiety No date: Arthritis No date: CAD (coronary artery disease) No date: Chronic kidney disease     Comment:  stage 3 No date: Chronic systolic CHF (congestive heart failure) (HCC) No date: Depression No date: Hypertension  Past Surgical History: No date: APPENDECTOMY No date: HERNIA REPAIR; Right     Comment:  inguinal No date: JOINT REPLACEMENT; Right     Comment:  knee  BMI    Body Mass Index: 32.85 kg/m      Reproductive/Obstetrics negative OB ROS                             Anesthesia Physical Anesthesia Plan  ASA: III  Anesthesia Plan: Spinal   Post-op Pain Management:    Induction:   PONV Risk Score and Plan:   Airway Management Planned: Natural Airway and Nasal Cannula  Additional Equipment:   Intra-op Plan:   Post-operative Plan:   Informed Consent: I have reviewed the patients History and Physical, chart, labs and discussed the  procedure including the risks, benefits and alternatives for the proposed anesthesia with the patient or authorized representative who has indicated his/her understanding and acceptance.     Dental Advisory Given  Plan Discussed with: Anesthesiologist, CRNA and Surgeon  Anesthesia Plan Comments: (Patient reports no bleeding problems and no anticoagulant use.  Plan for spinal with backup GA  Patient consented for risks of anesthesia including but not limited to:  - adverse reactions to medications - damage to eyes, teeth, lips or other oral mucosa - nerve damage due to positioning  - risk of bleeding, infection and or nerve damage from spinal that could lead to paralysis - risk of headache or failed spinal - damage to teeth, lips or other oral mucosa - sore throat or hoarseness - damage to heart, brain, nerves, lungs, other parts of body or loss of life  Patient voiced understanding.)        Anesthesia Quick Evaluation

## 2020-01-22 ENCOUNTER — Encounter: Payer: Self-pay | Admitting: Orthopedic Surgery

## 2020-01-22 NOTE — Progress Notes (Signed)
OT Cancellation Note  Patient Details Name: Jerome [name]. MRN: [id number] DOB: May 12, 1935   Cancelled Treatment:    Reason Eval/Treat Not Completed: Patient declined, no reason specified. Consult received, chart reviewed. Pt on the phone upon initial attempt. On 2nd attempt, pt with staff for care; agreeable to OT returning this afternoon for evaluation.   Richrd Prime, MPH, MS, OTR/L ascom [phone] 01/22/20, 11:43 AM

## 2020-01-22 NOTE — Progress Notes (Signed)
Physical Therapy Treatment Patient Details Name: Jerome [name]. MRN: [id number] DOB: 03-04-1935 Today's Date: 01/22/2020    History of Present Illness Pt is 85 y.o. male s/p R THA.  PMH includes: HTN, CAD, CHF, renal disease, arthritis, anxiety, CKD, and depression.    PT Comments    Pt received in semi-Fowler's position in bed and agreeable to therapy.  Pt was able to perform supine and seated exercises before attempting to stand up.  Pt able to transfer in the bed with modA using BUE's to pull on grab bars to turn and bring patient seated EOB.  Pt very deliberate with movements.  Pt then proceeded to standing with heavy use of UE's through FWW and elevated bed in order to stand.  Pt able to maneuver FWW with verbal cuing for hand placement and able to transition to chair.  PT had to support walker for transfers to/from sit <> stand to prevent the FWW from tipping.  Pt will benefit from PT services in a SNF setting upon discharge to safely address deficits listed in patient problem list for decreased caregiver assistance and eventual return to PLOF.     Follow Up Recommendations  SNF     Equipment Recommendations  Other (comment)    Recommendations for Other Services       Precautions / Restrictions Precautions Precautions: Posterior Hip Precaution Booklet Issued: Yes (comment) Precaution Comments: Pt educated on importance of keeping precautions. Restrictions Weight Bearing Restrictions: Yes RLE Weight Bearing: Weight bearing as tolerated    Mobility  Bed Mobility Overal bed mobility: Needs Assistance Bed Mobility: Supine to Sit     Supine to sit: Mod assist     General bed mobility comments: With use of UE to pull on grab bars.  UE has decreasedmobility and lacks full ER requires to grab railing at times.  Transfers Overall transfer level: Needs assistance Equipment used: Rolling walker (2 wheeled) Transfers: Sit to/from Stand Sit to Stand: Mod assist          General transfer comment: Heavy use of UE through FWW with PT supporting FWW.  Pt educated on pushing through the bed, but pt denies ability to do so because the "bed is too soft".  Elevated bed also required for pt to stand.  Ambulation/Gait Ambulation/Gait assistance: Mod assist Gait Distance (Feet): 4 Feet Assistive device: Rolling walker (2 wheeled) Gait Pattern/deviations: Step-to pattern;Decreased step length - right;Decreased step length - left;Decreased stance time - right;Decreased stride length Gait velocity: decreased   General Gait Details: Pt very deliberate with movements, and uses heavy UE support to push through FWW.   Stairs             Wheelchair Mobility    Modified Rankin (Stroke Patients Only)       Balance Overall balance assessment: Needs assistance Sitting-balance support: Bilateral upper extremity supported;Feet supported Sitting balance-Leahy Scale: Poor     Standing balance support: Bilateral upper extremity supported Standing balance-Leahy Scale: Poor Standing balance comment: Pt require heavy use of UE to remain upright.                            Cognition                                              Exercises Total Joint Exercises  Ankle Circles/Pumps: AROM;Strengthening;Both;10 reps;Supine Quad Sets: AROM;Strengthening;Both;10 reps;Supine Gluteal Sets: AROM;Strengthening;Both;10 reps;Supine Heel Slides: AROM;Strengthening;Both;10 reps;Seated Long Arc Quad: AROM;Strengthening;Both;10 reps;Seated Marching in Standing: AROM;Strengthening;Both;5 reps;Standing Other Exercises Other Exercises: Pt with continued education on posterior precautions for safety and to allow healing of the hip.    General Comments        Pertinent Vitals/Pain Pain Assessment: Faces Faces Pain Scale: Hurts little more Pain Location: R Hip Pain Descriptors / Indicators: Aching;Discomfort Pain Intervention(s): Limited  activity within patient's tolerance;Monitored during session    Home Living                      Prior Function            PT Goals (current goals can now be found in the care plan section) Acute Rehab PT Goals Patient Stated Goal: To get stronger. PT Goal Formulation: With patient Time For Goal Achievement: 02/04/20 Potential to Achieve Goals: Good Progress towards PT goals: Progressing toward goals    Frequency    BID      PT Plan      Co-evaluation              AM-PAC PT "6 Clicks" Mobility   Outcome Measure  Help needed turning from your back to your side while in a flat bed without using bedrails?: A Lot Help needed moving from lying on your back to sitting on the side of a flat bed without using bedrails?: A Lot Help needed moving to and from a bed to a chair (including a wheelchair)?: A Lot Help needed standing up from a chair using your arms (e.g., wheelchair or bedside chair)?: A Lot Help needed to walk in hospital room?: A Lot Help needed climbing 3-5 steps with a railing? : Total 6 Click Score: 11    End of Session Equipment Utilized During Treatment: Gait belt Activity Tolerance: Patient limited by pain Patient left: in chair;with call bell/phone within reach;with chair alarm set Nurse Communication: Precautions PT Visit Diagnosis: Unsteadiness on feet (R26.81);Other abnormalities of gait and mobility (R26.89);Muscle weakness (generalized) (M62.81);History of falling (Z91.81);Difficulty in walking, not elsewhere classified (R26.2);Pain Pain - Right/Left: Right Pain - part of body: Hip     Time: 7616-0737 PT Time Calculation (min) (ACUTE ONLY): 31 min  Charges:  $Gait Training: 8-22 mins $Therapeutic Exercise: 8-22 mins                     Nolon Bussing, PT, DPT 01/22/20, 11:05 AM

## 2020-01-22 NOTE — NC FL2 (Signed)
[place] MEDICAID FL2 LEVEL OF CARE SCREENING TOOL     IDENTIFICATION  Patient Name: Jerome [name]. Birthdate: 02-14-1935 Sex: male Admission Date (Current Location): 01/21/2020  [place] and [place] Number:  Chiropodist and Address:  [facility], [address], [place], [place] [address]      Provider Number: [id number]  Attending Physician Name and Address:  Donato Heinz, MD  Relative Name and Phone Number:  Vash Quezada [phone]    Current Level of Care: Hospital Recommended Level of Care: Skilled Nursing Facility Prior Approval Number:    Date Approved/Denied:   PASRR Number: [id number]  Discharge Plan: SNF    Current Diagnoses: Patient Active Problem List   Diagnosis Date Noted  . Hx of total hip arthroplasty, right 01/21/2020  . Sepsis (HCC) 03/20/2019  . Chronic systolic CHF (congestive heart failure) (HCC) 03/20/2019  . CAD (coronary artery disease) 03/20/2019  . HTN (hypertension) 03/20/2019  . Acute on chronic renal failure (HCC) 03/20/2019  . Severe sepsis (HCC) 03/20/2019  . CKD (chronic kidney disease) stage 3, GFR 30-59 ml/min 02/11/2019  . Right leg pain 12/13/2018  . Acute exacerbation of CHF (congestive heart failure) (HCC) 12/09/2018  . Benign essential hypertension 01/12/2014  . Gout 01/12/2014    Orientation RESPIRATION BLADDER Height & Weight     Self, Time, Situation, Place  Normal External catheter Weight: 216 lb 0.8 oz (98 kg) Height:  5\' 8"  (172.7 cm)  BEHAVIORAL SYMPTOMS/MOOD NEUROLOGICAL BOWEL NUTRITION STATUS      Continent Diet (regular diet with thin liquids)  AMBULATORY STATUS COMMUNICATION OF NEEDS Skin   Limited Assist Verbally Surgical wounds                       Personal Care Assistance Level of Assistance  Bathing, Dressing Bathing Assistance: Limited assistance   Dressing Assistance: Limited assistance     Functional Limitations Info              SPECIAL CARE FACTORS FREQUENCY  PT (By licensed PT)     PT Frequency: 5x week              Contractures Contractures Info: Present (R-knee contracture)    Additional Factors Info  Code Status, Allergies Code Status Info: Full Code Allergies Info: Gabapentin           Current Medications (01/22/2020):  This is the current hospital active medication list Current Facility-Administered Medications  Medication Dose Route Frequency Provider Last Rate Last Admin  . 0.9 %  sodium chloride infusion   Intravenous Continuous 03/23/2020, MD 100 mL/hr at 01/21/20 2328 New Bag at 01/21/20 2328  . acetaminophen (OFIRMEV) IV 1,000 mg  1,000 mg Intravenous Q6H Hooten, 2329, MD 400 mL/hr at 01/22/20 0548 1,000 mg at 01/22/20 0548  . acetaminophen (TYLENOL) tablet 325-650 mg  325-650 mg Oral Q6H PRN Hooten, 03/23/20, MD      . alum & mag hydroxide-simeth (MAALOX/MYLANTA) 200-200-20 MG/5ML suspension 30 mL  30 mL Oral Q4H PRN Hooten, 12-26-2000, MD      . amLODipine (NORVASC) tablet 5 mg  5 mg Oral Daily Hooten, Illene Labrador, MD      . ascorbic acid (VITAMIN C) tablet 500 mg  500 mg Oral BID Illene Labrador, MD   500 mg at 01/22/20 03/23/20  . bisacodyl (DULCOLAX) suppository 10 mg  10 mg Rectal Daily PRN Hooten, 2993, MD      .  carvedilol (COREG) tablet 6.25 mg  6.25 mg Oral BID WC Hooten, Illene Labrador, MD      . ceFAZolin (ANCEF) IVPB 2g/100 mL premix  2 g Intravenous Q6H Hooten, Illene Labrador, MD 200 mL/hr at 01/22/20 0224 2 g at 01/22/20 0224  . celecoxib (CELEBREX) capsule 200 mg  200 mg Oral BID Donato Heinz, MD   200 mg at 01/22/20 5597  . diphenhydrAMINE (BENADRYL) 12.5 MG/5ML elixir 12.5-25 mg  12.5-25 mg Oral Q4H PRN Hooten, Illene Labrador, MD      . enoxaparin (LOVENOX) injection 30 mg  30 mg Subcutaneous Q12H Hooten, Illene Labrador, MD   30 mg at 01/22/20 0835  . ferrous sulfate tablet 325 mg  325 mg Oral BID WC Donato Heinz, MD   325 mg at 01/22/20 4163  . gabapentin (NEURONTIN) capsule 300 mg  300 mg  Oral QHS Hooten, Illene Labrador, MD      . HYDROmorphone (DILAUDID) injection 0.5-1 mg  0.5-1 mg Intravenous Q4H PRN Hooten, Illene Labrador, MD      . magnesium hydroxide (MILK OF MAGNESIA) suspension 30 mL  30 mL Oral Daily Hooten, Illene Labrador, MD   30 mL at 01/22/20 8453  . menthol-cetylpyridinium (CEPACOL) lozenge 3 mg  1 lozenge Oral PRN Hooten, Illene Labrador, MD       Or  . phenol (CHLORASEPTIC) mouth spray 1 spray  1 spray Mouth/Throat PRN Hooten, Illene Labrador, MD      . metoCLOPramide (REGLAN) tablet 10 mg  10 mg Oral TID AC & HS Hooten, Illene Labrador, MD   10 mg at 01/22/20 6468  . multivitamin with minerals tablet 1 tablet  1 tablet Oral Daily Hooten, Illene Labrador, MD   1 tablet at 01/22/20 [phone]  . ondansetron (ZOFRAN) tablet 4 mg  4 mg Oral Q6H PRN Hooten, Illene Labrador, MD       Or  . ondansetron (ZOFRAN) injection 4 mg  4 mg Intravenous Q6H PRN Hooten, Illene Labrador, MD      . oxyCODONE (Oxy IR/ROXICODONE) immediate release tablet 10 mg  10 mg Oral Q4H PRN Hooten, Illene Labrador, MD      . oxyCODONE (Oxy IR/ROXICODONE) immediate release tablet 5 mg  5 mg Oral Q4H PRN Hooten, Illene Labrador, MD      . pantoprazole (PROTONIX) EC tablet 40 mg  40 mg Oral BID Donato Heinz, MD   40 mg at 01/22/20 2248  . senna-docusate (Senokot-S) tablet 1 tablet  1 tablet Oral BID Donato Heinz, MD   1 tablet at 01/21/20 2114  . sodium phosphate (FLEET) 7-19 GM/118ML enema 1 enema  1 enema Rectal Once PRN Hooten, Illene Labrador, MD      . tamsulosin (FLOMAX) capsule 0.4 mg  0.4 mg Oral q1800 Hooten, Illene Labrador, MD   0.4 mg at 01/21/20 1711  . traMADol (ULTRAM) tablet 50-100 mg  50-100 mg Oral Q4H PRN Hooten, Illene Labrador, MD   50 mg at 01/21/20 1710  . vitamin B-12 (CYANOCOBALAMIN) tablet 1,000 mcg  1,000 mcg Oral Daily Hooten, Illene Labrador, MD   1,000 mcg at 01/22/20 2500     Discharge Medications: Please see discharge summary for a list of discharge medications.  Relevant Imaging Results:  Relevant Lab Results:   Additional Information SSN: [id number]  Collins Kerby, Lemar Livings, LCSW

## 2020-01-22 NOTE — TOC Progression Note (Signed)
Transition of Care [facility]) - Progression Note    Patient Details  Name: Jerome [name]. MRN: [id number] Date of Birth: September 17, 1934  [facility]) CM/SW Contact  Ivanell Deshotel, Lemar Livings, LCSW Phone Number: 01/22/2020, 2:57 PM  Clinical Narrative:   [facility] has accepted pt but his third day will be Sat so can transfer Sat. They will need DC summary and he will need a COVID test by tomorrow so they can take on Sat. Have secured messaged PA to let him know. Will follow up with tomorrow.    Expected Discharge Plan: Skilled Nursing Facility (Depends upon PT recommendation) Barriers to Discharge: No Barriers Identified  Expected Discharge Plan and Services Expected Discharge Plan: Skilled Nursing Facility (Depends upon PT recommendation) In-house Referral: Clinical Social Work   Post Acute Care Choice: Skilled Nursing Facility Living arrangements for the past 2 months: Single Family Home                                       Social Determinants of Health (SDOH) Interventions    Readmission Risk Interventions Readmission Risk Prevention Plan 12/11/2018  Post Dischage Appt Complete  Medication Screening Complete  Transportation Screening Complete  Some recent data might be hidden

## 2020-01-22 NOTE — Progress Notes (Signed)
Physical Therapy Treatment Patient Details Name: Jerome [name]. MRN: [id number] DOB: Mar 26, 1935 Today's Date: 01/22/2020    History of Present Illness Pt is 85 y.o. male s/p R THA.  PMH includes: HTN, CAD, CHF, renal disease, arthritis, anxiety, CKD, and depression.    PT Comments    Pt upright in chair upon arrival to the room.  Pt agreeable to therapy however noted discomfort in RLE.  Pt reports that his LLE went numb earlier from sitting in the recliner and had to allow it to rest off the side and it was fine afterwards.  Pt needed much more (maxA) assistance with transfer to the edge of the recliner prior to standing.  Once pt had feet underneath him, he was able to standing with continued maxA to remain upright.  Pt placed large amount of weight through UEs onto FWW for support.  PT needed to stabilize FWW to prevent tipping.  Pt notes that the recliner arms are much lower than his wheelchair arms and that prevents him from standing easier.  Pt then able to ambulate to the bed where he transitioned into supine.  Pt with labored breathing after ambulation, and SpO2 monitored to be >90%.  Pt then left in supine position and left with all needs within reach.  Pt will benefit from PT services in a SNF setting upon discharge to safely address deficits listed in patient problem list for decreased caregiver assistance and eventual return to PLOF.   Follow Up Recommendations  SNF     Equipment Recommendations  Other (comment)    Recommendations for Other Services       Precautions / Restrictions Precautions Precautions: Posterior Hip Precaution Booklet Issued: Yes (comment) Precaution Comments: Pt educated on importance of keeping precautions. Restrictions Weight Bearing Restrictions: Yes RLE Weight Bearing: Weight bearing as tolerated    Mobility  Bed Mobility Overal bed mobility: Needs Assistance Bed Mobility: Sit to Supine       Sit to supine: Mod assist   General bed  mobility comments: Pt requires assistance with LEs bringing them up on the bed.  Transfers Overall transfer level: Needs assistance Equipment used: Rolling walker (2 wheeled)   Sit to Stand: Max assist         General transfer comment: Pt required maxA for getting pt to edge of seat.  Upon standing heavy use of UE through FWW with PT supporting FWW.  Pt educated on pushing through the bed, but pt denies ability to do so because the "bed is too soft".  Elevated bed also required for pt to stand.  Ambulation/Gait Ambulation/Gait assistance: Mod assist Gait Distance (Feet): 4 Feet Assistive device: Rolling walker (2 wheeled) Gait Pattern/deviations: Step-to pattern;Decreased step length - right;Decreased step length - left;Decreased stance time - right;Decreased stride length Gait velocity: decreased   General Gait Details: Pt very deliberate with movements, and uses heavy UE support to push through FWW.   Stairs             Wheelchair Mobility    Modified Rankin (Stroke Patients Only)       Balance Overall balance assessment: Needs assistance Sitting-balance support: Bilateral upper extremity supported;Feet supported Sitting balance-Leahy Scale: Poor     Standing balance support: Bilateral upper extremity supported Standing balance-Leahy Scale: Poor Standing balance comment: Pt require heavy use of UE to remain upright.  Cognition Arousal/Alertness: Awake/alert Behavior During Therapy: WFL for tasks assessed/performed Overall Cognitive Status: No family/caregiver present to determine baseline cognitive functioning                                 General Comments: poor recall of precautions      Exercises Total Joint Exercises Ankle Circles/Pumps: AROM;Strengthening;Both;10 reps;Supine Quad Sets: AROM;Strengthening;Both;10 reps;Supine Gluteal Sets: AROM;Strengthening;Both;10 reps;Supine Heel Slides:  AROM;Strengthening;Both;10 reps;Seated Long Arc Quad: AROM;Strengthening;Both;10 reps;Seated Marching in Standing: AROM;Strengthening;Both;5 reps;Standing Other Exercises Other Exercises: Pt with continued education on posterior precautions for safety and to allow healing of the hip. Other Exercises: Pt educated in posterior THPs and how to maintain during ADL and ADL mobility; handout provided to support recall and carryover; will benefit from additional training to maximize recall and carryover    General Comments        Pertinent Vitals/Pain Pain Assessment: 0-10 Pain Score: 6  Pain Location: R hip at rest Pain Descriptors / Indicators: Aching;Discomfort Pain Intervention(s): Limited activity within patient's tolerance;Monitored during session;Premedicated before session;Repositioned    Home Living Family/patient expects to be discharged to:: Private residence Living Arrangements: Children Available Help at Discharge: Family Type of Home: House Home Access: Ramped entrance   Home Layout: One level Home Equipment: Environmental consultant - 2 wheels;Cane - single point;Wheelchair - manual      Prior Function Level of Independence: Needs assistance  Gait / Transfers Assistance Needed: Pt stated that he transfers out of his hospital bed at home and ambulates household distances with RW. Has WC for community ambulation. Needs physical assistance to transfer off commode at home ADL's / Homemaking Assistance Needed: sons performs IADLs, needs assistance wiping after using the bathroom from his son, PCA comes 3x/wk to assist with bathing. Son provides Max A for UB and LB dressing tasks Comments: Pt denies falls in past 12 months   PT Goals (current goals can now be found in the care plan section) Acute Rehab PT Goals Patient Stated Goal: To get stronger. PT Goal Formulation: With patient Time For Goal Achievement: 02/04/20 Potential to Achieve Goals: Good Progress towards PT goals: Progressing  toward goals    Frequency    BID      PT Plan Current plan remains appropriate    Co-evaluation              AM-PAC PT "6 Clicks" Mobility   Outcome Measure  Help needed turning from your back to your side while in a flat bed without using bedrails?: A Lot Help needed moving from lying on your back to sitting on the side of a flat bed without using bedrails?: A Lot Help needed moving to and from a bed to a chair (including a wheelchair)?: A Lot Help needed standing up from a chair using your arms (e.g., wheelchair or bedside chair)?: A Lot Help needed to walk in hospital room?: A Lot Help needed climbing 3-5 steps with a railing? : Total 6 Click Score: 11    End of Session Equipment Utilized During Treatment: Gait belt Activity Tolerance: Patient limited by pain;Patient limited by fatigue Patient left: with call bell/phone within reach;in bed;with bed alarm set;with SCD's reapplied Nurse Communication: Precautions PT Visit Diagnosis: Unsteadiness on feet (R26.81);Other abnormalities of gait and mobility (R26.89);Muscle weakness (generalized) (M62.81);History of falling (Z91.81);Difficulty in walking, not elsewhere classified (R26.2);Pain Pain - Right/Left: Right Pain - part of body: Hip     Time: 3570-1779 PT  Time Calculation (min) (ACUTE ONLY): 34 min  Charges:  $Gait Training: 8-22 mins $Therapeutic Exercise: 8-22 mins                      Nolon Bussing, PT, DPT 01/22/20, 3:08 PM

## 2020-01-22 NOTE — Progress Notes (Addendum)
  Subjective: 1 Day Post-Op Procedure(s) (LRB): TOTAL HIP ARTHROPLASTY (Right) Patient reports pain as well-controlled.  He states the hip already feels better than it did prior to surgery. Patient is well, and has had no acute complaints or problems Plan is to go Skilled nursing facility after hospital stay. Negative for chest pain and shortness of breath Fever: no Gastrointestinal: negative for nausea and vomiting.   Patient has not had a bowel movement.  Objective: Vital signs in last 24 hours: Temp:  [96.9 F (36.1 C)-98.1 F (36.7 C)] 98 F (36.7 C) (08/05 0720) Pulse Rate:  [52-71] 58 (08/05 0720) Resp:  [16-24] 18 (08/05 0720) BP: (121-151)/(46-76) 121/49 (08/05 0720) SpO2:  [96 %-100 %] 97 % (08/05 0720)  Intake/Output from previous day:  Intake/Output Summary (Last 24 hours) at 01/22/2020 0849 Last data filed at 01/22/2020 0615 Gross per 24 hour  Intake 1560.5 ml  Output 1490 ml  Net 70.5 ml    Intake/Output this shift: No intake/output data recorded.  Labs: No results for input(s): HGB in the last 72 hours. No results for input(s): WBC, RBC, HCT, PLT in the last 72 hours. No results for input(s): NA, K, CL, CO2, BUN, CREATININE, GLUCOSE, CALCIUM in the last 72 hours. Recent Labs    01/19/20 1517  INR 1.0     EXAM General - Patient is Alert, Appropriate and Oriented Extremity - decreased sensation noted over lateral aspect of lower leg, otherwise Neurovascular intact Dorsiflexion/Plantar flexion intact Compartment soft Dressing/Incision -clean, dry, mild sanguinous drainage noted at midpoint of dressing Motor Function - intact, moving foot and toes well on exam.  Cardiovascular- Regular rate and rhythm, no murmurs/rubs/gallops Respiratory- Lungs clear to auscultation bilaterally Gastrointestinal- soft, nontender and active bowel sounds   Assessment/Plan: 1 Day Post-Op Procedure(s) (LRB): TOTAL HIP ARTHROPLASTY (Right) Active Problems:   Hx of total  hip arthroplasty, right  Estimated body mass index is 32.85 kg/m as calculated from the following:   Height as of this encounter: 5\' 8"  (1.727 m).   Weight as of this encounter: 98 kg. Advance diet Up with therapy Discharge to SNF pending placement.   Nursing notified that patient's R foot pump is not working.   DVT Prophylaxis - Lovenox, Ted hose and foot pumps Weight-Bearing as tolerated to right leg  , PA-C [facility] 01/22/2020, 8:49 AM

## 2020-01-22 NOTE — TOC Progression Note (Addendum)
Transition of Care [facility]) - Progression Note    Patient Details  Name: Jerome [name]. MRN: [id number] Date of Birth: 10-23-1934  Transition of [facility]) CM/SW Contact  Harleigh Civello, Lemar Livings, LCSW Phone Number: 01/22/2020, 9:14 AM  Clinical Narrative:   Spoke with pt who has been seen by PT and recommended SNF. Have completed FL2 and sent to [facility] and has been there before. Pt has not had his vaccinations for COVID. Will need to stay here for three days for Medicare to cover. Await response from [facility]  11:22 am Pt has been accepted at [facility] will need 3-day length of stay will also need COVID test prior to transfer.  Expected Discharge Plan: Skilled Nursing Facility (Depends upon PT recommendation) Barriers to Discharge: No Barriers Identified  Expected Discharge Plan and Services Expected Discharge Plan: Skilled Nursing Facility (Depends upon PT recommendation) In-house Referral: Clinical Social Work   Post Acute Care Choice: Skilled Nursing Facility Living arrangements for the past 2 months: Single Family Home                                       Social Determinants of Health (SDOH) Interventions    Readmission Risk Interventions Readmission Risk Prevention Plan 12/11/2018  Post Dischage Appt Complete  Medication Screening Complete  Transportation Screening Complete  Some recent data might be hidden

## 2020-01-22 NOTE — Anesthesia Postprocedure Evaluation (Signed)
Anesthesia Post Note  Patient: Jerome [name].  Procedure(s) Performed: TOTAL HIP ARTHROPLASTY (Right Hip)  Patient location during evaluation: Nursing Unit Anesthesia Type: Spinal Level of consciousness: oriented and awake and alert Pain management: pain level controlled Vital Signs Assessment: post-procedure vital signs reviewed and stable Respiratory status: spontaneous breathing and respiratory function stable Cardiovascular status: blood pressure returned to baseline and stable Postop Assessment: no headache, no backache, no apparent nausea or vomiting and patient able to bend at knees Anesthetic complications: no   No complications documented.   Last Vitals:  Vitals:   01/22/20 0249 01/22/20 0720  BP: (!) 127/46 (!) 121/49  Pulse: 62 (!) 58  Resp: 16 18  Temp: 36.7 C 36.7 C  SpO2: 97% 97%    Last Pain:  Vitals:   01/22/20 0720  TempSrc: Oral  PainSc:                  Rosanne Gutting

## 2020-01-22 NOTE — Evaluation (Signed)
Occupational Therapy Evaluation Patient Details Name: Jerome [name]. MRN: [id number] DOB: 09-Apr-1935 Today's Date: 01/22/2020    History of Present Illness Pt is 85 y.o. male s/p R THA.  PMH includes: HTN, CAD, CHF, renal disease, arthritis, anxiety, CKD, and depression.   Clinical Impression   Pt seen for OT evaluation this date, POD#1 from above surgery. Prior to surgery, pt required significant assist for bathing, dressing, grooming, toileting, and IADL tasks. Per pt, son assisted with MAX A pericare after toileting, ADL transfers from w/c or hospital bed, MAX A for dressing, and MAX A for med mgt, meal prep, and housekeeping tasks. Per pt, a home health aide was assisting 3 days/wk for sponge bathing. Pt is eager to return to PLOF with less pain and improved safety and independence. Pt currently requires MAX A for dressing and bathing while in seated position due to pain and limited AROM of R hip. Pt able to recall 1/3 posterior total hip precautions at start of session and unable to verbalize how to implement during ADL and mobility. Pt instructed in posterior total hip precautions and how to implement, self care skills, falls prevention strategies, home/routines modifications, and DME/AE for LB bathing and dressing tasks. At end of session, pt able to recall 2/3 posterior total hip precautions. Pt would benefit from additional instruction in self care skills and techniques to help maintain precautions with or without assistive devices to support recall and carryover prior to discharge. PT in room at end of session to continue therapy. Recommend SNF upon discharge.      Follow Up Recommendations  SNF    Equipment Recommendations  Other (comment);3 in 1 bedside commode (hip kit at next venue of care)    Recommendations for Other Services       Precautions / Restrictions Precautions Precautions: Posterior Hip Precaution Booklet Issued: Yes (comment) Precaution Comments: Pt able to  recall 1/3 at start of session, extensive education provided; able to recall 2/3 at end of session Restrictions Weight Bearing Restrictions: Yes RLE Weight Bearing: Weight bearing as tolerated      Mobility Bed Mobility Overal bed mobility: Needs Assistance Bed Mobility: Supine to Sit     Supine to sit: Mod assist     General bed mobility comments: deferred, up in recliner at start and end of session  Transfers Overall transfer level: Needs assistance Equipment used: Rolling walker (2 wheeled) Transfers: Sit to/from Stand Sit to Stand: Mod assist         General transfer comment: Heavy use of UE through FWW with PT supporting FWW.  Pt educated on pushing through the bed, but pt denies ability to do so because the "bed is too soft".  Elevated bed also required for pt to stand.    Balance Overall balance assessment: Needs assistance Sitting-balance support: Bilateral upper extremity supported;Feet supported Sitting balance-Leahy Scale: Poor     Standing balance support: Bilateral upper extremity supported Standing balance-Leahy Scale: Poor Standing balance comment: Pt require heavy use of UE to remain upright.                           ADL either performed or assessed with clinical judgement   ADL Overall ADL's : Needs assistance/impaired  General ADL Comments: Pt currently requires Max A for UB ADL 2/2 impaired strength and shoulder ROM, Max A for LB ADL 2/2 impaired strength/ROM, Mod A for ADL transfers with RW     Vision Baseline Vision/History: Wears glasses Wears Glasses: Reading only (doesn't have with him) Patient Visual Report: No change from baseline       Perception     Praxis      Pertinent Vitals/Pain Pain Assessment: 0-10 Pain Score: 6  Faces Pain Scale: Hurts little more Pain Location: R hip at rest Pain Descriptors / Indicators: Aching;Discomfort Pain Intervention(s): Limited  activity within patient's tolerance;Monitored during session;Premedicated before session;Repositioned     Hand Dominance Right   Extremity/Trunk Assessment Upper Extremity Assessment Upper Extremity Assessment: Generalized weakness (<25% shoulder flexion ROM 2/2 arthritis/pain, weak otherwise, 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, had sx on L carpal tunnel but still experiences PRN impaired sensation in hands 2/2 carpal tunnel)   Lower Extremity Assessment Lower Extremity Assessment: Generalized weakness;RLE deficits/detail RLE Deficits / Details: expected post-op strength/ROM deficits, mild impairments in knee extension (chronic), arthritic changes to previous R TKA       Communication Communication Communication: No difficulties   Cognition Arousal/Alertness: Awake/alert Behavior During Therapy: WFL for tasks assessed/performed Overall Cognitive Status: No family/caregiver present to determine baseline cognitive functioning                                 General Comments: poor recall of precautions   General Comments       Exercises Total Joint Exercises Ankle Circles/Pumps: AROM;Strengthening;Both;10 reps;Supine Quad Sets: AROM;Strengthening;Both;10 reps;Supine Gluteal Sets: AROM;Strengthening;Both;10 reps;Supine Heel Slides: AROM;Strengthening;Both;10 reps;Seated Long Arc Quad: AROM;Strengthening;Both;10 reps;Seated Marching in Standing: AROM;Strengthening;Both;5 reps;Standing Other Exercises Other Exercises: Pt with continued education on posterior precautions for safety and to allow healing of the hip. Other Exercises: Pt educated in posterior THPs and how to maintain during ADL and ADL mobility; handout provided to support recall and carryover; will benefit from additional training to maximize recall and carryover   Shoulder Instructions      Home Living Family/patient expects to be discharged to:: Private residence Living Arrangements: Children Available Help at  Discharge: Family Type of Home: [place]: One level     Bathroom Shower/Tub:  (does not use tub/shower)   Bathroom Toilet: [place]: Environmental consultant - 2 wheels;Cane - single point;Wheelchair - manual          Prior Functioning/Environment Level of Independence: Needs assistance  Gait / Transfers Assistance Needed: Pt stated that he transfers out of his hospital bed at home and ambulates household distances with RW. Has WC for community ambulation. Needs physical assistance to transfer off commode at home ADL's / Homemaking Assistance Needed: sons performs IADLs, needs assistance wiping after using the bathroom from his son, PCA comes 3x/wk to assist with bathing. Son provides Max A for UB and LB dressing tasks   Comments: Pt denies falls in past 12 months        OT Problem List: Decreased strength;Pain;Decreased range of motion;Decreased knowledge of precautions;Impaired UE functional use;Impaired balance (sitting and/or standing);Decreased knowledge of use of DME or AE;Decreased activity tolerance      OT Treatment/Interventions: Self-care/ADL training;Therapeutic exercise;Therapeutic activities;DME and/or AE instruction;Patient/family education;Balance training    OT Goals(Current goals can be found in the care plan section) Acute Rehab OT Goals Patient Stated  Goal: "get stronger and be able to walk like I used to" OT Goal Formulation: With patient Time For Goal Achievement: 02/05/20 Potential to Achieve Goals: Good ADL Goals Pt Will Perform Lower Body Dressing: with mod assist;sit to/from stand;with adaptive equipment Pt Will Transfer to Toilet: with min assist;bedside commode;ambulating (LRAD for amb, maintaining posterior THPs with VC for recall/technique) Additional ADL Goal #1: Pt will independently recall 3/3 posterior THPs and how to maintain during ADL and ADL mobility to maximize safety during  recovery. Additional ADL Goal #2: Pt will independently instruct family/caregiver in compression stocking mgt.  OT Frequency: Min 2X/week   Barriers to D/C:            Co-evaluation              AM-PAC OT "6 Clicks" Daily Activity     Outcome Measure Help from another person eating meals?: None Help from another person taking care of personal grooming?: A Little Help from another person toileting, which includes using toliet, bedpan, or urinal?: A Lot Help from another person bathing (including washing, rinsing, drying)?: A Lot Help from another person to put on and taking off regular upper body clothing?: A Lot Help from another person to put on and taking off regular lower body clothing?: A Lot 6 Click Score: 15   End of Session    Activity Tolerance: Patient tolerated treatment well Patient left: in chair;with call bell/phone within reach;with chair alarm set;Other (comment) (PT in room for session)  OT Visit Diagnosis: Other abnormalities of gait and mobility (R26.89);Muscle weakness (generalized) (M62.81);Pain Pain - Right/Left: Right Pain - part of body: Hip;Knee                Time: 6286-3817 OT Time Calculation (min): 18 min Charges:  OT General Charges $OT Visit: 1 Visit OT Evaluation $OT Eval Moderate Complexity: 1 Mod OT Treatments $Self Care/Home Management : 8-22 mins  Jeni Salles, MPH, MS, OTR/L ascom [phone] 01/22/20, 2:07 PM

## 2020-01-23 LAB — SURGICAL PATHOLOGY

## 2020-01-23 LAB — SARS CORONAVIRUS 2 (TAT 6-24 HRS): SARS Coronavirus 2: NEGATIVE

## 2020-01-23 MED ORDER — TRAMADOL HCL 50 MG PO TABS: 50.0000 mg | ORAL_TABLET | ORAL | 0 refills | Status: DC | PRN

## 2020-01-23 MED ORDER — CELECOXIB 200 MG PO CAPS: 200.0000 mg | ORAL_CAPSULE | Freq: Two times a day (BID) | ORAL | 0 refills | Status: DC

## 2020-01-23 MED ORDER — ENOXAPARIN SODIUM 40 MG/0.4ML ~~LOC~~ SOLN
40.0000 mg | SUBCUTANEOUS | 0 refills | Status: DC
Start: 2020-01-23 — End: 2020-01-24

## 2020-01-23 MED ORDER — OXYCODONE HCL 5 MG PO TABS: 5.0000 mg | ORAL_TABLET | ORAL | 0 refills | Status: DC | PRN

## 2020-01-23 NOTE — Progress Notes (Signed)
Physical Therapy Treatment Patient Details Name: Jerome [name]. MRN: [id number] DOB: 07-23-1934 Today's Date: 01/23/2020    History of Present Illness Pt is 85 y.o. male s/p R THA.  PMH includes: HTN, CAD, CHF, renal disease, arthritis, anxiety, CKD, and depression.    PT Comments    Pt received supine in bed upon arrival to room and agreeable to therapy.  Pt unwilling to get in chair due to height of arms and it being much more difficult to rise from than bed.  Pt able to transfer to sitting EOB with modA for hand held assistance for coming upright.  Pt then able to perform sit <> stand x5, with a short walk (5 ft) to the wall and back, resting between each bout.  Pt became fatigued at last of the sit <> stand so pt transitioned back to bed in supine position with modA.  Pt will continue to benefit from PT services in a SNF setting upon discharge to safely address deficits listed in patient problem list for decreased caregiver assistance and eventual return to PLOF.    Follow Up Recommendations  SNF     Equipment Recommendations  Other (comment)    Recommendations for Other Services       Precautions / Restrictions Precautions Precautions: Posterior Hip Precaution Booklet Issued: Yes (comment) Precaution Comments: Pt educated on importance of keeping precautions. Restrictions Weight Bearing Restrictions: Yes RLE Weight Bearing: Weight bearing as tolerated    Mobility  Bed Mobility Overal bed mobility: Needs Assistance Bed Mobility: Sit to Supine     Supine to sit: Mod assist Sit to supine: Mod assist   General bed mobility comments: Pt able to transfer to side of bed with modA from therapist with hand held assistance.  Transfers Overall transfer level: Needs assistance Equipment used: Rolling walker (2 wheeled) Transfers: Sit to/from Stand Sit to Stand: Mod assist         General transfer comment: Required elevated surface to push up from and verbal cuing  for hand placement.  Ambulation/Gait Ambulation/Gait assistance: Mod assist Gait Distance (Feet): 25 Feet Assistive device: Rolling walker (2 wheeled) Gait Pattern/deviations: Step-to pattern;Decreased step length - right;Decreased step length - left;Decreased stance time - right;Decreased stride length Gait velocity: decreased   General Gait Details: Pt walked forward/backward 5 ft x 5 with seated rest breaks in between.   Stairs             Wheelchair Mobility    Modified Rankin (Stroke Patients Only)       Balance Overall balance assessment: Needs assistance Sitting-balance support: Bilateral upper extremity supported;Feet supported Sitting balance-Leahy Scale: Poor Sitting balance - Comments: not assessed due to pain.   Standing balance support: Bilateral upper extremity supported Standing balance-Leahy Scale: Poor Standing balance comment: Pt require heavy use of UE to remain upright.                            Cognition Arousal/Alertness: Awake/alert Behavior During Therapy: WFL for tasks assessed/performed Overall Cognitive Status: No family/caregiver present to determine baseline cognitive functioning                                        Exercises Other Exercises Other Exercises: Pt performed STS x5, then walked forward/backwards to wall (5 feet) in between bouts.    General Comments  Pertinent Vitals/Pain Pain Assessment: Faces Faces Pain Scale: Hurts whole lot Pain Location: R hip at rest Pain Descriptors / Indicators: Aching;Discomfort Pain Intervention(s): Limited activity within patient's tolerance;Monitored during session;Premedicated before session    Home Living                      Prior Function            PT Goals (current goals can now be found in the care plan section) Acute Rehab PT Goals Patient Stated Goal: To get stronger. PT Goal Formulation: With patient Time For Goal  Achievement: 02/04/20 Potential to Achieve Goals: Good Progress towards PT goals: Progressing toward goals    Frequency    BID      PT Plan Current plan remains appropriate    Co-evaluation              AM-PAC PT "6 Clicks" Mobility   Outcome Measure  Help needed turning from your back to your side while in a flat bed without using bedrails?: A Lot Help needed moving from lying on your back to sitting on the side of a flat bed without using bedrails?: A Lot Help needed moving to and from a bed to a chair (including a wheelchair)?: A Lot Help needed standing up from a chair using your arms (e.g., wheelchair or bedside chair)?: A Lot Help needed to walk in hospital room?: A Lot Help needed climbing 3-5 steps with a railing? : Total 6 Click Score: 11    End of Session Equipment Utilized During Treatment: Gait belt Activity Tolerance: Patient limited by pain;Patient limited by fatigue Patient left: in bed;with call bell/phone within reach;with bed alarm set Nurse Communication: Precautions PT Visit Diagnosis: Unsteadiness on feet (R26.81);Other abnormalities of gait and mobility (R26.89);Muscle weakness (generalized) (M62.81);History of falling (Z91.81);Difficulty in walking, not elsewhere classified (R26.2);Pain Pain - Right/Left: Right Pain - part of body: Hip     Time: 8099-8338 PT Time Calculation (min) (ACUTE ONLY): 29 min  Charges:  $Gait Training: 8-22 mins $Therapeutic Exercise: 8-22 mins                     Nolon Bussing, PT, DPT 01/23/20, 3:38 PM

## 2020-01-23 NOTE — Progress Notes (Signed)
  Subjective: 2 Days Post-Op Procedure(s) (LRB): TOTAL HIP ARTHROPLASTY (Right) Patient reports pain as moderate.   Patient is well, and has had no acute complaints or problems Plan is to go Skilled nursing facility after hospital stay. Negative for chest pain and shortness of breath Fever: no Gastrointestinal: negative for nausea and vomiting.   Patient has had a bowel movement.  Objective: Vital signs in last 24 hours: Temp:  [98 F (36.7 C)-98.2 F (36.8 C)] 98.1 F (36.7 C) (08/06 0800) Pulse Rate:  [65-69] 69 (08/06 0800) Resp:  [18] 18 (08/06 0800) BP: (109-139)/(43-61) 139/61 (08/06 0800) SpO2:  [97 %-98 %] 98 % (08/06 0800)  Intake/Output from previous day:  Intake/Output Summary (Last 24 hours) at 01/23/2020 0914 Last data filed at 01/23/2020 0531 Gross per 24 hour  Intake 480 ml  Output 480 ml  Net 0 ml    Intake/Output this shift: No intake/output data recorded.  Labs: No results for input(s): HGB in the last 72 hours. No results for input(s): WBC, RBC, HCT, PLT in the last 72 hours. No results for input(s): NA, K, CL, CO2, BUN, CREATININE, GLUCOSE, CALCIUM in the last 72 hours. No results for input(s): LABPT, INR in the last 72 hours.   EXAM General - Patient is Alert, Appropriate and Oriented Extremity - Neurovascular intact Dorsiflexion/Plantar flexion intact Compartment soft Dressing/Incision -Hemovac in place. , moderate sanguinous drainage noted over steinman pin site Motor Function - intact, moving foot and toes well on exam.  Cardiovascular- Regular rate and rhythm, no murmurs/rubs/gallops Respiratory- Lungs clear to auscultation bilaterally Gastrointestinal- soft and active bowel sounds, slightly tender to palpation in RLQ   Assessment/Plan: 2 Days Post-Op Procedure(s) (LRB): TOTAL HIP ARTHROPLASTY (Right) Active Problems:   Hx of total hip arthroplasty, right  Estimated body mass index is 32.85 kg/m as calculated from the following:    Height as of this encounter: 5\' 8"  (1.727 m).   Weight as of this encounter: 98 kg. Advance diet Up with therapy Discharge to SNF pending insurance approval.  Hemovac removed. Compression dressing placed. Fresh honeycomb applied.  DVT Prophylaxis - Lovenox, Ted hose and foot pumps Weight-Bearing as tolerated to right leg  , PA-C [facility] 01/23/2020, 9:14 AM

## 2020-01-23 NOTE — Progress Notes (Signed)
Occupational Therapy Treatment Patient Details Name: Jerome [name]. MRN: [id number] DOB: [birth year] Today's Date: 01/23/2020    History of present illness Pt is 85 y.o. male s/p R THA.  PMH includes: HTN, CAD, CHF, renal disease, arthritis, anxiety, CKD, and depression.   OT comments  Pt seen for OT tx this date. Pt able to recall 2/3 posterior THPs at start of session with minimal cueing. Further instruction provided in precautions and how to maintain during ADL/mobility with and without AE/DME. Pt able to recall 3/3 with cues at end of session. Functional ADL mobility deferred 2/2 significant pain, however, pt able to push himself up in bed with HOB lowered using L foot on bed and bilat hands on bed rail. Pt continues to benefit from skilled OT services. Continues to be most appropriate for SNF for continued rehab.    Follow Up Recommendations  SNF    Equipment Recommendations  Other (comment);3 in 1 bedside commode (hip kit at next venue of care)    Recommendations for Other Services      Precautions / Restrictions Precautions Precautions: Posterior Hip Precaution Comments: Pt able to recall 2/3 at start of session, additional instruction provided, able to recall 3/3 at end of session Restrictions Weight Bearing Restrictions: Yes RLE Weight Bearing: Weight bearing as tolerated       Mobility Bed Mobility               General bed mobility comments: deferred 2/2 pain, pt just took pain medication  Transfers                 General transfer comment: deferred 2/2 pain, pt just took pain medication    Balance                                           ADL either performed or assessed with clinical judgement   ADL Overall ADL's : Needs assistance/impaired                                       General ADL Comments: Pt currently requires Max A for UB ADL 2/2 impaired strength and shoulder ROM, Max A for LB ADL 2/2  impaired strength/ROM, Mod A for ADL transfers with RW     Vision Baseline Vision/History: Wears glasses Wears Glasses: Reading only Patient Visual Report: No change from baseline     Perception     Praxis      Cognition Arousal/Alertness: Awake/alert Behavior During Therapy: WFL for tasks assessed/performed Overall Cognitive Status: No family/caregiver present to determine baseline cognitive functioning                                 General Comments: poor recall of precautions        Exercises Other Exercises Other Exercises: Pt further educated in posterior THPs and how to maintain during ADL and ADL mobility to maximize recall and carryover; instructed in strategies and AE/DME to utilize for ADL tasks to maintain precautions   Shoulder Instructions       General Comments      Pertinent Vitals/ Pain       Pain Assessment: 0-10 Pain Score: 8  Pain Location: R hip at rest  Pain Descriptors / Indicators: Aching;Discomfort Pain Intervention(s): Limited activity within patient's tolerance;Monitored during session;Premedicated before session  Home Living                                          Prior Functioning/Environment              Frequency  Min 2X/week        Progress Toward Goals  OT Goals(current goals can now be found in the care plan section)  Progress towards OT goals: Progressing toward goals  Acute Rehab OT Goals Patient Stated Goal: To get stronger. OT Goal Formulation: With patient Time For Goal Achievement: 02/05/20 Potential to Achieve Goals: Good  Plan Discharge plan remains appropriate;Frequency remains appropriate    Co-evaluation                 AM-PAC OT "6 Clicks" Daily Activity     Outcome Measure   Help from another person eating meals?: None Help from another person taking care of personal grooming?: A Little Help from another person toileting, which includes using toliet, bedpan,  or urinal?: A Lot Help from another person bathing (including washing, rinsing, drying)?: A Lot Help from another person to put on and taking off regular upper body clothing?: A Lot Help from another person to put on and taking off regular lower body clothing?: A Lot 6 Click Score: 15    End of Session    OT Visit Diagnosis: Other abnormalities of gait and mobility (R26.89);Muscle weakness (generalized) (M62.81);Pain Pain - Right/Left: Right Pain - part of body: Hip;Knee   Activity Tolerance Patient tolerated treatment well;Patient limited by pain   Patient Left in bed;with call bell/phone within reach;with bed alarm set;with SCD's reapplied;Other (comment) (pillow between legs)   Nurse Communication          Time: 8270-7867 OT Time Calculation (min): 24 min  Charges: OT General Charges $OT Visit: 1 Visit OT Treatments $Self Care/Home Management : 23-37 mins  Jeni Salles, MPH, MS, OTR/L ascom [phone] 01/23/20, 10:29 AM

## 2020-01-23 NOTE — Care Management Important Message (Signed)
Important Message  Patient Details  Name: Jerome [name]. MRN: [id number] Date of Birth: 03-26-1935   Medicare Important Message Given:  N/A - LOS <3 / Initial given by admissions     Olegario Messier A Durell Lofaso 01/23/2020, 8:37 AM

## 2020-01-23 NOTE — TOC Progression Note (Signed)
Transition of Care [facility]) - Progression Note    Patient Details  Name: Jerome [name]. MRN: [id number] Date of Birth: 07/24/1934  Transition of Care [facility]) CM/SW Contact  Patsy Zaragoza, Lemar Livings, LCSW Phone Number: 01/23/2020, 10:04 AM  Clinical Narrative:   Pt aware of the plan for tomorrow of transfer to [facility] he is agreeable and feels it is needed before going home. Ben-PA to do DC summary and has ordered COVID test in preparation for tomorrow.     Expected Discharge Plan: Skilled Nursing Facility (Depends upon PT recommendation) Barriers to Discharge: No Barriers Identified  Expected Discharge Plan and Services Expected Discharge Plan: Skilled Nursing Facility (Depends upon PT recommendation) In-house Referral: Clinical Social Work   Post Acute Care Choice: Skilled Nursing Facility Living arrangements for the past 2 months: Single Family Home                                       Social Determinants of Health (SDOH) Interventions    Readmission Risk Interventions Readmission Risk Prevention Plan 12/11/2018  Post Dischage Appt Complete  Medication Screening Complete  Transportation Screening Complete  Some recent data might be hidden

## 2020-01-23 NOTE — Progress Notes (Signed)
Physical Therapy Treatment Patient Details Name: Jerome [name]. MRN: [id number] DOB: 02/17/1935 Today's Date: 01/23/2020    History of Present Illness Pt is 85 y.o. male s/p R THA.  PMH includes: HTN, CAD, CHF, renal disease, arthritis, anxiety, CKD, and depression.    PT Comments    Pt supine in bed upon arrival and too painful to participate in sitting/standing/walking.  Pt agreeable to doing exercises in bed to increase strength of LE's.  Pt able to perform with greater ability compared to previous sessions.  Pt left in bed with all needs within reach.  PT will attempt to get patient out of bed during PM session.     Follow Up Recommendations  SNF     Equipment Recommendations  Other (comment)    Recommendations for Other Services       Precautions / Restrictions Precautions Precautions: Posterior Hip Precaution Booklet Issued: Yes (comment) Precaution Comments: Pt educated on importance of keeping precautions. Restrictions Weight Bearing Restrictions: Yes RLE Weight Bearing: Weight bearing as tolerated    Mobility  Bed Mobility               General bed mobility comments: Deferred tranferring due to pain.  Transfers                 General transfer comment: Deferred tranferring due to pain.  Ambulation/Gait                 Stairs             Wheelchair Mobility    Modified Rankin (Stroke Patients Only)       Balance       Sitting balance - Comments: not assessed due to pain.                                    Cognition Arousal/Alertness: Awake/alert Behavior During Therapy: WFL for tasks assessed/performed Overall Cognitive Status: No family/caregiver present to determine baseline cognitive functioning                                 General Comments: poor recall of precautions      Exercises Total Joint Exercises Ankle Circles/Pumps: AROM;Strengthening;Both;10 reps;Supine Quad  Sets: AROM;Strengthening;Both;10 reps;Supine Gluteal Sets: AROM;Strengthening;Both;10 reps;Supine Heel Slides: AROM;Strengthening;Both;10 reps;Seated Long Arc Quad: AROM;Strengthening;Both;10 reps;Seated Marching in Standing: AROM;Strengthening;Both;5 reps;Standing Other Exercises Other Exercises: Pt further educated in posterior THPs and how to maintain during ADL and ADL mobility to maximize recall and carryover; instructed in strategies and AE/DME to utilize for ADL tasks to maintain precautions    General Comments        Pertinent Vitals/Pain Pain Assessment: Faces Pain Score: 8  Faces Pain Scale: Hurts whole lot Pain Location: R hip at rest Pain Descriptors / Indicators: Aching;Discomfort Pain Intervention(s): Limited activity within patient's tolerance;Monitored during session;Premedicated before session    Home Living                      Prior Function            PT Goals (current goals can now be found in the care plan section) Acute Rehab PT Goals Patient Stated Goal: To get stronger. PT Goal Formulation: With patient Time For Goal Achievement: 02/04/20 Potential to Achieve Goals: Good Progress towards PT goals: Progressing toward goals  Frequency    BID      PT Plan Current plan remains appropriate    Co-evaluation              AM-PAC PT "6 Clicks" Mobility   Outcome Measure  Help needed turning from your back to your side while in a flat bed without using bedrails?: A Lot Help needed moving from lying on your back to sitting on the side of a flat bed without using bedrails?: A Lot Help needed moving to and from a bed to a chair (including a wheelchair)?: A Lot Help needed standing up from a chair using your arms (e.g., wheelchair or bedside chair)?: A Lot Help needed to walk in hospital room?: A Lot Help needed climbing 3-5 steps with a railing? : Total 6 Click Score: 11    End of Session   Activity Tolerance: Patient limited by  pain;Patient limited by fatigue Patient left: in bed;with call bell/phone within reach;with bed alarm set Nurse Communication: Precautions PT Visit Diagnosis: Unsteadiness on feet (R26.81);Other abnormalities of gait and mobility (R26.89);Muscle weakness (generalized) (M62.81);History of falling (Z91.81);Difficulty in walking, not elsewhere classified (R26.2);Pain Pain - Right/Left: Right Pain - part of body: Hip     Time: 1030-1039 PT Time Calculation (min) (ACUTE ONLY): 9 min  Charges:  $Therapeutic Exercise: 8-22 mins                      Nolon Bussing, PT, DPT 01/23/20, 2:03 PM

## 2020-01-23 NOTE — TOC Progression Note (Signed)
Transition of Care [facility]) - Progression Note    Patient Details  Name: Jerome [name]. MRN: [id number] Date of Birth: [birth year]  Transition of Care [facility]) CM/SW Contact  Atanacio Melnyk, Lemar Livings, LCSW Phone Number: 01/23/2020, 2:01 PM  Clinical Narrative:  DC summary sent over to [facility] in anticipation of DC tomorrow, so they can get his medications for the weekend. COVID test not back yet. Will leave weekend coverage information on transfer tomorrow.    Expected Discharge Plan: Skilled Nursing Facility (Depends upon PT recommendation) Barriers to Discharge: No Barriers Identified  Expected Discharge Plan and Services Expected Discharge Plan: Skilled Nursing Facility (Depends upon PT recommendation) In-house Referral: Clinical Social Work   Post Acute Care Choice: Skilled Nursing Facility Living arrangements for the past 2 months: Single Family Home                                       Social Determinants of Health (SDOH) Interventions    Readmission Risk Interventions Readmission Risk Prevention Plan 12/11/2018  Post Dischage Appt Complete  Medication Screening Complete  Transportation Screening Complete  Some recent data might be hidden

## 2020-01-23 NOTE — Discharge Summary (Addendum)
Physician Discharge Summary  Patient ID: Jerome [name]. MRN: [id number] DOB/AGE: Nov 06, 1934 85 y.o.  Admit date: 01/21/2020 Discharge date: 01/24/20  Admission Diagnoses:  Hx of total hip arthroplasty, right [Z96.641]  Surgeries:Procedure(s): Right total hip arthroplasty  SURGEON:  Jena Gauss. M.D.  ASSISTANT: Baldwin Jamaica, PA-C (present and scrubbed throughout the case, critical for assistance with exposure, retraction, instrumentation, and closure)  ANESTHESIA: spinal  ESTIMATED BLOOD LOSS: 150 mL  FLUIDS REPLACED: 1000 mL of crystalloid  DRAINS: 2 medium drains to a Hemovac reservoir  IMPLANTS UTILIZED: DePuy 15 mm large stature AML femoral stem, 62 mm OD [employer] acetabular component, 6.5 x 30 mm cancellous screw,neutral [employer] polyethylene insert, and a 36 mm M-SPEC +1.5 mm hip ball  Discharge Diagnoses: Patient Active Problem List   Diagnosis Date Noted  . Hx of total hip arthroplasty, right 01/21/2020  . Sepsis (HCC) 03/20/2019  . Chronic systolic CHF (congestive heart failure) (HCC) 03/20/2019  . CAD (coronary artery disease) 03/20/2019  . HTN (hypertension) 03/20/2019  . Acute on chronic renal failure (HCC) 03/20/2019  . Severe sepsis (HCC) 03/20/2019  . CKD (chronic kidney disease) stage 3, GFR 30-59 ml/min 02/11/2019  . Right leg pain 12/13/2018  . Acute exacerbation of CHF (congestive heart failure) (HCC) 12/09/2018  . Benign essential hypertension 01/12/2014  . Gout 01/12/2014    Past Medical History:  Diagnosis Date  . Anxiety   . Arthritis   . CAD (coronary artery disease)   . Chronic kidney disease    stage 3  . Chronic systolic CHF (congestive heart failure) (HCC)   . Depression   . Hypertension      Transfusion: n/a   Consultants (if any):   Discharged Condition: Improved  Hospital Course: Jerome Kos. is an 85 y.o. male who was admitted 01/21/2020 with a diagnosis of right hip  osteoarthritis and went to the operating room on 01/21/2020 and underwent right total hip arthroplasty. The patient received perioperative antibiotics for prophylaxis (see below). The patient tolerated the procedure well and was transported to PACU in stable condition. After meeting PACU criteria, the patient was subsequently transferred to the Orthopaedics/Rehabilitation unit.   The patient received DVT prophylaxis in the form of early mobilization, Lovenox, Foot Pumps and TED hose. A sacral pad had been placed and heels were elevated off of the bed with rolled towels in order to protect skin integrity. Foley catheter was discontinued on postoperative day #0. Wound drains were discontinued on postoperative day #2. The surgical incision was healing well without signs of infection.  Physical therapy was initiated postoperatively for transfers, gait training, and strengthening. Occupational therapy was initiated for activities of daily living and evaluation for assisted devices. Rehabilitation goals were reviewed in detail with the patient. The patient made steady progress with physical therapy and physical therapy recommended discharge to Skilled nursing facility.   The patient achieved the preliminary goals of this hospitalization and was felt to be medically and orthopaedically appropriate for discharge.  He was given perioperative antibiotics:  Anti-infectives (From admission, onward)   Start     Dose/Rate Route Frequency Ordered Stop   01/21/20 1500  ceFAZolin (ANCEF) IVPB 2g/100 mL premix        2 g 200 mL/hr over 30 Minutes Intravenous Every 6 hours 01/21/20 1411 01/22/20 0915   01/21/20 0757  ceFAZolin (ANCEF) 2-4 GM/100ML-% IVPB       Note to Pharmacy: Jerome [name]   : cabinet override  01/21/20 0757 01/21/20 0907   01/21/20 0756  sodium chloride 0.9 % with cefoTEtan (CEFOTAN) ADS Med       Note to Pharmacy: Jerome [name]   : cabinet override      01/21/20 0756 01/21/20 1959    01/21/20 0730  ceFAZolin (ANCEF) IVPB 2g/100 mL premix        2 g 200 mL/hr over 30 Minutes Intravenous On call to O.R. 01/21/20 4401 01/21/20 0915    . Recent vital signs:  Vitals:   01/24/20 0012 01/24/20 0757  BP: (!) 120/52 125/60  Pulse: 69 63  Resp: 20 16  Temp: 99 F (37.2 C) 98.2 F (36.8 C)  SpO2: 97% 98%    Recent laboratory studies:  No results for input(s): WBC, HGB, HCT, PLT, K, CL, CO2, BUN, CREATININE, GLUCOSE, CALCIUM, LABPT, INR in the last 72 hours.  Diagnostic Studies: NM Myocar Multi W/Spect W/Wall Motion / EF  Result Date: 12/26/2019  The study is normal.  This is a low risk study.  The left ventricular ejection fraction is normal (55-65%).  Blood pressure demonstrated a normal response to exercise.  There was no ST segment deviation noted during stress.    DG Hip Port Unilat With Pelvis 1V Right  Result Date: 01/21/2020 CLINICAL DATA:  85 year old male status post right hip replacement. EXAM: DG HIP (WITH OR WITHOUT PELVIS) 1V PORT RIGHT COMPARISON:  Pelvis radiograph 03/23/2019 and earlier. FINDINGS: Supine AP and cross-table lateral views of the right hip and pelvis. Previous very severe right hip joint degeneration. Contralateral left hip moderate to severe joint space loss appears stable. New right total hip arthroplasty. Hardware appears intact with satisfactory positioning. Postoperative drain in place adjacent to the greater trochanter. No unexpected osseous changes identified. Overlying skin staples. IMPRESSION: Right total hip arthroplasty with no adverse features. Electronically Signed   By: Odessa Fleming M.D.   On: 01/21/2020 13:38   Discharge Medications:   Allergies as of 01/24/2020      Reactions   Gabapentin Other (See Comments)   hypotension      Medication List    STOP taking these medications   HYDROcodone-acetaminophen 5-325 MG tablet Commonly known as: NORCO/VICODIN     TAKE these medications   amLODipine 5 MG tablet Commonly known as:  NORVASC Take 5 mg by mouth daily.   carvedilol 6.25 MG tablet Commonly known as: COREG Take 1 tablet (6.25 mg total) by mouth 2 (two) times daily with a meal.   celecoxib 200 MG capsule Commonly known as: CELEBREX Take 1 capsule (200 mg total) by mouth 2 (two) times daily.   enoxaparin 40 MG/0.4ML injection Commonly known as: LOVENOX Inject 0.4 mLs (40 mg total) into the skin daily for 14 days.   multivitamin with minerals tablet Take 1 tablet by mouth daily.   oxyCODONE 5 MG immediate release tablet Commonly known as: Oxy IR/ROXICODONE Take 1 tablet (5 mg total) by mouth every 4 (four) hours as needed for moderate pain (pain score 4-6).   tamsulosin 0.4 MG Caps capsule Commonly known as: FLOMAX Take 0.4 mg by mouth daily at 6 PM.   traMADol 50 MG tablet Commonly known as: ULTRAM Take 1 tablet (50 mg total) by mouth every 4 (four) hours as needed for moderate pain.   vitamin B-12 1000 MCG tablet Commonly known as: CYANOCOBALAMIN Take 1,000 mcg by mouth daily.   vitamin C with rose hips 500 MG tablet Take 500 mg by mouth in the morning and at  bedtime.            Durable Medical Equipment  (From admission, onward)         Start     Ordered   01/21/20 1412  DME Walker rolling  Once       Question:  Patient needs a walker to treat with the following condition  Answer:  S/P total hip arthroplasty   01/21/20 1411   01/21/20 1412  DME Bedside commode  Once       Question:  Patient needs a bedside commode to treat with the following condition  Answer:  S/P total hip arthroplasty   01/21/20 1411         Disposition:  Patient has had a BM, plan for discharge to [facility] today.   Contact information for follow-up providers    Donato Heinz, MD On 03/04/2020.   Specialty: Orthopedic Surgery Why: at 2:45pm Contact information: [address] [facility] [place] [address] [phone]            Contact information for  after-discharge care    Destination    [facility] .   Service: Skilled Nursing Contact information: [address] [place] [address] [phone]                 J. Horris Latino, PA-C 01/24/2020, 10:07 AM

## 2020-01-24 MED ORDER — ENOXAPARIN SODIUM 40 MG/0.4ML ~~LOC~~ SOLN
40.0000 mg | SUBCUTANEOUS | 0 refills | Status: DC
Start: 2020-01-24 — End: 2022-07-03

## 2020-01-24 MED ORDER — CELECOXIB 200 MG PO CAPS: 200.0000 mg | ORAL_CAPSULE | Freq: Two times a day (BID) | ORAL | 0 refills | Status: DC

## 2020-01-24 MED ORDER — OXYCODONE HCL 5 MG PO TABS: 5.0000 mg | ORAL_TABLET | ORAL | 0 refills | Status: DC | PRN

## 2020-01-24 MED ORDER — TRAMADOL HCL 50 MG PO TABS: 50.0000 mg | ORAL_TABLET | ORAL | 0 refills | Status: DC | PRN

## 2020-01-24 NOTE — Progress Notes (Signed)
Report called to [employer] @ [facility].

## 2020-01-24 NOTE — Progress Notes (Signed)
  Subjective: 3 Days Post-Op Procedure(s) (LRB): TOTAL HIP ARTHROPLASTY (Right) Patient reports pain as mild.   Patient is well, and has had no acute complaints or problems Plan is to go to [facility] today. Negative for chest pain and shortness of breath Fever: no Gastrointestinal: negative for nausea and vomiting.   Patient has had a bowel movement.  Objective: Vital signs in last 24 hours: Temp:  [98.2 F (36.8 C)-99 F (37.2 C)] 98.2 F (36.8 C) (08/07 0757) Pulse Rate:  [63-69] 63 (08/07 0757) Resp:  [16-20] 16 (08/07 0757) BP: (120-125)/(52-60) 125/60 (08/07 0757) SpO2:  [96 %-98 %] 98 % (08/07 0757)  Intake/Output from previous day:  Intake/Output Summary (Last 24 hours) at 01/24/2020 1005 Last data filed at 01/24/2020 0808 Gross per 24 hour  Intake 480 ml  Output 150 ml  Net 330 ml    Intake/Output this shift: Total I/O In: -  Out: 100 [Urine:100]  Labs: No results for input(s): HGB in the last 72 hours. No results for input(s): WBC, RBC, HCT, PLT in the last 72 hours. No results for input(s): NA, K, CL, CO2, BUN, CREATININE, GLUCOSE, CALCIUM in the last 72 hours. No results for input(s): LABPT, INR in the last 72 hours.   EXAM General - Patient is Alert, Appropriate and Oriented Extremity - Neurovascular intact Sensation intact distally Dorsiflexion/Plantar flexion intact Compartment soft Dressing/Incision - Moderate bloody drainage to the right hip, honeycomb dressing changed today. Motor Function - intact, moving foot and toes well on exam.  Cardiovascular- Regular rate and rhythm, no murmurs/rubs/gallops Respiratory- Lungs clear to auscultation bilaterally Gastrointestinal- soft and active bowel sounds  Assessment/Plan: 3 Days Post-Op Procedure(s) (LRB): TOTAL HIP ARTHROPLASTY (Right) Active Problems:   Hx of total hip arthroplasty, right  Estimated body mass index is 32.85 kg/m as calculated from the following:   Height as of this  encounter: 5\' 8"  (1.727 m).   Weight as of this encounter: 98 kg. Advance diet Up with therapy Discharge to SNF  Fresh honeycomb dressing applied. Plan for discharge to today. Patient has had a BM.  DVT Prophylaxis - Lovenox, Ted hose and foot pumps Weight-Bearing as tolerated to right leg  J. [facility], PA-C [facility] Orthopaedic Surgery 01/24/2020, 10:05 AM

## 2020-01-24 NOTE — Progress Notes (Signed)
PT Cancellation Note  Patient Details Name: Jerome [name]. MRN: [id number] DOB: 1934-06-20   Cancelled Treatment:    Reason Eval/Treat Not Completed: Other (comment).  Pt is awake in bed and declined therapy, asking PT to let him rest before dc.  Follow up tomorrow if pt is not discharged.   Ivar Drape 01/24/2020, 2:55 PM   Samul Dada, PT MS Acute Rehab Dept. Number: [facility] [id number] and [facility] [phone]

## 2020-01-24 NOTE — Progress Notes (Signed)
EMS here to transport pt. 

## 2020-01-24 NOTE — TOC Transition Note (Signed)
Transition of Care [facility]) - CM/SW Discharge Note   Patient Details  Name: Jerome [name]. MRN: [id number] Date of Birth: 04-11-1935  Transition of Care [facility]) CM/SW Contact:  Maud Deed, LCSW Phone Number: 01/24/2020, 10:26 AM   Clinical Narrative:    Pt medically stable for discharge per MD. Pt will be transported to [facility] via EMS, and will be going to room 503, Call to report number is [phone]. CSW notified pt's son of discharge. CSW will arrange EMS transport once nursing staff is ready.    Final next level of care: Skilled Nursing Facility Barriers to Discharge: No Barriers Identified   Patient Goals and CMS Choice Patient states their goals for this hospitalization and ongoing recovery are:: I was thinking I would go to a facility fro one week then home and give my son a break CMS Medicare.gov Compare Post Acute Care list provided to:: Patient Choice offered to / list presented to : Patient  Discharge Placement              Patient chooses bed at: [facility] Patient to be transferred to facility by: EMS Name of family member notified: Onalee Hua Patient and family notified of of transfer: 01/24/20  Discharge Plan and Services In-house Referral: Clinical Social Work   Post Acute Care Choice: Skilled Nursing Facility                               Social Determinants of Health (SDOH) Interventions     Readmission Risk Interventions Readmission Risk Prevention Plan 12/11/2018  Post Dischage Appt Complete  Medication Screening Complete  Transportation Screening Complete  Some recent data might be hidden

## 2020-01-24 NOTE — Progress Notes (Signed)
Attempted to call report to [facility]

## 2020-01-24 NOTE — Progress Notes (Signed)
PT Cancellation Note  Patient Details Name: Jerome [name]. MRN: [id number] DOB: 16-Sep-1934   Cancelled Treatment:    Reason Eval/Treat Not Completed: Pain limiting ability to participate;Fatigue/lethargy limiting ability to participate.  Pt declined PT stating he is hurting and tired from being up and getting ready to leave.  May be discharged before re-attempt later.   Ivar Drape 01/24/2020, 12:24 PM   Samul Dada, PT MS Acute Rehab Dept. Number: [facility] [id number] and [facility] [phone]

## 2020-01-27 ENCOUNTER — Other Ambulatory Visit: Payer: Medicare Other | Admitting: Primary Care

## 2020-03-17 ENCOUNTER — Ambulatory Visit: Payer: Medicare Other | Admitting: Physical Therapy

## 2020-03-29 ENCOUNTER — Ambulatory Visit: Payer: Medicare Other | Attending: Orthopedic Surgery | Admitting: Physical Therapy

## 2020-03-29 ENCOUNTER — Other Ambulatory Visit: Payer: Self-pay

## 2020-03-29 ENCOUNTER — Encounter: Payer: Self-pay | Admitting: Physical Therapy

## 2020-03-29 DIAGNOSIS — R2689 Other abnormalities of gait and mobility: Secondary | ICD-10-CM | POA: Insufficient documentation

## 2020-03-29 DIAGNOSIS — M25651 Stiffness of right hip, not elsewhere classified: Secondary | ICD-10-CM

## 2020-03-29 DIAGNOSIS — M25551 Pain in right hip: Secondary | ICD-10-CM

## 2020-03-29 DIAGNOSIS — R296 Repeated falls: Secondary | ICD-10-CM | POA: Diagnosis present

## 2020-03-29 NOTE — Therapy (Signed)
[place] [facility] [address], [place], [address] Phone: [phone]   Fax:  [phone]  Physical Therapy Evaluation  Patient Details  Name: Jerome [name]. MRN: [id number] Date of Birth: [birth year] No data recorded  Encounter Date: 03/29/2020   PT End of Session - 03/30/20 0924    Visit Number 1    Number of Visits 17    Date for PT Re-Evaluation 05/28/20    Authorization - Visit Number 1    Authorization - Number of Visits 10    PT Start Time 0230    PT Stop Time 0330    PT Time Calculation (min) 60 min    Equipment Utilized During Treatment Gait belt    Activity Tolerance Patient tolerated treatment well;Patient limited by fatigue    Behavior During Therapy WFL for tasks assessed/performed           Past Medical History:  Diagnosis Date  . Anxiety   . Arthritis   . CAD (coronary artery disease)   . Chronic kidney disease    stage 3  . Chronic systolic CHF (congestive heart failure) (HCC)   . Depression   . Hypertension     Past Surgical History:  Procedure Laterality Date  . APPENDECTOMY    . HERNIA REPAIR Right    inguinal  . JOINT REPLACEMENT Right    knee  . TOTAL HIP ARTHROPLASTY Right 01/21/2020   Procedure: TOTAL HIP ARTHROPLASTY;  Surgeon: Donato Heinz, MD;  Location: [facility] ORS;  Service: Orthopedics;  Laterality: Right;    There were no vitals filed for this visit.    Subjective Assessment - 03/29/20 1435    Pertinent History Pt presents s/p R THA posterior approach 01/21/20. Completed 3 weeks of rehab at [facility] following, has not completed HHPT. Propels WC into clinic, reports he uses his WC if he is going in the community for appts, and uses RW at home. Prior to surgery he was using the WC and RW because he was not getting around well d/t hip pain, had been using a cane since R knee replacement "years ago" but would like to get back to walking without AD. Patient  reports he has the ability to drive, but has not drove since hip replacement. His son lives with him, drives him to appts, completing grocery shopping, cooking, and cleaning. Patient has an aid that comes 3x/week and bathes him sitting EOB, and dresses him. The remaining days he only wears a tshirt and underwear, is unable to fully dress without assistance. Prior to surgery hospice was coming in and bathing him as well, reports he has not been getting in his shower for a "long time" because he has a tub/shower unit and no shower seat has been able to fit in his shower. Patient is able to transfer bed <> chair, STS, and complete bed mobility modI. Pt has ramp to enter his home and no stairs inside. Reports he completes 1-2 41ft amb with RW/day and completing sitting exercises occassionally. Pt is retired and reports he has no hobbies he is able to complete anymore. "Many years ago I enjoyed hunting/fishing, and playing softball". He would like to take his own showers, but reports it has been a while since he could do so d/t balance deficits as well. Denies pain or sensation deficits of RLE at this time, some soreness of LLE. Pt denies N/V, B&B changes, unexplained weight fluctuation, saddle paresthesia, fever, night  sweats, or unrelenting night pain at this time.    Limitations Lifting;Standing;Sitting;Walking    How long can you sit comfortably? unlimited; sits most of the day    How long can you stand comfortably? >60mins    How long can you walk comfortably? >25mins    Patient Stated Goals take my own showers and walk better    Currently in Pain? Yes    Pain Score 0-No pain    Pain Location Hip    Pain Orientation Right    Pain Descriptors / Indicators Aching    Pain Type Chronic pain;Surgical pain    Pain Radiating Towards none    Pain Onset More than a month ago    Pain Frequency Intermittent                  OBJECTIVE  Mental Status Patient is oriented to person, place and time.   Recent memory is intact.  Remote memory is intact.  Attention span and concentration are intact.  Expressive speech is intact.  Patient's fund of knowledge is within normal limits for educational level.  SENSATION: Grossly intact to light touch bilateral LEs as determined by testing dermatomes L2-S2 Proprioception and hot/cold testing deferred on this date   MUSCULOSKELETAL: Tremor: None Bulk: Normal Tone: Normal No visible step-off along spinal column  Posture Lumbar lordosis: WNL Iliac crest height: equal bilaterally Lumbar lateral shift: negative Lower crossed syndrome (tight hip flexors and erector spinae; weak gluts and abs): Positive Heavy forward trunk lean, increased thoracic kyphosis and FH, bilat knee valgus, and midfoot supination with calcaneal valgus  Gait Narrow BOS, little to no foot clearance bilat, shuffle. Heavy forward trunk lean and reliance on RW.    Palpation  No pain to palpation, only with overpressure for hip and knee ext  Strength (out of 5) R/L 5/5 Hip flexion 3+/4 Hip ER 3/3 Hip IR 2-/unable Hip abduction Unable Hip adduction Unable to lie prone, noted weakness d/t inability to extend hips in standing, or complete STS without UE Hip extension 2+/2+ Knee extension 5/5 Knee flexion 5/5 Ankle dorsiflexion 5/5 Ankle plantarflexion 5 Trunk flexion 5 Trunk extension 5/5 Trunk rotation  *Indicates pain   AROM (degrees) R/L (all movements include overpressure unless otherwise stated) Hip IR (0-45): 10d bilat Hip ER (0-45): 15d bilat: Hip Flexion (0-125): R 95d L 96d Hip extension (0-15): unable to obtain neutral standing hip ext Knee ext R: 20d L 35d Knee flex R 117d L 105d *Indicates pain   PROM (degrees) PROM = AROM R/L (all movements include overpressure unless otherwise stated) Hip IR (0-45): 10d bilat Hip ER (0-45): 15d bilat: Hip Flexion (0-125): R 103d L 101d Hip extension (0-15): unable Knee ext R: 18d L 31d Knee flex R  120d L 108d  Repeated Movements No centralization or peripheralization of symptoms with repeated lumbar extension or flexion.    Muscle Length Hamstrings: Shortened bilat LFY:BOFBPZ to obtain test position [place]: unable to obtain test position [place]: Neg RLE, unable LLE    SPECIAL TESTS Hip: FABER (SN 81): Positive bilat FADIR (SN 94): Positive bilat Hip scour (SN 50): Positive LLE only    Functional Tasks Sitting balance feet unsupported = good Standing static balance = good Standing without support 30sec; eyes closed 30sec  Narrow BOS 30sec; eyes closed 30sec with  increased sway and hip/ankle strategy 21ft 0.24m/s Bed mobility: minA to roll to L side, unable to lay on R side. Unable to roll into prone, reports  he uses bed rails to roll onto L side in hospital bed at home; unable to bridge, can complete minimal scooting, requires minA to scoot  STS modI with reliance on UEs Supine <> sit modI with increased                  Objective measurements completed on examination: See above findings.               PT Education - 03/29/20 1454    Education Details Patient was educated on diagnosis, anatomy and pathology involved, prognosis, role of PT, and was given an HEP, demonstrating exercise with proper form following verbal and tactile cues, and was given a paper hand out to continue exercise at home. Pt was educated on and agreed to plan of care.    Person(s) Educated Patient    Methods Explanation;Demonstration;Tactile cues;Verbal cues;Handout    Comprehension Verbalized understanding;Returned demonstration;Verbal cues required;Tactile cues required            PT Short Term Goals - 03/30/20 1047      PT SHORT TERM GOAL #1   Title Pt will be independent with HEP in order to improve strength and decrease back pain in order to improve pain-free function at home and work.    Baseline 03/29/20 HEP given    Time 4    Period Weeks     Status New      PT SHORT TERM GOAL #2   Title Pt will increase to at least 0.18m/s in order to demonstrate clinically significant safe household and limited community ambulation    Baseline 03/30/20 0.87m/s             PT Long Term Goals - 03/30/20 1050      PT LONG TERM GOAL #1   Title Pt will increase to at least 34m/s in order to demonstrate clinically significant decreased fall risk in community ambulation.    Baseline 03/30/20 0.31m/s    Time 8    Period Weeks    Status New      PT LONG TERM GOAL #2   Title Pt will demonstrate ind bed mobility, and basic transfers to increase ind and safety at home    Baseline 03/30/20 chair <> chair modI with RW superivison; STS modI with supervision; rolling modA needed; supine <> sit modI supervision    Time 8    Period Weeks    Status New      PT LONG TERM GOAL #4   Title Pt will increase by at least 65m (78ft) in order to demonstrate clinically significant improvement in cardiopulmonary and muscular endurance and community ambulation    Baseline 03/30/20 44ft    Time 8    Period Weeks    Status New      PT LONG TERM GOAL #5   Title Patient will increase FOTO score to 46 to demonstrate predicted increase in functional mobility to complete ADLs    Baseline 03/30/20 20    Time 8    Period Weeks    Status New                  Plan - 03/30/20 0925    Clinical Impression Statement Pt is a 85 year old male presenting s/p R THA posterior approach 01/21/20, with functional decline d/t hip and balance deficits over the past year or so. Impairments in decreased static and dynamic balance, abnormal posture, decreased bilat hip and knee mobility (largest  deficit in hip + knee ext), decreased LE and core strength, decreased muscular and cardiovascular endurance, decreased upright tolerance, and pain. Activity limitations in bed mobility, basic transfers, ambulation, stair negotiation, bathing, dressing, lifting/squatting;  inhibiting full participation in safe, ind ADLs. Pt will benefit from skilled PT to address impairments to return to optimal level of function.    Personal Factors and Comorbidities Comorbidity 3+;Comorbidity 1;Comorbidity 2;Age;Past/Current Experience;Fitness;Time since onset of injury/illness/exacerbation    Comorbidities CAD, CHF, HTN, CKD, arthritis, anxiety/depression    Examination-Activity Limitations Bathing;Bed Mobility;Dressing;Lift;Carry;Squat;Transfers;Stairs;Stand;Reach Overhead    Examination-Participation Restrictions Community Activity;Laundry;Driving;Yard Work;Meal Prep;Shop    Stability/Clinical Decision Making Evolving/Moderate complexity    Clinical Decision Making Moderate    Rehab Potential Good    PT Frequency 2x / week    PT Duration 8 weeks    PT Treatment/Interventions Electrical Stimulation;ADLs/Self Care Home Management;Moist Heat;Cryotherapy;Iontophoresis 4mg /ml Dexamethasone;Traction;Ultrasound;Gait training;DME Instruction;Therapeutic activities;Functional mobility training;Stair training;Therapeutic exercise;Balance training;Neuromuscular re-education;Patient/family education;Passive range of motion;Dry needling;Manual techniques;Energy conservation;Visual/perceptual remediation/compensation;Spinal Manipulations;Joint Manipulations    PT Next Visit Plan HEP review, gait training, energy conservation, transfers    PT Home Exercise Plan in addition to chair exercises: STS, heel raises, narrow BOS standing, amb "across home" 2-3x/day    Consulted and Agree with Plan of Care Patient           Patient will benefit from skilled therapeutic intervention in order to improve the following deficits and impairments:  Cardiopulmonary status limiting activity, Abnormal gait, Decreased activity tolerance, Decreased endurance, Decreased knowledge of use of DME, Decreased range of motion, Decreased mobility, Decreased balance, Decreased coordination, Decreased safety awareness,  Difficulty walking, Increased muscle spasms, Decreased strength, Increased fascial restricitons, Impaired UE functional use, Improper body mechanics, Pain, Postural dysfunction, Impaired flexibility  Visit Diagnosis: Pain in right hip  Stiffness of right hip, not elsewhere classified  Other abnormalities of gait and mobility  Repeated falls     Problem List Patient Active Problem List   Diagnosis Date Noted  . Hx of total hip arthroplasty, right 01/21/2020  . Sepsis (HCC) 03/20/2019  . Chronic systolic CHF (congestive heart failure) (HCC) 03/20/2019  . CAD (coronary artery disease) 03/20/2019  . HTN (hypertension) 03/20/2019  . Acute on chronic renal failure (HCC) 03/20/2019  . Severe sepsis (HCC) 03/20/2019  . CKD (chronic kidney disease) stage 3, GFR 30-59 ml/min (HCC) 02/11/2019  . Right leg pain 12/13/2018  . Acute exacerbation of CHF (congestive heart failure) (HCC) 12/09/2018  . Benign essential hypertension 01/12/2014  . Gout 01/12/2014   Hilda Liashelsea Azharia Surratt DPT Hilda Liashelsea Ripley Lovecchio 03/30/2020, 11:15 AM  [facility] [address] [place], [place], [address] Phone: [phone]   Fax:  [phone]  Name: Jerome EssexGordon Craig Sainz Jr. MRN: [id number] Date of Birth: [birth year]

## 2020-03-30 NOTE — Addendum Note (Signed)
Addended by: Lawrence Marseilles on: 03/30/2020 11:17 AM   Modules accepted: Orders

## 2020-04-01 ENCOUNTER — Encounter: Payer: Self-pay | Admitting: Physical Therapy

## 2020-04-01 ENCOUNTER — Ambulatory Visit: Payer: Medicare Other | Admitting: Physical Therapy

## 2020-04-01 ENCOUNTER — Other Ambulatory Visit: Payer: Self-pay

## 2020-04-01 DIAGNOSIS — M25551 Pain in right hip: Secondary | ICD-10-CM

## 2020-04-01 DIAGNOSIS — R296 Repeated falls: Secondary | ICD-10-CM

## 2020-04-01 DIAGNOSIS — M25651 Stiffness of right hip, not elsewhere classified: Secondary | ICD-10-CM

## 2020-04-01 DIAGNOSIS — R2689 Other abnormalities of gait and mobility: Secondary | ICD-10-CM

## 2020-04-01 NOTE — Therapy (Signed)
[place] [facility] [address] [place], [address] Phone: [phone]   Fax:  [phone]  Physical Therapy Treatment  Patient Details  Name: Jerome [name]. MRN: [id number] Date of Birth: 14-Feb-1935 No data recorded  Encounter Date: 04/01/2020   PT End of Session - 04/01/20 1626    Visit Number 2    Number of Visits 17    Date for PT Re-Evaluation 05/28/20    Authorization - Visit Number 2    Authorization - Number of Visits 10    PT Start Time 0415    PT Stop Time 0500    PT Time Calculation (min) 45 min    Equipment Utilized During Treatment Gait belt    Activity Tolerance Patient tolerated treatment well;Patient limited by fatigue    Behavior During Therapy WFL for tasks assessed/performed           Past Medical History:  Diagnosis Date  . Anxiety   . Arthritis   . CAD (coronary artery disease)   . Chronic kidney disease    stage 3  . Chronic systolic CHF (congestive heart failure) (HCC)   . Depression   . Hypertension     Past Surgical History:  Procedure Laterality Date  . APPENDECTOMY    . HERNIA REPAIR Right    inguinal  . JOINT REPLACEMENT Right    knee  . TOTAL HIP ARTHROPLASTY Right 01/21/2020   Procedure: TOTAL HIP ARTHROPLASTY;  Surgeon: Donato Heinz, MD;  Location: [facility] ORS;  Service: Orthopedics;  Laterality: Right;    There were no vitals filed for this visit.   Subjective Assessment - 04/01/20 1625    Subjective Patient reports he has no pain in R hip, only L knee. He has been trying to walk more at home.    Pertinent History Pt presents s/p R THA posterior approach 01/21/20. Completed 3 weeks of rehab at [facility] following, has not completed HHPT. Propels WC into clinic, reports he uses his WC if he is going in the community for appts, and uses RW at home. Prior to surgery he was using the WC and RW because he was not getting around well d/t hip pain, had been  using a cane since R knee replacement "years ago" but would like to get back to walking without AD. Patient reports he has the ability to drive, but has not drove since hip replacement. His son lives with him, drives him to appts, completing grocery shopping, cooking, and cleaning. Patient has an aid that comes 3x/week and bathes him sitting EOB, and dresses him. The remaining days he only wears a tshirt and underwear, is unable to fully dress without assistance. Prior to surgery hospice was coming in and bathing him as well, reports he has not been getting in his shower for a "long time" because he has a tub/shower unit and no shower seat has been able to fit in his shower. Patient is able to transfer bed <> chair, STS, and complete bed mobility modI. Pt has ramp to enter his home and no stairs inside. Reports he completes 1-2 19ft amb with RW/day and completing sitting exercises occassionally. Pt is retired and reports he has no hobbies he is able to complete anymore. "Many years ago I enjoyed hunting/fishing, and playing softball". He would like to take his own showers, but reports it has been a while since he could do so d/t balance deficits as well. Denies pain  or sensation deficits of RLE at this time, some soreness of LLE. Pt denies N/V, B&B changes, unexplained weight fluctuation, saddle paresthesia, fever, night sweats, or unrelenting night pain at this time.    How long can you sit comfortably? unlimited; sits most of the day    How long can you stand comfortably? >38mins    How long can you walk comfortably? >3mins    Patient Stated Goals take my own showers and walk better    Pain Onset More than a month ago           Ther-Ex Nustep L2 seat 8 for low level strengthening with cuing to maintain SPM over 40 STS from elevated nustep 2x 10 attempted witout UE support, occasional gower for stand Bilat heel raises 2x 10 with min cuing for "light touch on RW" with good carry over; standing  rests between sets Narrow BOS 30sec hold, with chair behind, RW in front for safety Side stepping 69ft 2x L and R with BUE support, cuing for foot clearance with good carry over  Gait training  53ft with RW with fairly consistent cuing for increased R step length and upright standing, decent carry over Adjusted walker; 18ft with 2# AW on RLE for increased RLE propulsion with success.  Supervision - CGA for safety throughout                       PT Education - 04/01/20 1626    Education Details therex form/technique, HEP review    Person(s) Educated Patient    Methods Explanation;Demonstration;Verbal cues    Comprehension Verbalized understanding;Returned demonstration;Verbal cues required            PT Short Term Goals - 03/30/20 1047      PT SHORT TERM GOAL #1   Title Pt will be independent with HEP in order to improve strength and decrease back pain in order to improve pain-free function at home and work.    Baseline 03/29/20 HEP given    Time 4    Period Weeks    Status New      PT SHORT TERM GOAL #2   Title Pt will increase to at least 0.13m/s in order to demonstrate clinically significant safe household and limited community ambulation    Baseline 03/30/20 0.39m/s             PT Long Term Goals - 03/30/20 1050      PT LONG TERM GOAL #1   Title Pt will increase to at least 79m/s in order to demonstrate clinically significant decreased fall risk in community ambulation.    Baseline 03/30/20 0.70m/s    Time 8    Period Weeks    Status New      PT LONG TERM GOAL #2   Title Pt will demonstrate ind bed mobility, and basic transfers to increase ind and safety at home    Baseline 03/30/20 chair <> chair modI with RW superivison; STS modI with supervision; rolling modA needed; supine <> sit modI supervision    Time 8    Period Weeks    Status New      PT LONG TERM GOAL #4   Title Pt will increase by at least 45m (66ft) in order to  demonstrate clinically significant improvement in cardiopulmonary and muscular endurance and community ambulation    Baseline 03/30/20 109ft    Time 8    Period Weeks    Status New  PT LONG TERM GOAL #5   Title Patient will increase FOTO score to 46 to demonstrate predicted increase in functional mobility to complete ADLs    Baseline 03/30/20 20    Time 8    Period Weeks    Status New                 Plan - 04/01/20 1638    Clinical Impression Statement PT initiated therex progression for increased BLE (R>L) strength, and normalized gait with good success. Paitent is able to demonstrate HEP exercises with good understanding of minimal corrections, and is able to compoly with cuing of all therex to achieve proper technique. Pt is motivated throughout session with no increased pain. PT will continue progression as able.    Personal Factors and Comorbidities Comorbidity 3+;Comorbidity 1;Comorbidity 2;Age;Past/Current Experience;Fitness;Time since onset of injury/illness/exacerbation    Comorbidities CAD, CHF, HTN, CKD, arthritis, anxiety/depression    Examination-Activity Limitations Bathing;Bed Mobility;Dressing;Lift;Carry;Squat;Transfers;Stairs;Stand;Reach Overhead    Examination-Participation Restrictions Community Activity;Laundry;Driving;Yard Work;Meal Prep;Shop    Stability/Clinical Decision Making Evolving/Moderate complexity    Clinical Decision Making Moderate    Rehab Potential Good    PT Frequency 2x / week    PT Duration 8 weeks    PT Treatment/Interventions Electrical Stimulation;ADLs/Self Care Home Management;Moist Heat;Cryotherapy;Iontophoresis 4mg /ml Dexamethasone;Traction;Ultrasound;Gait training;DME Instruction;Therapeutic activities;Functional mobility training;Stair training;Therapeutic exercise;Balance training;Neuromuscular re-education;Patient/family education;Passive range of motion;Dry needling;Manual techniques;Energy conservation;Visual/perceptual  remediation/compensation;Spinal Manipulations;Joint Manipulations    PT Next Visit Plan HEP review, gait training, energy conservation, transfers    PT Home Exercise Plan in addition to chair exercises: STS, heel raises, narrow BOS standing, amb "across home" 2-3x/day    Consulted and Agree with Plan of Care Patient           Patient will benefit from skilled therapeutic intervention in order to improve the following deficits and impairments:  Cardiopulmonary status limiting activity, Abnormal gait, Decreased activity tolerance, Decreased endurance, Decreased knowledge of use of DME, Decreased range of motion, Decreased mobility, Decreased balance, Decreased coordination, Decreased safety awareness, Difficulty walking, Increased muscle spasms, Decreased strength, Increased fascial restricitons, Impaired UE functional use, Improper body mechanics, Pain, Postural dysfunction, Impaired flexibility  Visit Diagnosis: Pain in right hip  Stiffness of right hip, not elsewhere classified  Other abnormalities of gait and mobility  Repeated falls     Problem List Patient Active Problem List   Diagnosis Date Noted  . Hx of total hip arthroplasty, right 01/21/2020  . Sepsis (HCC) 03/20/2019  . Chronic systolic CHF (congestive heart failure) (HCC) 03/20/2019  . CAD (coronary artery disease) 03/20/2019  . HTN (hypertension) 03/20/2019  . Acute on chronic renal failure (HCC) 03/20/2019  . Severe sepsis (HCC) 03/20/2019  . CKD (chronic kidney disease) stage 3, GFR 30-59 ml/min (HCC) 02/11/2019  . Right leg pain 12/13/2018  . Acute exacerbation of CHF (congestive heart failure) (HCC) 12/09/2018  . Benign essential hypertension 01/12/2014  . Gout 01/12/2014   01/14/2014 DPT Hilda Lias 04/01/2020, 4:59 PM  [place] [facility] PHYS[facility] AND SPORTS MEDICINE [address], [address], [place] Phone: [phone]   Fax:  [phone]  Name: Jerome [name]. MRN: [name] Date of Birth: 1935/05/19

## 2020-04-07 ENCOUNTER — Other Ambulatory Visit: Payer: Self-pay

## 2020-04-07 ENCOUNTER — Ambulatory Visit: Payer: Medicare Other | Admitting: Physical Therapy

## 2020-04-09 ENCOUNTER — Other Ambulatory Visit: Payer: Self-pay

## 2020-04-09 ENCOUNTER — Encounter: Payer: Self-pay | Admitting: Physical Therapy

## 2020-04-09 ENCOUNTER — Ambulatory Visit: Payer: Medicare Other | Admitting: Physical Therapy

## 2020-04-09 DIAGNOSIS — R296 Repeated falls: Secondary | ICD-10-CM

## 2020-04-09 DIAGNOSIS — R2689 Other abnormalities of gait and mobility: Secondary | ICD-10-CM

## 2020-04-09 DIAGNOSIS — M25551 Pain in right hip: Secondary | ICD-10-CM | POA: Diagnosis not present

## 2020-04-09 DIAGNOSIS — M25651 Stiffness of right hip, not elsewhere classified: Secondary | ICD-10-CM

## 2020-04-09 NOTE — Therapy (Signed)
[facility] [address] [place], [address] Phone: [phone]   Fax:  [phone]  Physical Therapy Treatment  Patient Details  Name: Jerome [name]. MRN: [id number] Date of Birth: Oct 15, 1934 No data recorded  Encounter Date: 04/09/2020   PT End of Session - 04/09/20 6712    Visit Number 3    Number of Visits 17    Date for PT Re-Evaluation 05/28/20    Authorization - Visit Number 3    Authorization - Number of Visits 10    PT Start Time 0915    PT Stop Time 0955    PT Time Calculation (min) 40 min    Equipment Utilized During Treatment Gait belt    Activity Tolerance Patient tolerated treatment well;Patient limited by fatigue    Behavior During Therapy WFL for tasks assessed/performed           Past Medical History:  Diagnosis Date   Anxiety    Arthritis    CAD (coronary artery disease)    Chronic kidney disease    stage 3   Chronic systolic CHF (congestive heart failure) (HCC)    Depression    Hypertension     Past Surgical History:  Procedure Laterality Date   APPENDECTOMY     HERNIA REPAIR Right    inguinal   JOINT REPLACEMENT Right    knee   TOTAL HIP ARTHROPLASTY Right 01/21/2020   Procedure: TOTAL HIP ARTHROPLASTY;  Surgeon: Donato Heinz, MD;  Location: [facility] ORS;  Service: Orthopedics;  Laterality: Right;    There were no vitals filed for this visit.   Subjective Assessment - 04/09/20 0917    Subjective Pt reports he has been completing his HEP and trying to walk more with his RW. Reports he is having the most difficulty with LLE with ambulation. No R hip pain this am.    Pertinent History Pt presents s/p R THA posterior approach 01/21/20. Completed 3 weeks of rehab at [facility] following, has not completed HHPT. Propels WC into clinic, reports he uses his WC if he is going in the community for appts, and uses RW at home. Prior to surgery he was using the  WC and RW because he was not getting around well d/t hip pain, had been using a cane since R knee replacement "years ago" but would like to get back to walking without AD. Patient reports he has the ability to drive, but has not drove since hip replacement. His son lives with him, drives him to appts, completing grocery shopping, cooking, and cleaning. Patient has an aid that comes 3x/week and bathes him sitting EOB, and dresses him. The remaining days he only wears a tshirt and underwear, is unable to fully dress without assistance. Prior to surgery hospice was coming in and bathing him as well, reports he has not been getting in his shower for a "long time" because he has a tub/shower unit and no shower seat has been able to fit in his shower. Patient is able to transfer bed <> chair, STS, and complete bed mobility modI. Pt has ramp to enter his home and no stairs inside. Reports he completes 1-2 36ft amb with RW/day and completing sitting exercises occassionally. Pt is retired and reports he has no hobbies he is able to complete anymore. "Many years ago I enjoyed hunting/fishing, and playing softball". He would like to take his own showers, but reports it has been a while  since he could do so d/t balance deficits as well. Denies pain or sensation deficits of RLE at this time, some soreness of LLE. Pt denies N/V, B&B changes, unexplained weight fluctuation, saddle paresthesia, fever, night sweats, or unrelenting night pain at this time.    Limitations Lifting;Standing;Sitting;Walking    How long can you sit comfortably? unlimited; sits most of the day    How long can you stand comfortably? >35mins    How long can you walk comfortably? >1mins    Patient Stated Goals take my own showers and walk better    Pain Onset More than a month ago             Ther-Ex Nustep L2 seat 8 for low level strengthening with cuing to maintain SPM over 40 STS from elevated nustep 2x 10 RW for initiation, cuing to  prevent R heel lift, appears as LLD Standing hip abd 3x 5 bilat with standing rest between. Consistent cuing for upright posture with some carry over Modified thomas stretch hold; transfer with modI sit > sup; minA sup > sit  Gait training  20ft with 3# AW on RLE for increased RLE propulsion with success; min cuing at end of amb for increased RLE foot clearance Education on LLD impact on gait mechanics and inreased LLE demand > pain with good understanding Supervision - CGA for safety throughout           PT Education - 04/09/20 0925    Education Details therex form/technique    Person(s) Educated Patient    Methods Explanation;Demonstration;Tactile cues;Verbal cues    Comprehension Verbalized understanding;Returned demonstration;Verbal cues required;Tactile cues required            PT Short Term Goals - 03/30/20 1047      PT SHORT TERM GOAL #1   Title Pt will be independent with HEP in order to improve strength and decrease back pain in order to improve pain-free function at home and work.    Baseline 03/29/20 HEP given    Time 4    Period Weeks    Status New      PT SHORT TERM GOAL #2   Title Pt will increase to at least 0.73m/s in order to demonstrate clinically significant safe household and limited community ambulation    Baseline 03/30/20 0.65m/s             PT Long Term Goals - 03/30/20 1050      PT LONG TERM GOAL #1   Title Pt will increase to at least 44m/s in order to demonstrate clinically significant decreased fall risk in community ambulation.    Baseline 03/30/20 0.10m/s    Time 8    Period Weeks    Status New      PT LONG TERM GOAL #2   Title Pt will demonstrate ind bed mobility, and basic transfers to increase ind and safety at home    Baseline 03/30/20 chair <> chair modI with RW superivison; STS modI with supervision; rolling modA needed; supine <> sit modI supervision    Time 8    Period Weeks    Status New      PT LONG  TERM GOAL #4   Title Pt will increase by at least 36m (16ft) in order to demonstrate clinically significant improvement in cardiopulmonary and muscular endurance and community ambulation    Baseline 03/30/20 22ft    Time 8    Period Weeks    Status  New      PT LONG TERM GOAL #5   Title Patient will increase FOTO score to 46 to demonstrate predicted increase in functional mobility to complete ADLs    Baseline 03/30/20 20    Time 8    Period Weeks    Status New                 Plan - 04/09/20 0930    Clinical Impression Statement PT continued therex progression for increased functional strength and normalized gait with success. Pt demonstrates LLD (RLE shorter) possibly d/t excessive muscle tension of hip flexors. PT educated patient on this and updated HEP to include hip flexor stretch with verbalized understanding. Pt is motivated throughout session, but limited by LLE pain. PT will continue progression as able.    Personal Factors and Comorbidities Comorbidity 3+;Comorbidity 1;Comorbidity 2;Age;Past/Current Experience;Fitness;Time since onset of injury/illness/exacerbation    Comorbidities CAD, CHF, HTN, CKD, arthritis, anxiety/depression    Examination-Activity Limitations Bathing;Bed Mobility;Dressing;Lift;Carry;Squat;Transfers;Stairs;Stand;Reach Overhead    Examination-Participation Restrictions Community Activity;Laundry;Driving;Yard Work;Meal Prep;Shop    Stability/Clinical Decision Making Evolving/Moderate complexity    Clinical Decision Making Moderate    Rehab Potential Good    PT Frequency 2x / week    PT Duration 8 weeks    PT Treatment/Interventions Electrical Stimulation;ADLs/Self Care Home Management;Moist Heat;Cryotherapy;Iontophoresis 4mg /ml Dexamethasone;Traction;Ultrasound;Gait training;DME Instruction;Therapeutic activities;Functional mobility training;Stair training;Therapeutic exercise;Balance training;Neuromuscular re-education;Patient/family  education;Passive range of motion;Dry needling;Manual techniques;Energy conservation;Visual/perceptual remediation/compensation;Spinal Manipulations;Joint Manipulations    PT Next Visit Plan HEP review, gait training, energy conservation, transfers    PT Home Exercise Plan in addition to chair exercises: STS, heel raises, narrow BOS standing, amb "across home" 2-3x/day    Consulted and Agree with Plan of Care Patient           Patient will benefit from skilled therapeutic intervention in order to improve the following deficits and impairments:  Cardiopulmonary status limiting activity, Abnormal gait, Decreased activity tolerance, Decreased endurance, Decreased knowledge of use of DME, Decreased range of motion, Decreased mobility, Decreased balance, Decreased coordination, Decreased safety awareness, Difficulty walking, Increased muscle spasms, Decreased strength, Increased fascial restricitons, Impaired UE functional use, Improper body mechanics, Pain, Postural dysfunction, Impaired flexibility  Visit Diagnosis: Pain in right hip  Stiffness of right hip, not elsewhere classified  Other abnormalities of gait and mobility  Repeated falls     Problem List Patient Active Problem List   Diagnosis Date Noted   Hx of total hip arthroplasty, right 01/21/2020   Sepsis (HCC) 03/20/2019   Chronic systolic CHF (congestive heart failure) (HCC) 03/20/2019   CAD (coronary artery disease) 03/20/2019   HTN (hypertension) 03/20/2019   Acute on chronic renal failure (HCC) 03/20/2019   Severe sepsis (HCC) 03/20/2019   CKD (chronic kidney disease) stage 3, GFR 30-59 ml/min (HCC) 02/11/2019   Right leg pain 12/13/2018   Acute exacerbation of CHF (congestive heart failure) (HCC) 12/09/2018   Benign essential hypertension 01/12/2014   Gout 01/12/2014   01/14/2014 DPT Hilda Lias 04/09/2020, 10:01 AM  [facility]  MEDICINE [address], [place] Phone: [phone]   Fax:  [phone]  Name: Jerome [name]. MRN: [name] Date of Birth: [birth year]

## 2020-04-16 ENCOUNTER — Ambulatory Visit: Payer: Medicare Other | Admitting: Physical Therapy

## 2020-04-20 ENCOUNTER — Encounter: Payer: Self-pay | Admitting: Physical Therapy

## 2020-04-20 ENCOUNTER — Other Ambulatory Visit: Payer: Self-pay

## 2020-04-20 ENCOUNTER — Ambulatory Visit: Payer: Medicare Other | Attending: Orthopedic Surgery | Admitting: Physical Therapy

## 2020-04-20 DIAGNOSIS — M25651 Stiffness of right hip, not elsewhere classified: Secondary | ICD-10-CM | POA: Diagnosis present

## 2020-04-20 DIAGNOSIS — M25562 Pain in left knee: Secondary | ICD-10-CM | POA: Diagnosis present

## 2020-04-20 DIAGNOSIS — R296 Repeated falls: Secondary | ICD-10-CM | POA: Insufficient documentation

## 2020-04-20 DIAGNOSIS — M25551 Pain in right hip: Secondary | ICD-10-CM | POA: Insufficient documentation

## 2020-04-20 DIAGNOSIS — G8929 Other chronic pain: Secondary | ICD-10-CM | POA: Diagnosis present

## 2020-04-20 DIAGNOSIS — R2689 Other abnormalities of gait and mobility: Secondary | ICD-10-CM

## 2020-04-20 NOTE — Therapy (Signed)
[facility] [address], [place], [address] Phone: [phone]   Fax:  [phone]  Physical Therapy Treatment  Patient Details  Name: Jerome [name]. MRN: [id number] Date of Birth: 01-29-1935 No data recorded  Encounter Date: 04/20/2020   PT End of Session - 04/20/20 1438    Visit Number 4    Number of Visits 17    Date for PT Re-Evaluation 05/28/20    Authorization - Visit Number 4    Authorization - Number of Visits 10    PT Start Time 0230    PT Stop Time 0315    PT Time Calculation (min) 45 min    Equipment Utilized During Treatment Gait belt    Activity Tolerance Patient tolerated treatment well;Patient limited by fatigue    Behavior During Therapy WFL for tasks assessed/performed           Past Medical History:  Diagnosis Date  . Anxiety   . Arthritis   . CAD (coronary artery disease)   . Chronic kidney disease    stage 3  . Chronic systolic CHF (congestive heart failure) (HCC)   . Depression   . Hypertension     Past Surgical History:  Procedure Laterality Date  . APPENDECTOMY    . HERNIA REPAIR Right    inguinal  . JOINT REPLACEMENT Right    knee  . TOTAL HIP ARTHROPLASTY Right 01/21/2020   Procedure: TOTAL HIP ARTHROPLASTY;  Surgeon: Donato Heinz, MD;  Location: [facility] ORS;  Service: Orthopedics;  Laterality: Right;    There were no vitals filed for this visit.   Subjective Assessment - 04/20/20 1434    Subjective Pt reports his L knee has been bothering him more than the R hip. Has missed some PT appts d/t transportation issues, but has been completing HEP and walking more in his home. He still needs help with bathing and dressing, and help cleaning following toileting.    Pertinent History Pt presents s/p R THA posterior approach 01/21/20. Completed 3 weeks of rehab at [facility] following, has not completed HHPT. Propels WC into clinic, reports he uses his WC if he  is going in the community for appts, and uses RW at home. Prior to surgery he was using the WC and RW because he was not getting around well d/t hip pain, had been using a cane since R knee replacement "years ago" but would like to get back to walking without AD. Patient reports he has the ability to drive, but has not drove since hip replacement. His son lives with him, drives him to appts, completing grocery shopping, cooking, and cleaning. Patient has an aid that comes 3x/week and bathes him sitting EOB, and dresses him. The remaining days he only wears a tshirt and underwear, is unable to fully dress without assistance. Prior to surgery hospice was coming in and bathing him as well, reports he has not been getting in his shower for a "long time" because he has a tub/shower unit and no shower seat has been able to fit in his shower. Patient is able to transfer bed <> chair, STS, and complete bed mobility modI. Pt has ramp to enter his home and no stairs inside. Reports he completes 1-2 34ft amb with RW/day and completing sitting exercises occassionally. Pt is retired and reports he has no hobbies he is able to complete anymore. "Many years ago I enjoyed hunting/fishing, and playing softball". He would  like to take his own showers, but reports it has been a while since he could do so d/t balance deficits as well. Denies pain or sensation deficits of RLE at this time, some soreness of LLE. Pt denies N/V, B&B changes, unexplained weight fluctuation, saddle paresthesia, fever, night sweats, or unrelenting night pain at this time.    Limitations Lifting;Standing;Sitting;Walking    How long can you sit comfortably? unlimited; sits most of the day    How long can you stand comfortably? >63mins    How long can you walk comfortably? >62mins    Patient Stated Goals take my own showers and walk better    Pain Onset More than a month ago          Ther-Ex Nustep L2 seat 8 for low level strengthening with cuing  to maintain SPM over 40 STS from elevated nustep without UE use with BUE flex (90d) with stand With 3# AW on RLE Standing alt hip flex with RW 2x 12 with cuing for upright posture with good carry over Seated hip flex with wt shift 2x 12 cuing to lift ischial tuberosity with success Education needed strength and mobility for ind pants/shoes (forward trunk flex, hip mobility, wt shifting, core strength, etc)    Gait training  140ft with 3# AW on RLE for increased RLE propulsion with success; min cuing at end of amb for increased RLE foot clearance Education on LLD impact on gait mechanics and inreased LLE demand > pain with good understanding Supervision - CGA for safety throughout              PT Education - 04/20/20 1438    Education Details therex form/technique    Person(s) Educated Patient    Methods Explanation;Demonstration;Verbal cues    Comprehension Verbalized understanding;Returned demonstration;Verbal cues required            PT Short Term Goals - 03/30/20 1047      PT SHORT TERM GOAL #1   Title Pt will be independent with HEP in order to improve strength and decrease back pain in order to improve pain-free function at home and work.    Baseline 03/29/20 HEP given    Time 4    Period Weeks    Status New      PT SHORT TERM GOAL #2   Title Pt will increase to at least 0.61m/s in order to demonstrate clinically significant safe household and limited community ambulation    Baseline 03/30/20 0.41m/s             PT Long Term Goals - 03/30/20 1050      PT LONG TERM GOAL #1   Title Pt will increase to at least 58m/s in order to demonstrate clinically significant decreased fall risk in community ambulation.    Baseline 03/30/20 0.69m/s    Time 8    Period Weeks    Status New      PT LONG TERM GOAL #2   Title Pt will demonstrate ind bed mobility, and basic transfers to increase ind and safety at home    Baseline 03/30/20 chair <> chair modI  with RW superivison; STS modI with supervision; rolling modA needed; supine <> sit modI supervision    Time 8    Period Weeks    Status New      PT LONG TERM GOAL #4   Title Pt will increase by at least 51m (54ft) in order to demonstrate clinically significant improvement  in cardiopulmonary and muscular endurance and community ambulation    Baseline 03/30/20 72ft    Time 8    Period Weeks    Status New      PT LONG TERM GOAL #5   Title Patient will increase FOTO score to 46 to demonstrate predicted increase in functional mobility to complete ADLs    Baseline 03/30/20 20    Time 8    Period Weeks    Status New                 Plan - 04/20/20 1445    Clinical Impression Statement PT continued therex progression for increased ind with functional mobility and RLE strengthening with success. Pt is able to demonstrate increased gait and standing tolerance throughout session. PT lead patient through components of donning/doffing shoes/pants with limitations identified with HEP updated to reflect this. Educated on increasing standing time to reduce hip flexor tension created through prolonged sitting. Patient verbalized understanding of recommendations. PT will continue progression as able.    Personal Factors and Comorbidities Comorbidity 3+;Comorbidity 1;Comorbidity 2;Age;Past/Current Experience;Fitness;Time since onset of injury/illness/exacerbation    Comorbidities CAD, CHF, HTN, CKD, arthritis, anxiety/depression    Examination-Activity Limitations Bathing;Bed Mobility;Dressing;Lift;Carry;Squat;Transfers;Stairs;Stand;Reach Overhead    Examination-Participation Restrictions Community Activity;Laundry;Driving;Yard Work;Meal Prep;Shop    Stability/Clinical Decision Making Evolving/Moderate complexity    Clinical Decision Making Moderate    Rehab Potential Good    PT Frequency 2x / week    PT Duration 8 weeks    PT Treatment/Interventions Electrical Stimulation;ADLs/Self Care  Home Management;Moist Heat;Cryotherapy;Iontophoresis 4mg /ml Dexamethasone;Traction;Ultrasound;Gait training;DME Instruction;Therapeutic activities;Functional mobility training;Stair training;Therapeutic exercise;Balance training;Neuromuscular re-education;Patient/family education;Passive range of motion;Dry needling;Manual techniques;Energy conservation;Visual/perceptual remediation/compensation;Spinal Manipulations;Joint Manipulations    PT Next Visit Plan HEP review, gait training, energy conservation, transfers    PT Home Exercise Plan in addition to chair exercises: STS, heel raises, narrow BOS standing, amb "across home" 2-3x/day    Consulted and Agree with Plan of Care Patient           Patient will benefit from skilled therapeutic intervention in order to improve the following deficits and impairments:  Cardiopulmonary status limiting activity, Abnormal gait, Decreased activity tolerance, Decreased endurance, Decreased knowledge of use of DME, Decreased range of motion, Decreased mobility, Decreased balance, Decreased coordination, Decreased safety awareness, Difficulty walking, Increased muscle spasms, Decreased strength, Increased fascial restricitons, Impaired UE functional use, Improper body mechanics, Pain, Postural dysfunction, Impaired flexibility  Visit Diagnosis: Pain in right hip  Stiffness of right hip, not elsewhere classified  Other abnormalities of gait and mobility  Repeated falls     Problem List Patient Active Problem List   Diagnosis Date Noted  . Hx of total hip arthroplasty, right 01/21/2020  . Sepsis (HCC) 03/20/2019  . Chronic systolic CHF (congestive heart failure) (HCC) 03/20/2019  . CAD (coronary artery disease) 03/20/2019  . HTN (hypertension) 03/20/2019  . Acute on chronic renal failure (HCC) 03/20/2019  . Severe sepsis (HCC) 03/20/2019  . CKD (chronic kidney disease) stage 3, GFR 30-59 ml/min (HCC) 02/11/2019  . Right leg pain 12/13/2018  . Acute  exacerbation of CHF (congestive heart failure) (HCC) 12/09/2018  . Benign essential hypertension 01/12/2014  . Gout 01/12/2014   01/14/2014 DPT Hilda Lias 04/20/2020, 3:52 PM  [facility] SPORTS MEDICINE [address], [address], [place] Phone: [phone]   Fax:  [phone]  Name: Jerome [name]. MRN: [name] Date of Birth: 1934/09/10

## 2020-04-22 ENCOUNTER — Other Ambulatory Visit: Payer: Self-pay

## 2020-04-22 ENCOUNTER — Encounter: Payer: Self-pay | Admitting: Physical Therapy

## 2020-04-22 ENCOUNTER — Ambulatory Visit: Payer: Medicare Other | Admitting: Physical Therapy

## 2020-04-22 DIAGNOSIS — M25551 Pain in right hip: Secondary | ICD-10-CM

## 2020-04-22 DIAGNOSIS — M25651 Stiffness of right hip, not elsewhere classified: Secondary | ICD-10-CM

## 2020-04-22 DIAGNOSIS — R2689 Other abnormalities of gait and mobility: Secondary | ICD-10-CM

## 2020-04-22 DIAGNOSIS — R296 Repeated falls: Secondary | ICD-10-CM

## 2020-04-22 NOTE — Therapy (Signed)
[facility] [address] [place], [address] Phone: [phone]   Fax:  [phone]  Physical Therapy Treatment  Patient Details  Name: Jerome [name]. MRN: [id number] Date of Birth: 1934/12/25 No data recorded  Encounter Date: 04/22/2020   PT End of Session - 04/22/20 1607    Visit Number 5    Number of Visits 17    Date for PT Re-Evaluation 05/28/20    Authorization - Visit Number 5    Authorization - Number of Visits 10    PT Start Time 0400    PT Stop Time 0440    PT Time Calculation (min) 40 min    Equipment Utilized During Treatment Gait belt    Activity Tolerance Patient tolerated treatment well;Patient limited by fatigue    Behavior During Therapy WFL for tasks assessed/performed           Past Medical History:  Diagnosis Date  . Anxiety   . Arthritis   . CAD (coronary artery disease)   . Chronic kidney disease    stage 3  . Chronic systolic CHF (congestive heart failure) (HCC)   . Depression   . Hypertension     Past Surgical History:  Procedure Laterality Date  . APPENDECTOMY    . HERNIA REPAIR Right    inguinal  . JOINT REPLACEMENT Right    knee  . TOTAL HIP ARTHROPLASTY Right 01/21/2020   Procedure: TOTAL HIP ARTHROPLASTY;  Surgeon: Donato Heinz, MD;  Location: [facility] ORS;  Service: Orthopedics;  Laterality: Right;    There were no vitals filed for this visit.   Subjective Assessment - 04/22/20 1558    Subjective Reports he has been trying to stand more since last visit, is completing all HEP exercises. Reports more LLE pain than R hip pain.    Pertinent History Pt presents s/p R THA posterior approach 01/21/20. Completed 3 weeks of rehab at [facility] following, has not completed HHPT. Propels WC into clinic, reports he uses his WC if he is going in the community for appts, and uses RW at home. Prior to surgery he was using the WC and RW because he was not getting  around well d/t hip pain, had been using a cane since R knee replacement "years ago" but would like to get back to walking without AD. Patient reports he has the ability to drive, but has not drove since hip replacement. His son lives with him, drives him to appts, completing grocery shopping, cooking, and cleaning. Patient has an aid that comes 3x/week and bathes him sitting EOB, and dresses him. The remaining days he only wears a tshirt and underwear, is unable to fully dress without assistance. Prior to surgery hospice was coming in and bathing him as well, reports he has not been getting in his shower for a "long time" because he has a tub/shower unit and no shower seat has been able to fit in his shower. Patient is able to transfer bed <> chair, STS, and complete bed mobility modI. Pt has ramp to enter his home and no stairs inside. Reports he completes 1-2 41ft amb with RW/day and completing sitting exercises occassionally. Pt is retired and reports he has no hobbies he is able to complete anymore. "Many years ago I enjoyed hunting/fishing, and playing softball". He would like to take his own showers, but reports it has been a while since he could do so d/t balance deficits as  well. Denies pain or sensation deficits of RLE at this time, some soreness of LLE. Pt denies N/V, B&B changes, unexplained weight fluctuation, saddle paresthesia, fever, night sweats, or unrelenting night pain at this time.    Limitations Lifting;Standing;Sitting;Walking    How long can you sit comfortably? unlimited; sits most of the day    How long can you stand comfortably? >22mins    How long can you walk comfortably? >77mins    Patient Stated Goals take my own showers and walk better    Pain Onset More than a month ago           Ther-Ex Nustep L2 seat 8 for low level strengthening with cuing to maintain SPM over 40 STS from elevated nustep without BUE flexion with wand AAROM to <90d 3x 8  With 3# AW on  RLE Standing alt hip flex with RW 2x 12 with cuing for upright posture with good carry over; standing rests between  Standing BUE support alt abd 2x 12 with cuing needed for posture with decent carry over, difficulty with R SLS; standing rests between  R thomas stretch 60sec with BUE flex AAROM with wand   Gait training 158ft with3# AW on RLE for increased RLE propulsion with success; min cuing at end of amb for increased RLE foot clearance Supervision - CGA for safety throughout             PT Education - 04/22/20 1607    Education Details therex form/technique, gait training    Person(s) Educated Patient    Methods Explanation;Demonstration;Verbal cues    Comprehension Verbalized understanding;Returned demonstration;Verbal cues required            PT Short Term Goals - 03/30/20 1047      PT SHORT TERM GOAL #1   Title Pt will be independent with HEP in order to improve strength and decrease back pain in order to improve pain-free function at home and work.    Baseline 03/29/20 HEP given    Time 4    Period Weeks    Status New      PT SHORT TERM GOAL #2   Title Pt will increase to at least 0.56m/s in order to demonstrate clinically significant safe household and limited community ambulation    Baseline 03/30/20 0.51m/s             PT Long Term Goals - 03/30/20 1050      PT LONG TERM GOAL #1   Title Pt will increase to at least 76m/s in order to demonstrate clinically significant decreased fall risk in community ambulation.    Baseline 03/30/20 0.102m/s    Time 8    Period Weeks    Status New      PT LONG TERM GOAL #2   Title Pt will demonstrate ind bed mobility, and basic transfers to increase ind and safety at home    Baseline 03/30/20 chair <> chair modI with RW superivison; STS modI with supervision; rolling modA needed; supine <> sit modI supervision    Time 8    Period Weeks    Status New      PT LONG TERM GOAL #4   Title Pt will  increase by at least 47m (80ft) in order to demonstrate clinically significant improvement in cardiopulmonary and muscular endurance and community ambulation    Baseline 03/30/20 54ft    Time 8    Period Weeks    Status New  PT LONG TERM GOAL #5   Title Patient will increase FOTO score to 46 to demonstrate predicted increase in functional mobility to complete ADLs    Baseline 03/30/20 20    Time 8    Period Weeks    Status New                 Plan - 04/22/20 1618    Clinical Impression Statement PT continued therex progression for increased RLE strength and mobility, with carry over to functional activity with success. Patient is able to comply with all demo and cuing for proper technique of therex with good motivation and no increased pain throughout session. Pt is able to tolerate increased standing and walking tolerance. PT will continue progression as able.    Personal Factors and Comorbidities Comorbidity 3+;Comorbidity 1;Comorbidity 2;Age;Past/Current Experience;Fitness;Time since onset of injury/illness/exacerbation    Comorbidities CAD, CHF, HTN, CKD, arthritis, anxiety/depression    Examination-Activity Limitations Bathing;Bed Mobility;Dressing;Lift;Carry;Squat;Transfers;Stairs;Stand;Reach Overhead    Examination-Participation Restrictions Community Activity;Laundry;Driving;Yard Work;Meal Prep;Shop    Stability/Clinical Decision Making Evolving/Moderate complexity    Clinical Decision Making Moderate    Rehab Potential Good    PT Frequency 2x / week    PT Duration 8 weeks    PT Treatment/Interventions Electrical Stimulation;ADLs/Self Care Home Management;Moist Heat;Cryotherapy;Iontophoresis 4mg /ml Dexamethasone;Traction;Ultrasound;Gait training;DME Instruction;Therapeutic activities;Functional mobility training;Stair training;Therapeutic exercise;Balance training;Neuromuscular re-education;Patient/family education;Passive range of motion;Dry needling;Manual  techniques;Energy conservation;Visual/perceptual remediation/compensation;Spinal Manipulations;Joint Manipulations    PT Next Visit Plan HEP review, gait training, energy conservation, transfers    PT Home Exercise Plan in addition to chair exercises: STS, heel raises, narrow BOS standing, amb "across home" 2-3x/day    Consulted and Agree with Plan of Care Patient           Patient will benefit from skilled therapeutic intervention in order to improve the following deficits and impairments:  Cardiopulmonary status limiting activity, Abnormal gait, Decreased activity tolerance, Decreased endurance, Decreased knowledge of use of DME, Decreased range of motion, Decreased mobility, Decreased balance, Decreased coordination, Decreased safety awareness, Difficulty walking, Increased muscle spasms, Decreased strength, Increased fascial restricitons, Impaired UE functional use, Improper body mechanics, Pain, Postural dysfunction, Impaired flexibility  Visit Diagnosis: Pain in right hip  Stiffness of right hip, not elsewhere classified  Other abnormalities of gait and mobility  Repeated falls     Problem List Patient Active Problem List   Diagnosis Date Noted  . Hx of total hip arthroplasty, right 01/21/2020  . Sepsis (HCC) 03/20/2019  . Chronic systolic CHF (congestive heart failure) (HCC) 03/20/2019  . CAD (coronary artery disease) 03/20/2019  . HTN (hypertension) 03/20/2019  . Acute on chronic renal failure (HCC) 03/20/2019  . Severe sepsis (HCC) 03/20/2019  . CKD (chronic kidney disease) stage 3, GFR 30-59 ml/min (HCC) 02/11/2019  . Right leg pain 12/13/2018  . Acute exacerbation of CHF (congestive heart failure) (HCC) 12/09/2018  . Benign essential hypertension 01/12/2014  . Gout 01/12/2014   01/14/2014 DPT Hilda Lias 04/22/2020, 4:51 PM  [facility]TS [facility]IC[facility] [address], [place] Phone:  [phone]   Fax:  [phone]  Name: Barnes Florek. MRN: [name] Date of Birth: 1934/07/03

## 2020-04-27 ENCOUNTER — Other Ambulatory Visit: Payer: Self-pay

## 2020-04-27 ENCOUNTER — Encounter: Payer: Self-pay | Admitting: Physical Therapy

## 2020-04-27 ENCOUNTER — Ambulatory Visit: Payer: Medicare Other | Admitting: Physical Therapy

## 2020-04-27 DIAGNOSIS — M25551 Pain in right hip: Secondary | ICD-10-CM

## 2020-04-27 DIAGNOSIS — R296 Repeated falls: Secondary | ICD-10-CM

## 2020-04-27 DIAGNOSIS — M25651 Stiffness of right hip, not elsewhere classified: Secondary | ICD-10-CM

## 2020-04-27 DIAGNOSIS — R2689 Other abnormalities of gait and mobility: Secondary | ICD-10-CM

## 2020-04-27 NOTE — Therapy (Signed)
[facility] [address] [place], [address] Phone: [phone]   Fax:  [phone]  Physical Therapy Treatment  Patient Details  Name: Jerome [name]. MRN: [id number] Date of Birth: 1935/05/11 No data recorded  Encounter Date: 04/27/2020   PT End of Session - 04/27/20 1610    Visit Number 6    Number of Visits 17    Date for PT Re-Evaluation 05/28/20    Authorization - Visit Number 6    Authorization - Number of Visits 10    PT Start Time 0315    PT Stop Time 0400    PT Time Calculation (min) 45 min    Equipment Utilized During Treatment Gait belt    Activity Tolerance Patient tolerated treatment well;Patient limited by fatigue    Behavior During Therapy WFL for tasks assessed/performed           Past Medical History:  Diagnosis Date  . Anxiety   . Arthritis   . CAD (coronary artery disease)   . Chronic kidney disease    stage 3  . Chronic systolic CHF (congestive heart failure) (HCC)   . Depression   . Hypertension     Past Surgical History:  Procedure Laterality Date  . APPENDECTOMY    . HERNIA REPAIR Right    inguinal  . JOINT REPLACEMENT Right    knee  . TOTAL HIP ARTHROPLASTY Right 01/21/2020   Procedure: TOTAL HIP ARTHROPLASTY;  Surgeon: Donato Heinz, MD;  Location: [facility] ORS;  Service: Orthopedics;  Laterality: Right;    There were no vitals filed for this visit.   Subjective Assessment - 04/27/20 1520    Subjective Pt reports no pain in R hip but reports he has pain in L knee and shoulders.           Ther-Ex Oswaldo Conroy L2 seat 8, UE 7  for low level strengthening with cuing to maintain SPM over 40  STS from elevated nustep without BUE flexion with wand AAROM to <90d 2x 8 with 3# AW on RLE  Standing alt hip flex beside treadmill  2x 12 with 3# AW and cuing for upright posture with good carry over; standing rests between sets   Standing BUE support alt  abd 2x 10 with cuing needed for posture with decent carry over, difficulty abducting LLE d/t knee pain; standing rests between      Gait training  52ft with 3# AW on RLE for increased RLE propulsion with success; min cuing at end of amb for increased RLE foot clearance Supervision - CGA for safety  86ft backwards walking with walker behind and CGA for hip extension                          PT Education - 04/27/20 1609    Education Details therex form/technique, gait training    Person(s) Educated Patient    Methods Explanation;Demonstration;Verbal cues    Comprehension Verbalized understanding;Returned demonstration;Verbal cues required            PT Short Term Goals - 03/30/20 1047      PT SHORT TERM GOAL #1   Title Pt will be independent with HEP in order to improve strength and decrease back pain in order to improve pain-free function at home and work.    Baseline 03/29/20 HEP given    Time 4    Period Weeks  Status New      PT SHORT TERM GOAL #2   Title Pt will increase to at least 0.77m/s in order to demonstrate clinically significant safe household and limited community ambulation    Baseline 03/30/20 0.29m/s             PT Long Term Goals - 03/30/20 1050      PT LONG TERM GOAL #1   Title Pt will increase to at least 71m/s in order to demonstrate clinically significant decreased fall risk in community ambulation.    Baseline 03/30/20 0.66m/s    Time 8    Period Weeks    Status New      PT LONG TERM GOAL #2   Title Pt will demonstrate ind bed mobility, and basic transfers to increase ind and safety at home    Baseline 03/30/20 chair <> chair modI with RW superivison; STS modI with supervision; rolling modA needed; supine <> sit modI supervision    Time 8    Period Weeks    Status New      PT LONG TERM GOAL #4   Title Pt will increase by at least 19m (66ft) in order to demonstrate clinically significant improvement in  cardiopulmonary and muscular endurance and community ambulation    Baseline 03/30/20 42ft    Time 8    Period Weeks    Status New      PT LONG TERM GOAL #5   Title Patient will increase FOTO score to 46 to demonstrate predicted increase in functional mobility to complete ADLs    Baseline 03/30/20 20    Time 8    Period Weeks    Status New                 Plan - 04/27/20 1611    Clinical Impression Statement PT continues with therex program for increased mobility and BLE strength. Pt continues to have difficulty with functional activity that is limited by shoulder and L knee pain. Pt is able to ambulate forwards and backwards with increased foot clearance and upright posture with less cues. PT will continue to progress as able.    Personal Factors and Comorbidities Comorbidity 3+;Comorbidity 1;Comorbidity 2;Age;Past/Current Experience;Fitness;Time since onset of injury/illness/exacerbation    Comorbidities CAD, CHF, HTN, CKD, arthritis, anxiety/depression    Examination-Activity Limitations Bathing;Bed Mobility;Dressing;Lift;Carry;Squat;Transfers;Stairs;Stand;Reach Overhead    Stability/Clinical Decision Making Evolving/Moderate complexity    Clinical Decision Making Moderate    Rehab Potential Good    PT Frequency 2x / week    PT Duration 8 weeks    PT Treatment/Interventions Electrical Stimulation;ADLs/Self Care Home Management;Moist Heat;Cryotherapy;Iontophoresis 4mg /ml Dexamethasone;Traction;Ultrasound;Gait training;DME Instruction;Therapeutic activities;Functional mobility training;Stair training;Therapeutic exercise;Balance training;Neuromuscular re-education;Patient/family education;Passive range of motion;Dry needling;Manual techniques;Energy conservation;Visual/perceptual remediation/compensation;Spinal Manipulations;Joint Manipulations    PT Next Visit Plan HEP review, gait training, energy conservation, transfers    PT Home Exercise Plan in addition to chair exercises:  STS, heel raises, narrow BOS standing, amb "across home" 2-3x/day    Consulted and Agree with Plan of Care Patient           Patient will benefit from skilled therapeutic intervention in order to improve the following deficits and impairments:  Cardiopulmonary status limiting activity, Abnormal gait, Decreased activity tolerance, Decreased endurance, Decreased knowledge of use of DME, Decreased range of motion, Decreased mobility, Decreased balance, Decreased coordination, Decreased safety awareness, Difficulty walking, Increased muscle spasms, Decreased strength, Increased fascial restricitons, Impaired UE functional use, Improper body mechanics, Pain, Postural dysfunction, Impaired flexibility  Visit Diagnosis: Pain in right hip  Stiffness of right hip, not elsewhere classified  Other abnormalities of gait and mobility  Repeated falls     Problem List Patient Active Problem List   Diagnosis Date Noted  . Hx of total hip arthroplasty, right 01/21/2020  . Sepsis (HCC) 03/20/2019  . Chronic systolic CHF (congestive heart failure) (HCC) 03/20/2019  . CAD (coronary artery disease) 03/20/2019  . HTN (hypertension) 03/20/2019  . Acute on chronic renal failure (HCC) 03/20/2019  . Severe sepsis (HCC) 03/20/2019  . CKD (chronic kidney disease) stage 3, GFR 30-59 ml/min (HCC) 02/11/2019  . Right leg pain 12/13/2018  . Acute exacerbation of CHF (congestive heart failure) (HCC) 12/09/2018  . Benign essential hypertension 01/12/2014  . Gout 01/12/2014    Hilda Lias DPT [facility], SPT Hilda Lias 04/27/2020, 5:24 PM  [place] [facility] PHYSICAL AND SPORTS MEDICINE [address] [place], [address] Phone: [phone]   Fax:  [phone]  Name: Jerome [name]. MRN: [id number] Date of Birth: 1934-07-04

## 2020-04-29 ENCOUNTER — Ambulatory Visit: Payer: Medicare Other | Admitting: Physical Therapy

## 2020-05-04 ENCOUNTER — Encounter: Payer: Self-pay | Admitting: Physical Therapy

## 2020-05-04 ENCOUNTER — Other Ambulatory Visit: Payer: Self-pay

## 2020-05-04 ENCOUNTER — Ambulatory Visit: Payer: Medicare Other | Admitting: Physical Therapy

## 2020-05-04 DIAGNOSIS — M25551 Pain in right hip: Secondary | ICD-10-CM

## 2020-05-04 DIAGNOSIS — M25651 Stiffness of right hip, not elsewhere classified: Secondary | ICD-10-CM

## 2020-05-04 DIAGNOSIS — R2689 Other abnormalities of gait and mobility: Secondary | ICD-10-CM

## 2020-05-04 DIAGNOSIS — R296 Repeated falls: Secondary | ICD-10-CM

## 2020-05-04 NOTE — Therapy (Signed)
[facility] [address] [place], [address] Phone: [phone]   Fax:  [phone]  Physical Therapy Treatment  Patient Details  Name: Jerome [name]. MRN: [id number] Date of Birth: [birth year] No data recorded  Encounter Date: 05/04/2020   PT End of Session - 05/04/20 1655    Visit Number 7    Number of Visits 17    Date for PT Re-Evaluation 05/28/20    Authorization - Visit Number 7    Authorization - Number of Visits 10    PT Start Time 0230    PT Stop Time 0310    PT Time Calculation (min) 40 min    Equipment Utilized During Treatment Gait belt    Activity Tolerance Patient tolerated treatment well;Patient limited by fatigue    Behavior During Therapy WFL for tasks assessed/performed           Past Medical History:  Diagnosis Date  . Anxiety   . Arthritis   . CAD (coronary artery disease)   . Chronic kidney disease    stage 3  . Chronic systolic CHF (congestive heart failure) (HCC)   . Depression   . Hypertension     Past Surgical History:  Procedure Laterality Date  . APPENDECTOMY    . HERNIA REPAIR Right    inguinal  . JOINT REPLACEMENT Right    knee  . TOTAL HIP ARTHROPLASTY Right 01/21/2020   Procedure: TOTAL HIP ARTHROPLASTY;  Surgeon: Donato Heinz, MD;  Location: [facility] ORS;  Service: Orthopedics;  Laterality: Right;    There were no vitals filed for this visit.   Subjective Assessment - 05/04/20 1435    Subjective Patient reports no pain or problems in R hip since surgery and most of his pain in his L knee especially with weightbearing activities.    Pertinent History Pt presents s/p R THA posterior approach 01/21/20. Completed 3 weeks of rehab at [facility] following, has not completed HHPT. [employer] WC into clinic, reports he uses his WC if he is going in the community for appts, and uses RW at home. Prior to surgery he was using the WC and RW because he was not  getting around well d/t hip pain, had been using a cane since R knee replacement "years ago" but would like to get back to walking without AD. Patient reports he has the ability to drive, but has not drove since hip replacement. His son lives with him, drives him to appts, completing grocery shopping, cooking, and cleaning. Patient has an aid that comes 3x/week and bathes him sitting EOB, and dresses him. The remaining days he only wears a tshirt and underwear, is unable to fully dress without assistance. Prior to surgery hospice was coming in and bathing him as well, reports he has not been getting in his shower for a "long time" because he has a tub/shower unit and no shower seat has been able to fit in his shower. Patient is able to transfer bed <> chair, STS, and complete bed mobility modI. Pt has ramp to enter his home and no stairs inside. Reports he completes 1-2 48ft amb with RW/day and completing sitting exercises occassionally. Pt is retired and reports he has no hobbies he is able to complete anymore. "Many years ago I enjoyed hunting/fishing, and playing softball". He would like to take his own showers, but reports it has been a while since he could do so d/t balance deficits as  well. Denies pain or sensation deficits of RLE at this time, some soreness of LLE. Pt denies N/V, B&B changes, unexplained weight fluctuation, saddle paresthesia, fever, night sweats, or unrelenting night pain at this time.    Limitations Lifting;Standing;Sitting;Walking    How long can you sit comfortably? unlimited; sits most of the day    How long can you stand comfortably? >32mins    How long can you walk comfortably? >42mins    Patient Stated Goals take my own showers and walk better    Currently in Pain? Yes    Pain Score 5     Pain Location Knee    Pain Orientation Left    Pain Onset More than a month ago             Ther-Ex [employer] L2 seat 8, UE 7  for low level strengthening with cuing to  maintain SPM over 40   STS from elevated nustep without BUE flexion with wand AAROM to <90d 2x 8 with 3# AW on RLE   Standing alt hip flex beside treadmill  2x 12 with 3# AW and cuing for upright posture with good carry over; standing rests between sets    Standing BUE support alt abd 2x 10 with cuing needed for posture with decent carry over, difficulty abducting LLE d/t knee pain; standing rests between    Bilat heel raises w/3# AW on RLE  2x12     Gait training  Sidestepping on treadmill 3x23ft w/3#AW on RLE, resting breaks between sets                           PT Education - 05/04/20 1651    Education Details therex form/technique, gait training    Person(s) Educated Patient    Methods Explanation;Demonstration;Verbal cues    Comprehension Verbalized understanding;Returned demonstration;Verbal cues required            PT Short Term Goals - 03/30/20 1047      PT SHORT TERM GOAL #1   Title Pt will be independent with HEP in order to improve strength and decrease back pain in order to improve pain-free function at home and work.    Baseline 03/29/20 HEP given    Time 4    Period Weeks    Status New      PT SHORT TERM GOAL #2   Title Pt will increase to at least 0.65m/s in order to demonstrate clinically significant safe household and limited community ambulation    Baseline 03/30/20 0.38m/s             PT Long Term Goals - 03/30/20 1050      PT LONG TERM GOAL #1   Title Pt will increase to at least 52m/s in order to demonstrate clinically significant decreased fall risk in community ambulation.    Baseline 03/30/20 0.75m/s    Time 8    Period Weeks    Status New      PT LONG TERM GOAL #2   Title Pt will demonstrate ind bed mobility, and basic transfers to increase ind and safety at home    Baseline 03/30/20 chair <> chair modI with RW superivison; STS modI with supervision; rolling modA needed; supine <> sit modI supervision     Time 8    Period Weeks    Status New      PT LONG TERM GOAL #4   Title Pt will increase  by at least 17m (85ft) in order to demonstrate clinically significant improvement in cardiopulmonary and muscular endurance and community ambulation    Baseline 03/30/20 83ft    Time 8    Period Weeks    Status New      PT LONG TERM GOAL #5   Title Patient will increase FOTO score to 46 to demonstrate predicted increase in functional mobility to complete ADLs    Baseline 03/30/20 20    Time 8    Period Weeks    Status New                 Plan - 05/04/20 1656    Clinical Impression Statement Pt continues to have difficulty with BLE mobility and strength, limited mostly by L knee pain. Pt able to tolerate standing activities but needed additional breaks today. Patient has increased difficulty with STS  due to L knee pain. PT will continue to progress as able.    Personal Factors and Comorbidities Comorbidity 3+;Comorbidity 1;Comorbidity 2;Age;Past/Current Experience;Fitness;Time since onset of injury/illness/exacerbation    Comorbidities CAD, CHF, HTN, CKD, arthritis, anxiety/depression    Examination-Activity Limitations Bathing;Bed Mobility;Dressing;Lift;Carry;Squat;Transfers;Stairs;Stand;Reach Overhead    Examination-Participation Restrictions Community Activity;Laundry;Driving;Yard Work;Meal Prep;Shop    Stability/Clinical Decision Making Evolving/Moderate complexity    Clinical Decision Making Moderate    Rehab Potential Good    PT Frequency 2x / week    PT Duration 8 weeks    PT Treatment/Interventions Electrical Stimulation;ADLs/Self Care Home Management;Moist Heat;Cryotherapy;Iontophoresis 4mg /ml Dexamethasone;Traction;Ultrasound;Gait training;DME Instruction;Therapeutic activities;Functional mobility training;Stair training;Therapeutic exercise;Balance training;Neuromuscular re-education;Patient/family education;Passive range of motion;Dry needling;Manual techniques;Energy  conservation;Visual/perceptual remediation/compensation;Spinal Manipulations;Joint Manipulations    PT Next Visit Plan HEP review, gait training, energy conservation, transfers    PT Home Exercise Plan in addition to chair exercises: STS, heel raises, narrow BOS standing, amb "across home" 2-3x/day    Consulted and Agree with Plan of Care Patient           Patient will benefit from skilled therapeutic intervention in order to improve the following deficits and impairments:  Cardiopulmonary status limiting activity, Abnormal gait, Decreased activity tolerance, Decreased endurance, Decreased knowledge of use of DME, Decreased range of motion, Decreased mobility, Decreased balance, Decreased coordination, Decreased safety awareness, Difficulty walking, Increased muscle spasms, Decreased strength, Increased fascial restricitons, Impaired UE functional use, Improper body mechanics, Pain, Postural dysfunction, Impaired flexibility  Visit Diagnosis: Pain in right hip  Stiffness of right hip, not elsewhere classified  Other abnormalities of gait and mobility  Repeated falls     Problem List Patient Active Problem List   Diagnosis Date Noted  . Hx of total hip arthroplasty, right 01/21/2020  . Sepsis (HCC) 03/20/2019  . Chronic systolic CHF (congestive heart failure) (HCC) 03/20/2019  . CAD (coronary artery disease) 03/20/2019  . HTN (hypertension) 03/20/2019  . Acute on chronic renal failure (HCC) 03/20/2019  . Severe sepsis (HCC) 03/20/2019  . CKD (chronic kidney disease) stage 3, GFR 30-59 ml/min (HCC) 02/11/2019  . Right leg pain 12/13/2018  . Acute exacerbation of CHF (congestive heart failure) (HCC) 12/09/2018  . Benign essential hypertension 01/12/2014  . Gout 01/12/2014    01/14/2014 DPT [facility], SPT [facility] 05/04/2020, 5:59 PM  [facility] PHYSICAL AND SPORTS MEDICINE [address], [place] Phone:  [phone]   Fax:  [phone]  Name: Jerome [name]. MRN: [name] Date of Birth: 1935/02/24

## 2020-05-06 ENCOUNTER — Ambulatory Visit: Payer: Medicare Other | Admitting: Physical Therapy

## 2020-05-06 ENCOUNTER — Encounter: Payer: Self-pay | Admitting: Physical Therapy

## 2020-05-06 ENCOUNTER — Other Ambulatory Visit: Payer: Self-pay

## 2020-05-06 DIAGNOSIS — R2689 Other abnormalities of gait and mobility: Secondary | ICD-10-CM

## 2020-05-06 DIAGNOSIS — G8929 Other chronic pain: Secondary | ICD-10-CM

## 2020-05-06 DIAGNOSIS — M25651 Stiffness of right hip, not elsewhere classified: Secondary | ICD-10-CM

## 2020-05-06 DIAGNOSIS — R296 Repeated falls: Secondary | ICD-10-CM

## 2020-05-06 DIAGNOSIS — M25551 Pain in right hip: Secondary | ICD-10-CM | POA: Diagnosis not present

## 2020-05-06 NOTE — Therapy (Signed)
[facility] PH[facility] [address], [place], [address] Phone: [phone]   Fax:  [phone]  Physical Therapy Treatment  Patient Details  Name: Jerome [name]. MRN: [id number] Date of Birth: 1934/08/18 No data recorded  Encounter Date: 05/06/2020   PT End of Session - 05/06/20 1508    Visit Number 8    Number of Visits 17    Date for PT Re-Evaluation 05/28/20    Authorization - Visit Number 8    Authorization - Number of Visits 10    PT Start Time 0225    PT Stop Time 0309    PT Time Calculation (min) 44 min    Equipment Utilized During Treatment Gait belt    Activity Tolerance Patient tolerated treatment well;Patient limited by fatigue    Behavior During Therapy WFL for tasks assessed/performed           Past Medical History:  Diagnosis Date  . Anxiety   . Arthritis   . CAD (coronary artery disease)   . Chronic kidney disease    stage 3  . Chronic systolic CHF (congestive heart failure) (HCC)   . Depression   . Hypertension     Past Surgical History:  Procedure Laterality Date  . APPENDECTOMY    . HERNIA REPAIR Right    inguinal  . JOINT REPLACEMENT Right    knee  . TOTAL HIP ARTHROPLASTY Right 01/21/2020   Procedure: TOTAL HIP ARTHROPLASTY;  Surgeon: Donato Heinz, MD;  Location: [facility] ORS;  Service: Orthopedics;  Laterality: Right;    There were no vitals filed for this visit.   Subjective Assessment - 05/06/20 1429    Subjective Patient reports pain in his L knee when weightbearing on it and is having a hard time walking around due to the pain.    Pertinent History Pt presents s/p R THA posterior approach 01/21/20. Completed 3 weeks of rehab at [facility] following, has not completed HHPT. [facility] WC into clinic, reports he uses his WC if he is going in the community for appts, and uses RW at home. Prior to surgery he was using the WC and RW because he was not getting around well  d/t hip pain, had been using a cane since R knee replacement "years ago" but would like to get back to walking without AD. Patient reports he has the ability to drive, but has not drove since hip replacement. His son lives with him, drives him to appts, completing grocery shopping, cooking, and cleaning. Patient has an aid that comes 3x/week and bathes him sitting EOB, and dresses him. The remaining days he only wears a tshirt and underwear, is unable to fully dress without assistance. Prior to surgery hospice was coming in and bathing him as well, reports he has not been getting in his shower for a "long time" because he has a tub/shower unit and no shower seat has been able to fit in his shower. Patient is able to transfer bed <> chair, STS, and complete bed mobility modI. Pt has ramp to enter his home and no stairs inside. Reports he completes 1-2 67ft amb with RW/day and completing sitting exercises occassionally. Pt is retired and reports he has no hobbies he is able to complete anymore. "Many years ago I enjoyed hunting/fishing, and playing softball". He would like to take his own showers, but reports it has been a while since he could do so d/t balance deficits as well.  Denies pain or sensation deficits of RLE at this time, some soreness of LLE. Pt denies N/V, B&B changes, unexplained weight fluctuation, saddle paresthesia, fever, night sweats, or unrelenting night pain at this time.    Limitations Lifting;Standing;Sitting;Walking    How long can you sit comfortably? unlimited; sits most of the day    How long can you walk comfortably? >7mins    Patient Stated Goals take my own showers and walk better    Currently in Pain? Yes    Pain Score 7     Pain Location Knee    Pain Orientation Left    Pain Onset More than a month ago           Ther-Ex  Nustep L2 seat 8, UE 7  for low level strengthening with cuing to maintain SPM over 40   STS from elevated nustep without pushing off of chair,  cues to push hips forward and stand tall and control lowering into chair 2x10, pt had increased difficulty with full STS, modified to light taps on chair instead    Standing alternate step ups on 3inch step 2x10   Standing BUE support alt abd 2x 10 with cuing needed for posture with decent carry over, difficulty when standing on LLE; standing rests between      Bilat heel raises w/3# AW on RLE  2x12   Gait training  Forwards and backwards walking with RW 2x23ft                              PT Education - 05/06/20 1508    Education Details therex form/technique, gait training    Person(s) Educated Patient    Methods Explanation;Demonstration;Verbal cues    Comprehension Verbalized understanding;Returned demonstration;Verbal cues required            PT Short Term Goals - 03/30/20 1047      PT SHORT TERM GOAL #1   Title Pt will be independent with HEP in order to improve strength and decrease back pain in order to improve pain-free function at home and work.    Baseline 03/29/20 HEP given    Time 4    Period Weeks    Status New      PT SHORT TERM GOAL #2   Title Pt will increase to at least 0.51m/s in order to demonstrate clinically significant safe household and limited community ambulation    Baseline 03/30/20 0.64m/s             PT Long Term Goals - 03/30/20 1050      PT LONG TERM GOAL #1   Title Pt will increase to at least 34m/s in order to demonstrate clinically significant decreased fall risk in community ambulation.    Baseline 03/30/20 0.68m/s    Time 8    Period Weeks    Status New      PT LONG TERM GOAL #2   Title Pt will demonstrate ind bed mobility, and basic transfers to increase ind and safety at home    Baseline 03/30/20 chair <> chair modI with RW superivison; STS modI with supervision; rolling modA needed; supine <> sit modI supervision    Time 8    Period Weeks    Status New      PT LONG TERM GOAL #4   Title  Pt will increase by at least 18m (64ft) in order to demonstrate clinically significant improvement in  cardiopulmonary and muscular endurance and community ambulation    Baseline 03/30/20 40ft    Time 8    Period Weeks    Status New      PT LONG TERM GOAL #5   Title Patient will increase FOTO score to 46 to demonstrate predicted increase in functional mobility to complete ADLs    Baseline 03/30/20 20    Time 8    Period Weeks    Status New                 Plan - 05/06/20 1509    Clinical Impression Statement Pt is able to follow therex program to increase BLE strength but continues to have trouble with L knee pain. Pt states he has been applying heat to his knee but was educated to try ice to reduce swelling. Pt had difficulty with STS exercises so it was modified to lightly tap on chair instead of sitting all the way back down. PT will progress as able.    Personal Factors and Comorbidities Comorbidity 3+;Comorbidity 1;Comorbidity 2;Age;Past/Current Experience;Fitness;Time since onset of injury/illness/exacerbation    Comorbidities CAD, CHF, HTN, CKD, arthritis, anxiety/depression    Examination-Activity Limitations Bathing;Bed Mobility;Dressing;Lift;Carry;Squat;Transfers;Stairs;Stand;Reach Overhead    Examination-Participation Restrictions Community Activity;Laundry;Driving;Yard Work;Meal Prep;Shop    Stability/Clinical Decision Making Evolving/Moderate complexity    Clinical Decision Making Moderate    Rehab Potential Good    PT Frequency 2x / week    PT Duration 8 weeks    PT Treatment/Interventions Electrical Stimulation;ADLs/Self Care Home Management;Moist Heat;Cryotherapy;Iontophoresis 4mg /ml Dexamethasone;Traction;Ultrasound;Gait training;DME Instruction;Therapeutic activities;Functional mobility training;Stair training;Therapeutic exercise;Balance training;Neuromuscular re-education;Patient/family education;Passive range of motion;Dry needling;Manual techniques;Energy  conservation;Visual/perceptual remediation/compensation;Spinal Manipulations;Joint Manipulations    PT Next Visit Plan HEP review, gait training, energy conservation, transfers    PT Home Exercise Plan in addition to chair exercises: STS, heel raises, narrow BOS standing, amb "across home" 2-3x/day    Consulted and Agree with Plan of Care Patient           Patient will benefit from skilled therapeutic intervention in order to improve the following deficits and impairments:  Cardiopulmonary status limiting activity, Abnormal gait, Decreased activity tolerance, Decreased endurance, Decreased knowledge of use of DME, Decreased range of motion, Decreased mobility, Decreased balance, Decreased coordination, Decreased safety awareness, Difficulty walking, Increased muscle spasms, Decreased strength, Increased fascial restricitons, Impaired UE functional use, Improper body mechanics, Pain, Postural dysfunction, Impaired flexibility  Visit Diagnosis: Other abnormalities of gait and mobility  Chronic pain of left knee  Stiffness of right hip, not elsewhere classified  Repeated falls     Problem List Patient Active Problem List   Diagnosis Date Noted  . Hx of total hip arthroplasty, right 01/21/2020  . Sepsis (HCC) 03/20/2019  . Chronic systolic CHF (congestive heart failure) (HCC) 03/20/2019  . CAD (coronary artery disease) 03/20/2019  . HTN (hypertension) 03/20/2019  . Acute on chronic renal failure (HCC) 03/20/2019  . Severe sepsis (HCC) 03/20/2019  . CKD (chronic kidney disease) stage 3, GFR 30-59 ml/min (HCC) 02/11/2019  . Right leg pain 12/13/2018  . Acute exacerbation of CHF (congestive heart failure) (HCC) 12/09/2018  . Benign essential hypertension 01/12/2014  . Gout 01/12/2014     01/14/2014 DPT [facility], SPT [facility] 05/06/2020, 5:06 PM  [facility] PHYSICAL AND SPORTS MEDICINE [address],  [place] Phone: [phone]   Fax:  [phone]  Name: Jerome [name]. MRN: [name] Date of Birth: 1934-11-09

## 2020-05-11 ENCOUNTER — Encounter: Payer: Self-pay | Admitting: Physical Therapy

## 2020-05-11 ENCOUNTER — Ambulatory Visit: Payer: Medicare Other | Admitting: Physical Therapy

## 2020-05-11 ENCOUNTER — Other Ambulatory Visit: Payer: Self-pay

## 2020-05-11 DIAGNOSIS — M25562 Pain in left knee: Secondary | ICD-10-CM

## 2020-05-11 DIAGNOSIS — M25651 Stiffness of right hip, not elsewhere classified: Secondary | ICD-10-CM

## 2020-05-11 DIAGNOSIS — R296 Repeated falls: Secondary | ICD-10-CM

## 2020-05-11 DIAGNOSIS — M25551 Pain in right hip: Secondary | ICD-10-CM | POA: Diagnosis not present

## 2020-05-11 DIAGNOSIS — G8929 Other chronic pain: Secondary | ICD-10-CM

## 2020-05-11 DIAGNOSIS — R2689 Other abnormalities of gait and mobility: Secondary | ICD-10-CM

## 2020-05-11 NOTE — Therapy (Signed)
[facility] PH[facility] MEDICINE [address] [place], [address] Phone: [phone]   Fax:  [phone]  Physical Therapy Treatment  Patient Details  Name: Jerome [name]. MRN: [id number] Date of Birth: April 04, 1935 No data recorded  Encounter Date: 05/11/2020   PT End of Session - 05/11/20 1517    Visit Number 9    Number of Visits 17    Date for PT Re-Evaluation 05/28/20    Authorization - Visit Number 9    Authorization - Number of Visits 10    PT Start Time 0230    PT Stop Time 0311    PT Time Calculation (min) 41 min    Equipment Utilized During Treatment Gait belt    Activity Tolerance Patient tolerated treatment well;Patient limited by fatigue    Behavior During Therapy WFL for tasks assessed/performed           Past Medical History:  Diagnosis Date  . Anxiety   . Arthritis   . CAD (coronary artery disease)   . Chronic kidney disease    stage 3  . Chronic systolic CHF (congestive heart failure) (HCC)   . Depression   . Hypertension     Past Surgical History:  Procedure Laterality Date  . APPENDECTOMY    . HERNIA REPAIR Right    inguinal  . JOINT REPLACEMENT Right    knee  . TOTAL HIP ARTHROPLASTY Right 01/21/2020   Procedure: TOTAL HIP ARTHROPLASTY;  Surgeon: Donato Heinz, MD;  Location: [facility] ORS;  Service: Orthopedics;  Laterality: Right;    There were no vitals filed for this visit.   Subjective Assessment - 05/11/20 1435    Subjective Patient reports walking around the house has gotten easier but when he sits down for too long his L knee gets stiff and then he has a hard time getting up and moving around. Pt reports he is waiting for his appointment in late December to see if he needs a TKA to fix his L knee. Pt states his R hip is doing great and he has no problems with it since the surgery.    Pertinent History Pt presents s/p R THA posterior approach 01/21/20. Completed 3 weeks of rehab at  [facility] following, has not completed HHPT. Propels WC into clinic, reports he uses his WC if he is going in the community for appts, and uses RW at home. Prior to surgery he was using the WC and RW because he was not getting around well d/t hip pain, had been using a cane since R knee replacement "years ago" but would like to get back to walking without AD. Patient reports he has the ability to drive, but has not drove since hip replacement. His son lives with him, drives him to appts, completing grocery shopping, cooking, and cleaning. Patient has an aid that comes 3x/week and bathes him sitting EOB, and dresses him. The remaining days he only wears a tshirt and underwear, is unable to fully dress without assistance. Prior to surgery hospice was coming in and bathing him as well, reports he has not been getting in his shower for a "long time" because he has a tub/shower unit and no shower seat has been able to fit in his shower. Patient is able to transfer bed <> chair, STS, and complete bed mobility modI. Pt has ramp to enter his home and no stairs inside. Reports he completes 1-2 65ft amb with RW/day and completing  sitting exercises occassionally. Pt is retired and reports he has no hobbies he is able to complete anymore. "Many years ago I enjoyed hunting/fishing, and playing softball". He would like to take his own showers, but reports it has been a while since he could do so d/t balance deficits as well. Denies pain or sensation deficits of RLE at this time, some soreness of LLE. Pt denies N/V, B&B changes, unexplained weight fluctuation, saddle paresthesia, fever, night sweats, or unrelenting night pain at this time.    Limitations Lifting;Standing;Sitting;Walking    How long can you sit comfortably? unlimited; sits most of the day    How long can you stand comfortably? >665mins    How long can you walk comfortably? >285mins    Patient Stated Goals take my own showers and walk better    Currently in  Pain? Yes    Pain Score 6     Pain Location Knee    Pain Orientation Left    Pain Onset More than a month ago          Ther-Ex  Nustep L2 seat 8, UE 7 5mins for low level strengthening with cuing to maintain SPM over 40   Mini squats with taps on elevated nustep without pushing off of chair 2x10. Cues to push hips down and back and stand up tall with ascend.    Calf raises 2x12   Ytheraball UE flexion and rotation 2x12   Gait training  Side stepping 3x475ft on side of treadmill  1 lap around the clinic with cues to clear feet and increase speed                    PT Education - 05/11/20 1516    Education Details therex form/technique, gait training    Person(s) Educated Patient    Methods Explanation;Demonstration;Verbal cues    Comprehension Returned demonstration;Verbalized understanding;Verbal cues required            PT Short Term Goals - 03/30/20 1047      PT SHORT TERM GOAL #1   Title Pt will be independent with HEP in order to improve strength and decrease back pain in order to improve pain-free function at home and work.    Baseline 03/29/20 HEP given    Time 4    Period Weeks    Status New      PT SHORT TERM GOAL #2   Title Pt will increase 10MWT to at least 0.9762m/s in order to demonstrate clinically significant safe household and limited community ambulation    Baseline 03/30/20 0.5859m/s             PT Long Term Goals - 03/30/20 1050      PT LONG TERM GOAL #1   Title Pt will increase 10MWT to at least 373m/s in order to demonstrate clinically significant decreased fall risk in community ambulation.    Baseline 03/30/20 0.9759m/s    Time 8    Period Weeks    Status New      PT LONG TERM GOAL #2   Title Pt will demonstrate ind bed mobility, and basic transfers to increase ind and safety at home    Baseline 03/30/20 chair <> chair modI with RW superivison; STS modI with supervision; rolling modA needed; supine <> sit modI supervision     Time 8    Period Weeks    Status New      PT LONG TERM GOAL #4   Title Pt  will increase by at least 51m (105ft) in order to demonstrate clinically significant improvement in cardiopulmonary and muscular endurance and community ambulation    Baseline 03/30/20 11ft    Time 8    Period Weeks    Status New      PT LONG TERM GOAL #5   Title Patient will increase FOTO score to 46 to demonstrate predicted increase in functional mobility to complete ADLs    Baseline 03/30/20 20    Time 8    Period Weeks    Status New                 Plan - 05/11/20 1518    Clinical Impression Statement Pt continues to have L knee pain that limits his ability to complete transfers and ambulate with ease. Pt is able to complete mini squats without UE support today. PT session continued with BLE strengthening as well as incorporating UE mobility, balance, and weight shifting activity with yellow theraball. PT will progress to include more standing activities as tolerable by patient.    Personal Factors and Comorbidities Comorbidity 3+;Comorbidity 1;Comorbidity 2;Age;Past/Current Experience;Fitness;Time since onset of injury/illness/exacerbation    Comorbidities CAD, CHF, HTN, CKD, arthritis, anxiety/depression    Examination-Activity Limitations Bathing;Bed Mobility;Dressing;Lift;Carry;Squat;Transfers;Stairs;Stand;Reach Overhead    Examination-Participation Restrictions Community Activity;Laundry;Driving;Yard Work;Meal Prep;Shop    Stability/Clinical Decision Making Evolving/Moderate complexity    Clinical Decision Making Moderate    Rehab Potential Good    PT Frequency 2x / week    PT Duration 8 weeks    PT Treatment/Interventions Electrical Stimulation;ADLs/Self Care Home Management;Moist Heat;Cryotherapy;Iontophoresis 4mg /ml Dexamethasone;Traction;Ultrasound;Gait training;DME Instruction;Therapeutic activities;Functional mobility training;Stair training;Therapeutic exercise;Balance  training;Neuromuscular re-education;Patient/family education;Passive range of motion;Dry needling;Manual techniques;Energy conservation;Visual/perceptual remediation/compensation;Spinal Manipulations;Joint Manipulations    PT Next Visit Plan HEP review, gait training, energy conservation, transfers    PT Home Exercise Plan in addition to chair exercises: STS, heel raises, narrow BOS standing, amb "across home" 2-3x/day    Consulted and Agree with Plan of Care Patient           Patient will benefit from skilled therapeutic intervention in order to improve the following deficits and impairments:  Cardiopulmonary status limiting activity, Abnormal gait, Decreased activity tolerance, Decreased endurance, Decreased knowledge of use of DME, Decreased range of motion, Decreased mobility, Decreased balance, Decreased coordination, Decreased safety awareness, Difficulty walking, Increased muscle spasms, Decreased strength, Increased fascial restricitons, Impaired UE functional use, Improper body mechanics, Pain, Postural dysfunction, Impaired flexibility  Visit Diagnosis: Chronic pain of left knee  Other abnormalities of gait and mobility  Stiffness of right hip, not elsewhere classified  Repeated falls     Problem List Patient Active Problem List   Diagnosis Date Noted  . Hx of total hip arthroplasty, right 01/21/2020  . Sepsis (HCC) 03/20/2019  . Chronic systolic CHF (congestive heart failure) (HCC) 03/20/2019  . CAD (coronary artery disease) 03/20/2019  . HTN (hypertension) 03/20/2019  . Acute on chronic renal failure (HCC) 03/20/2019  . Severe sepsis (HCC) 03/20/2019  . CKD (chronic kidney disease) stage 3, GFR 30-59 ml/min (HCC) 02/11/2019  . Right leg pain 12/13/2018  . Acute exacerbation of CHF (congestive heart failure) (HCC) 12/09/2018  . Benign essential hypertension 01/12/2014  . Gout 01/12/2014    01/14/2014 DPT [facility], SPT [facility] 05/11/2020, 4:13  PM  [facility] PHYSICAL AND SPORTS MEDICINE [address], [address], [place] Phone: [phone]   Fax:  [phone]  Name: Jerome [name]. MRN: [name]  Date of Birth: [birth year]

## 2020-05-18 ENCOUNTER — Ambulatory Visit: Payer: Medicare Other | Admitting: Physical Therapy

## 2020-05-20 ENCOUNTER — Ambulatory Visit: Payer: Medicare Other | Admitting: Physical Therapy

## 2020-05-25 ENCOUNTER — Encounter: Payer: Medicare Other | Admitting: Physical Therapy

## 2020-05-27 ENCOUNTER — Encounter: Payer: Medicare Other | Admitting: Physical Therapy

## 2020-06-01 ENCOUNTER — Ambulatory Visit: Payer: Medicare Other | Admitting: Physical Therapy

## 2020-06-03 ENCOUNTER — Ambulatory Visit: Payer: Medicare Other | Admitting: Physical Therapy

## 2020-06-03 IMAGING — DX PORTABLE CHEST - 1 VIEW
1 series · 1 of 1 positions shown · non-contrast
Comparison: None.

CLINICAL DATA: Shortness of breath. Fall.

EXAM:
PORTABLE CHEST 1 VIEW

[chest ap]
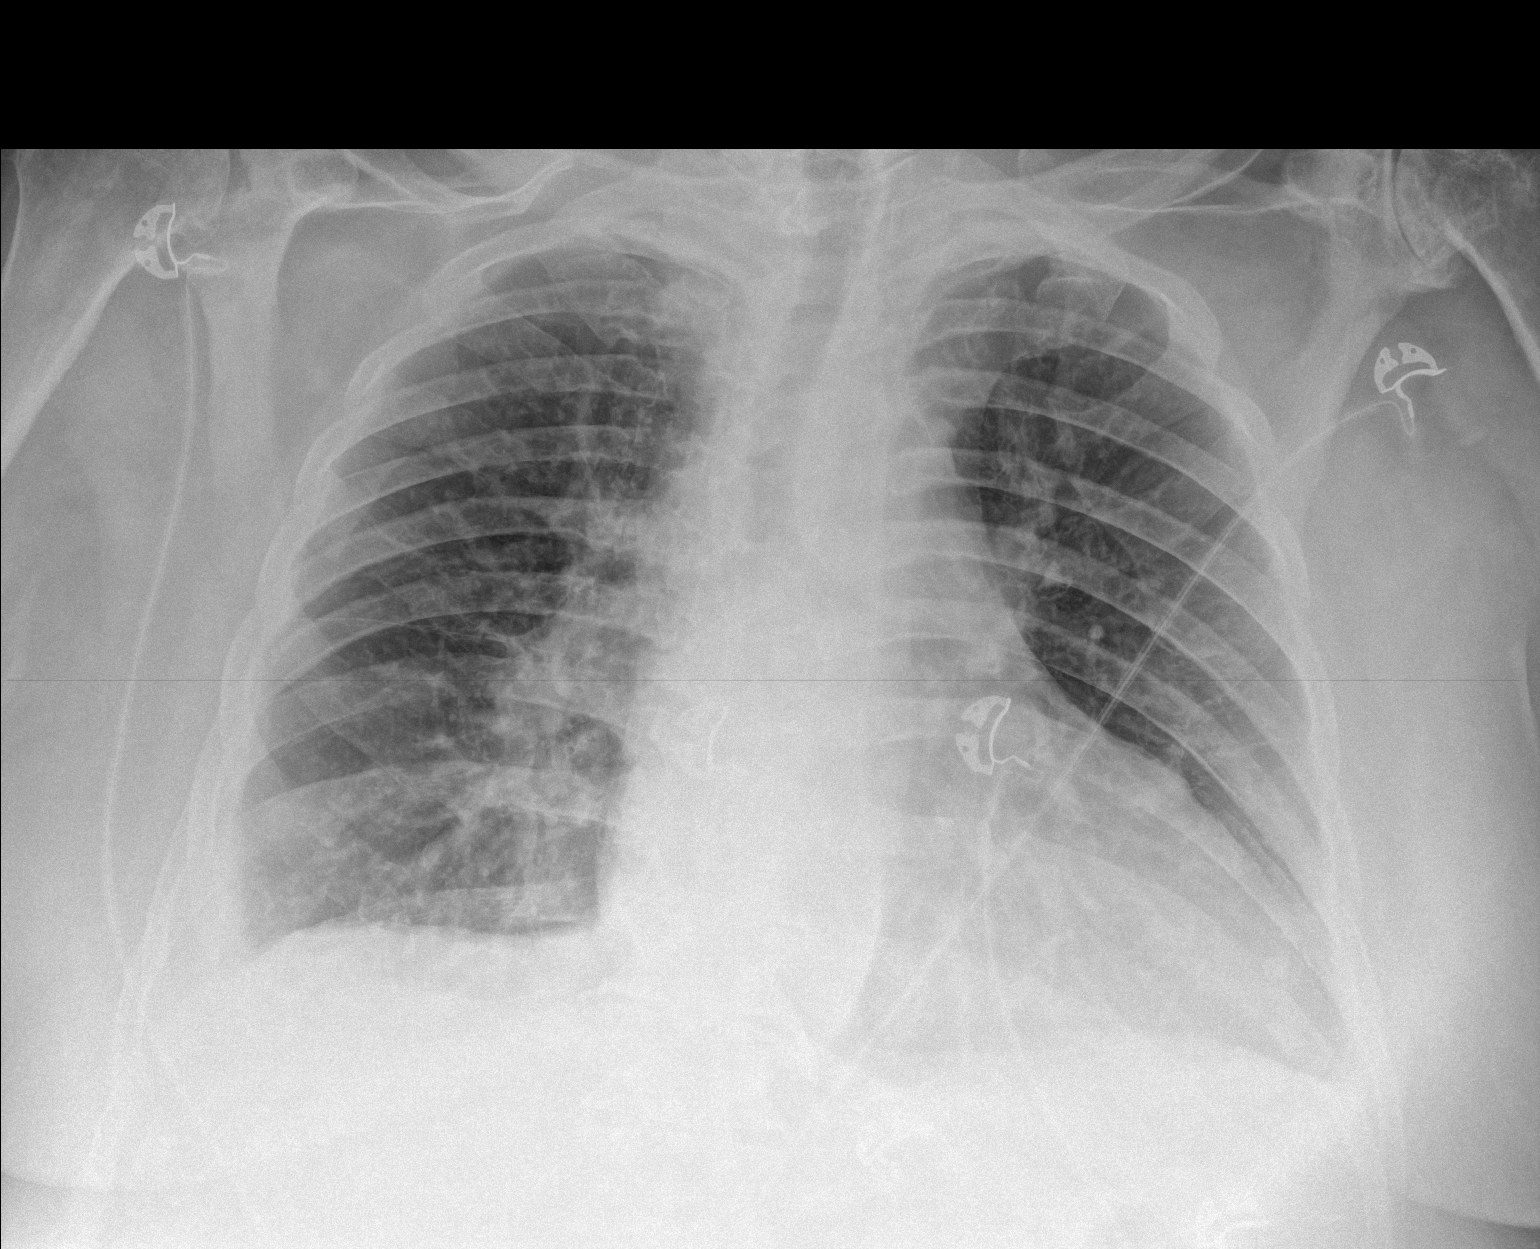

[1 of 1 positions shown; findings below may reference images not displayed]

FINDINGS: Mild cardiomegaly with normal mediastinal contours. Vascular
congestion. Suspect small pleural effusions. No focal airspace
disease. No pneumothorax. Chronic degenerative change of both
shoulders. No visualized rib fracture.
IMPRESSION: Mild cardiomegaly with vascular congestion. Suspect small pleural
effusions. Findings suspicious for fluid overload/mild CHF.

## 2020-09-13 IMAGING — CT CT HEAD W/O CM
3 series · 15 of 47 positions shown, 18 images · non-contrast
Comparison: None.

CLINICAL DATA: Altered level of consciousness, unexplained

EXAM:
CT HEAD WITHOUT CONTRAST
TECHNIQUE: Contiguous axial images were obtained from the base of the skull
through the vertex without intravenous contrast.

[Series 3: head wo · axial · 0.42mm/px · z∈[+386,+521]mm · 9 of 33 slices shown, 12 images]
[im 3/33  brain]
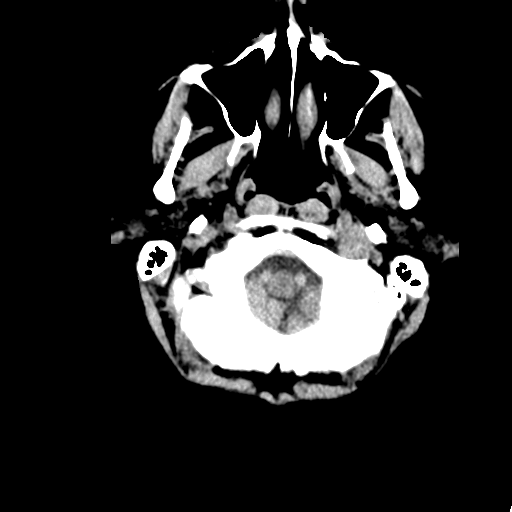
[im 3/33  bone]
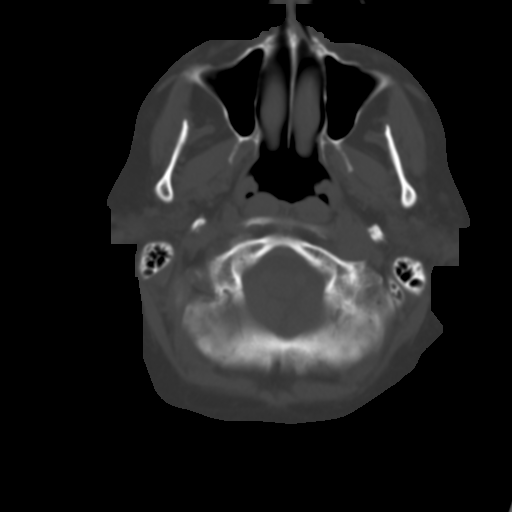
[im 6/33  brain]
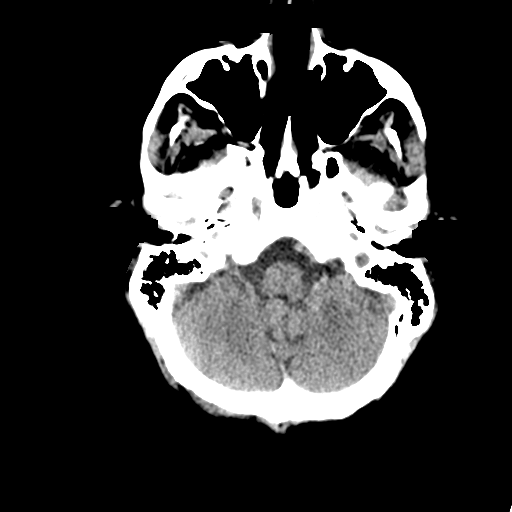
[im 9/33  brain]
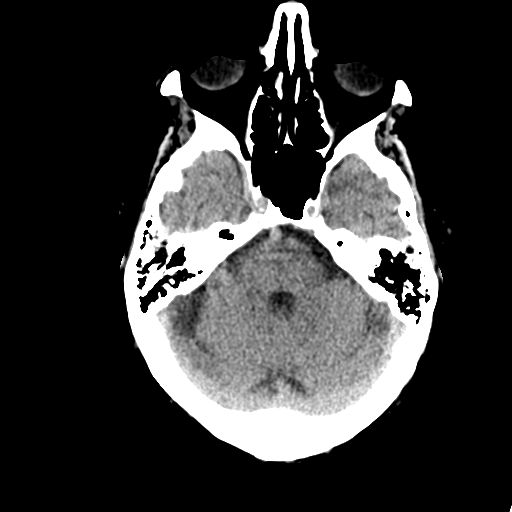
[im 13/33  brain]
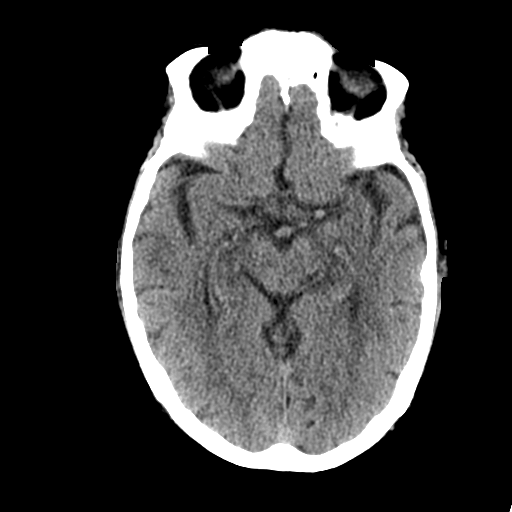
[im 17/33  brain]
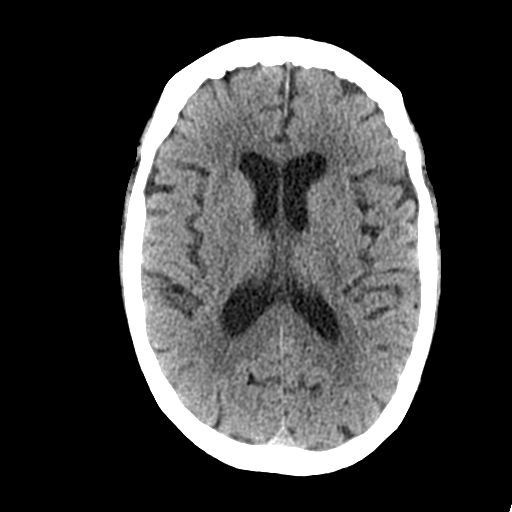
[im 17/33  bone]
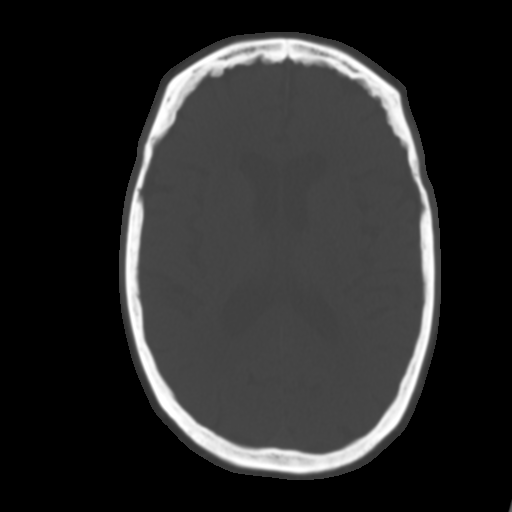
[im 20/33  brain]
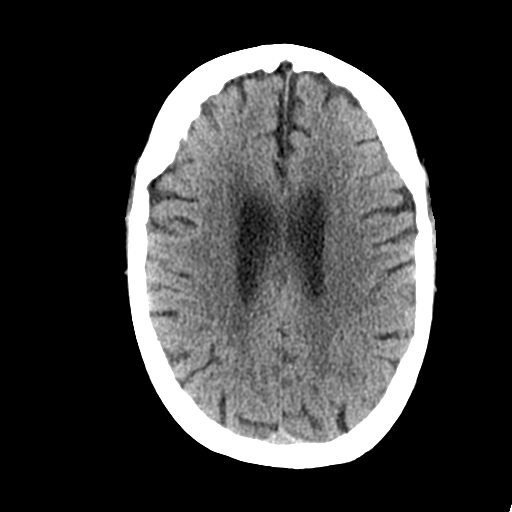
[im 24/33  brain]
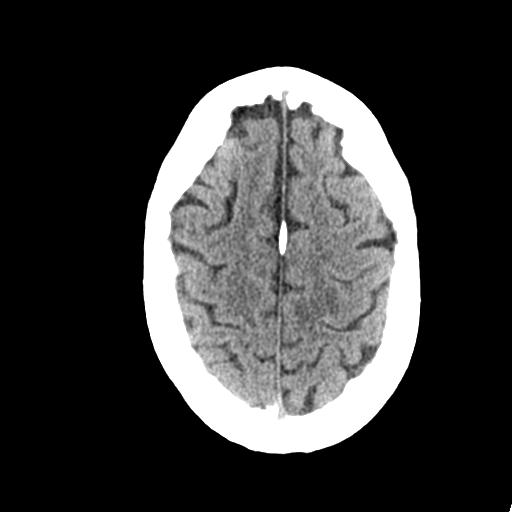
[im 27/33  brain]
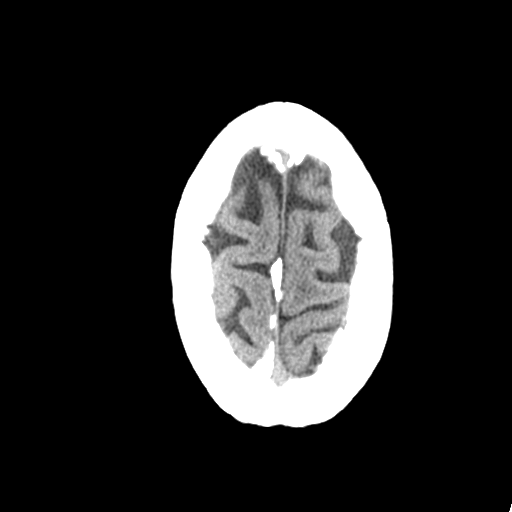
[im 30/33  brain]
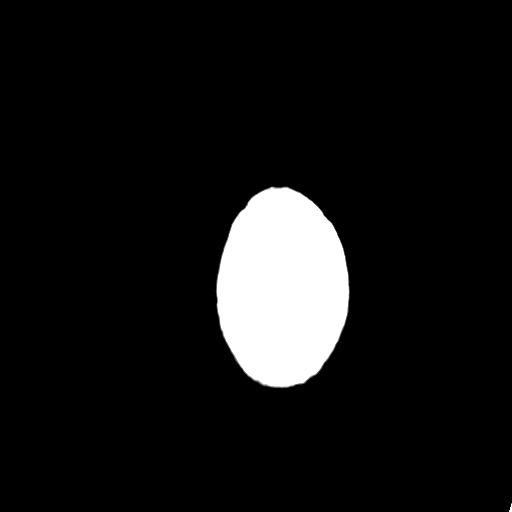
[im 30/33  bone]
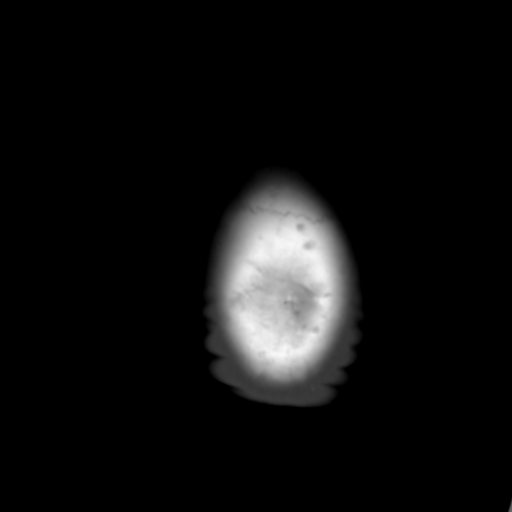

[Series 4: coronal soft tissue · coronal · 0.35mm/px · 3 of 83 slices shown]
[im 32/83  brain]
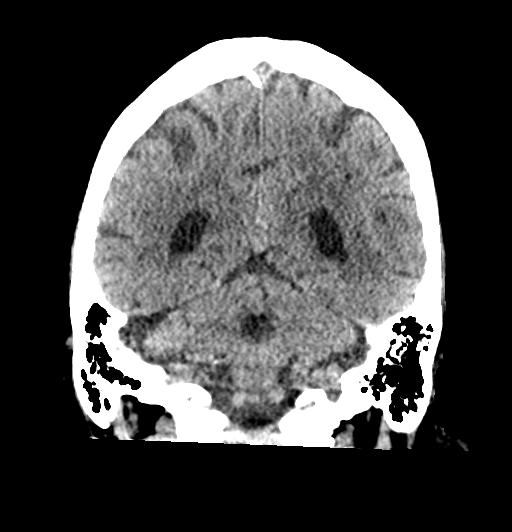
[im 39/83  brain]
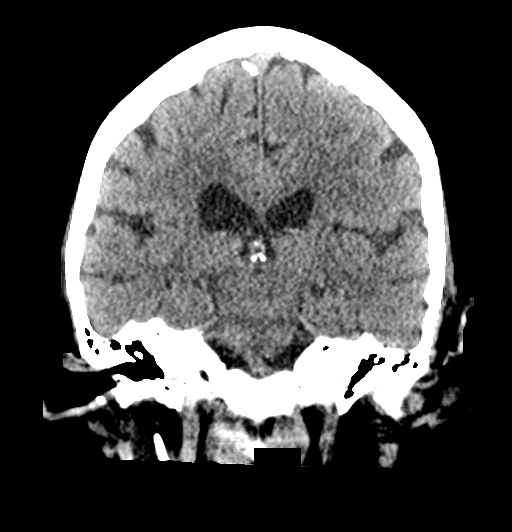
[im 45/83  brain]
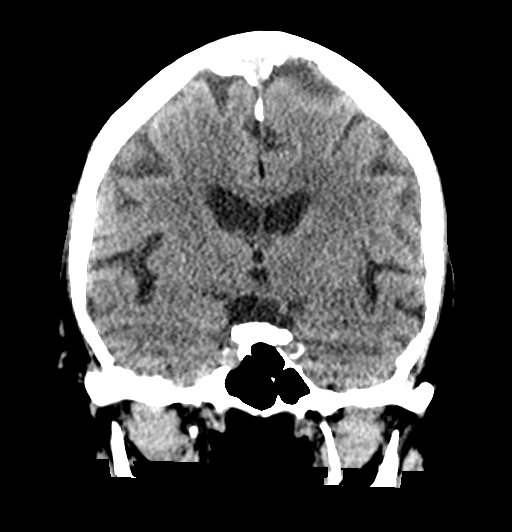

[Series 5: sagittal soft tissue · sagittal · 0.37mm/px · 3 of 59 slices shown]
[im 20/59  brain]
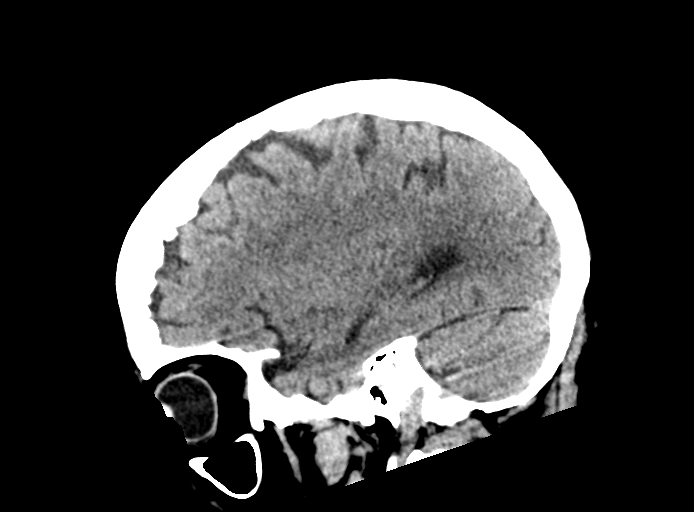
[im 30/59  brain]
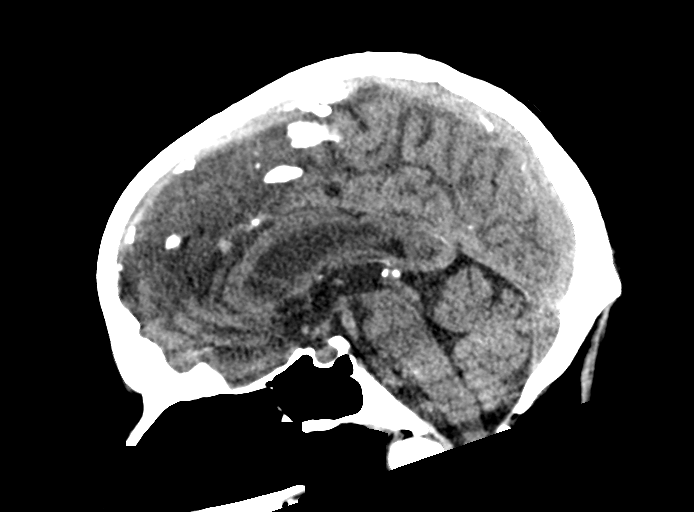
[im 39/59  brain]
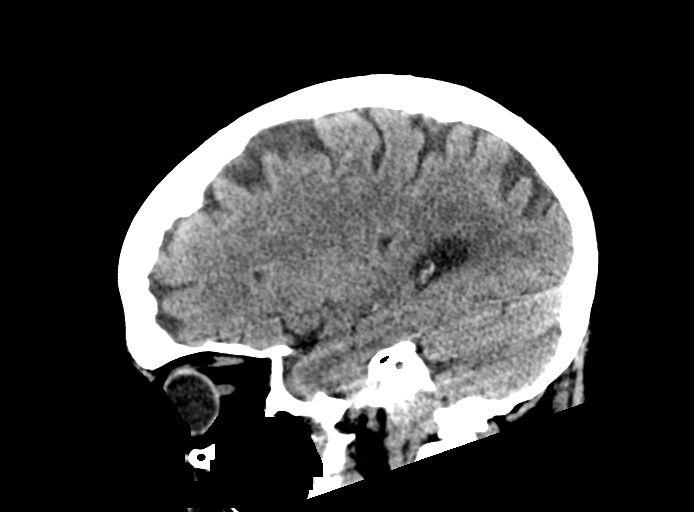

[15 of 47 positions shown; findings below may reference images not displayed]

FINDINGS: Brain: Small 5 mm parafalcine lipoma, [DATE]. Multiple
benign-appearing dural calcifications. No evidence of acute
infarction, hemorrhage, hydrocephalus, extra-axial collection or
mass lesion/mass effect. Symmetric prominence of the ventricles,
cisterns and sulci compatible with parenchymal volume loss. Patchy
areas of white matter hypoattenuation are most compatible with
chronic microvascular angiopathy.

Vascular: Atherosclerotic calcification of the carotid siphons and
intradural vertebral arteries. No hyperdense vessel.

Skull: No calvarial fracture or suspicious osseous lesion. No scalp
swelling or hematoma.

Sinuses/Orbits: Paranasal sinuses and mastoid air cells are
predominantly clear. Included orbital structures are unremarkable.

Other: Edentulous with mandibular prognathism best appreciated on
scout view
IMPRESSION: No acute intracranial abnormality.

## 2020-09-16 IMAGING — CR DG ABDOMEN 2V
1 series · 4 of 4 positions shown · non-contrast
Comparison: No priors.

CLINICAL DATA: 84-year-old male with history of sepsis from
colitis, now presenting with nausea.

EXAM:
ABDOMEN - 2 VIEW

[Series 1: dg abd 2 views · 0.14mm/px · 4 of 4 slices shown]
[im 1/4]
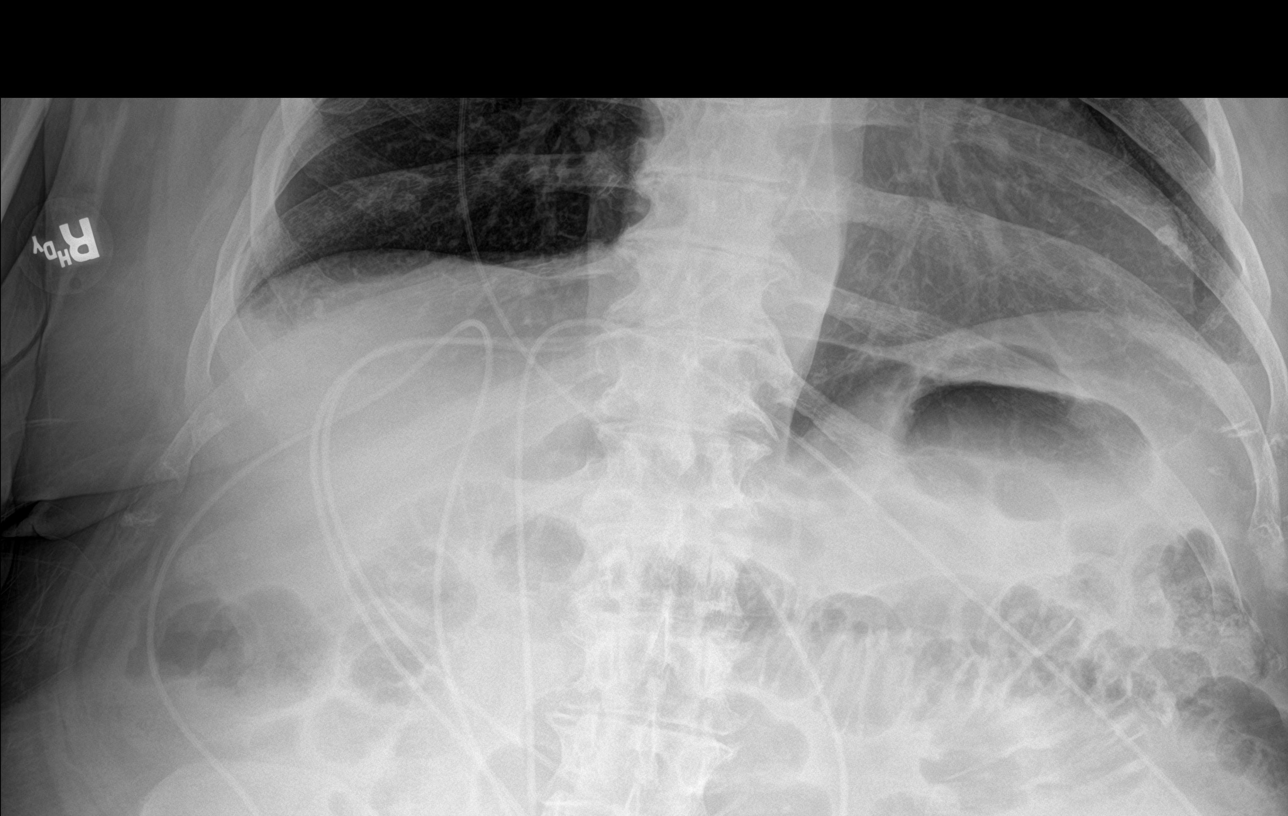
[im 2/4]
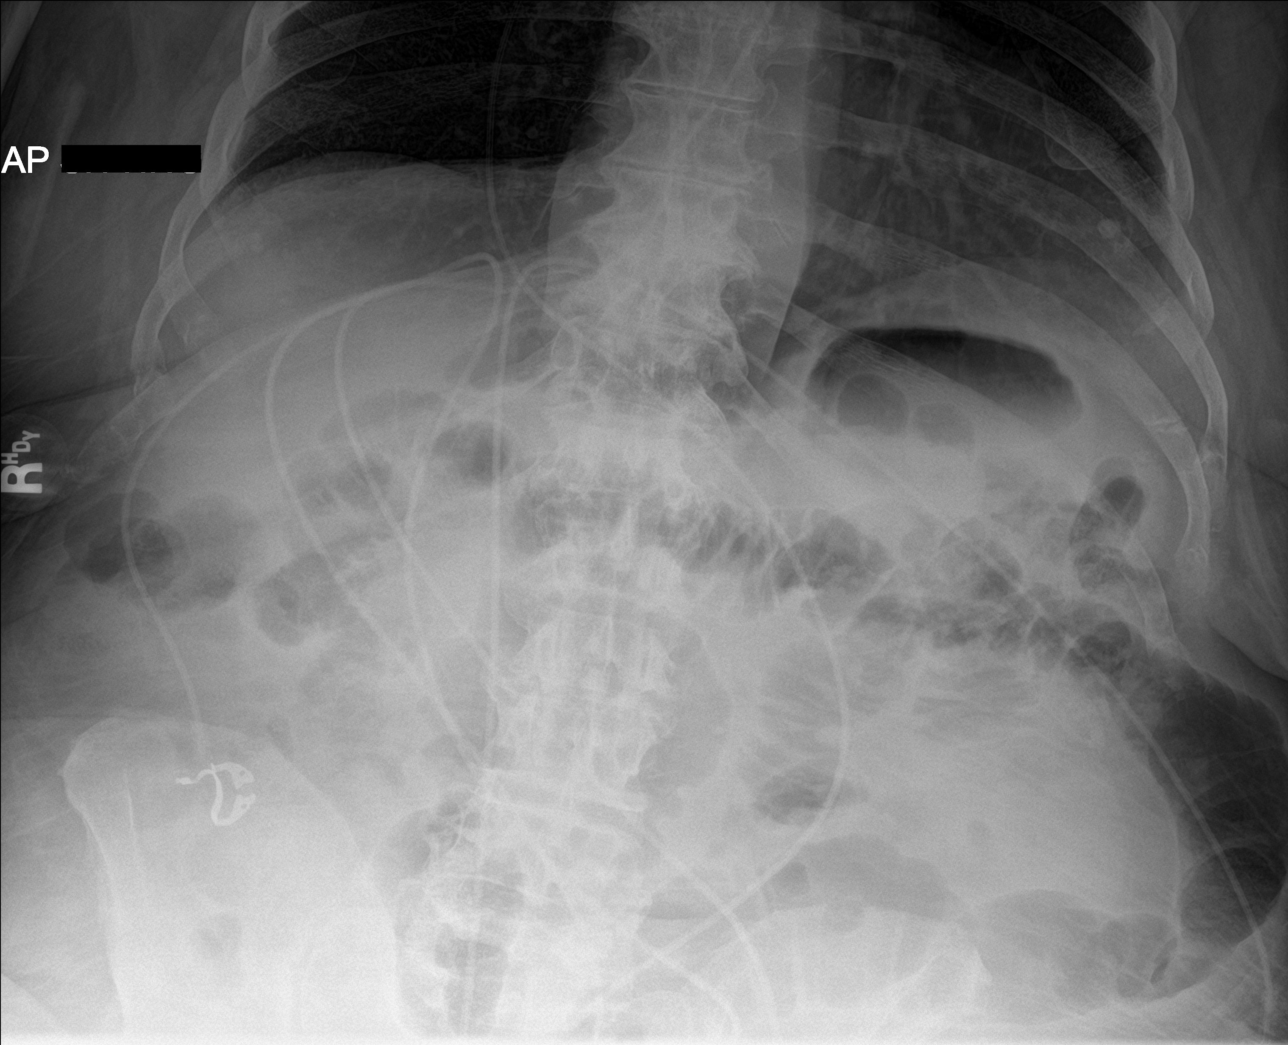
[im 3/4]
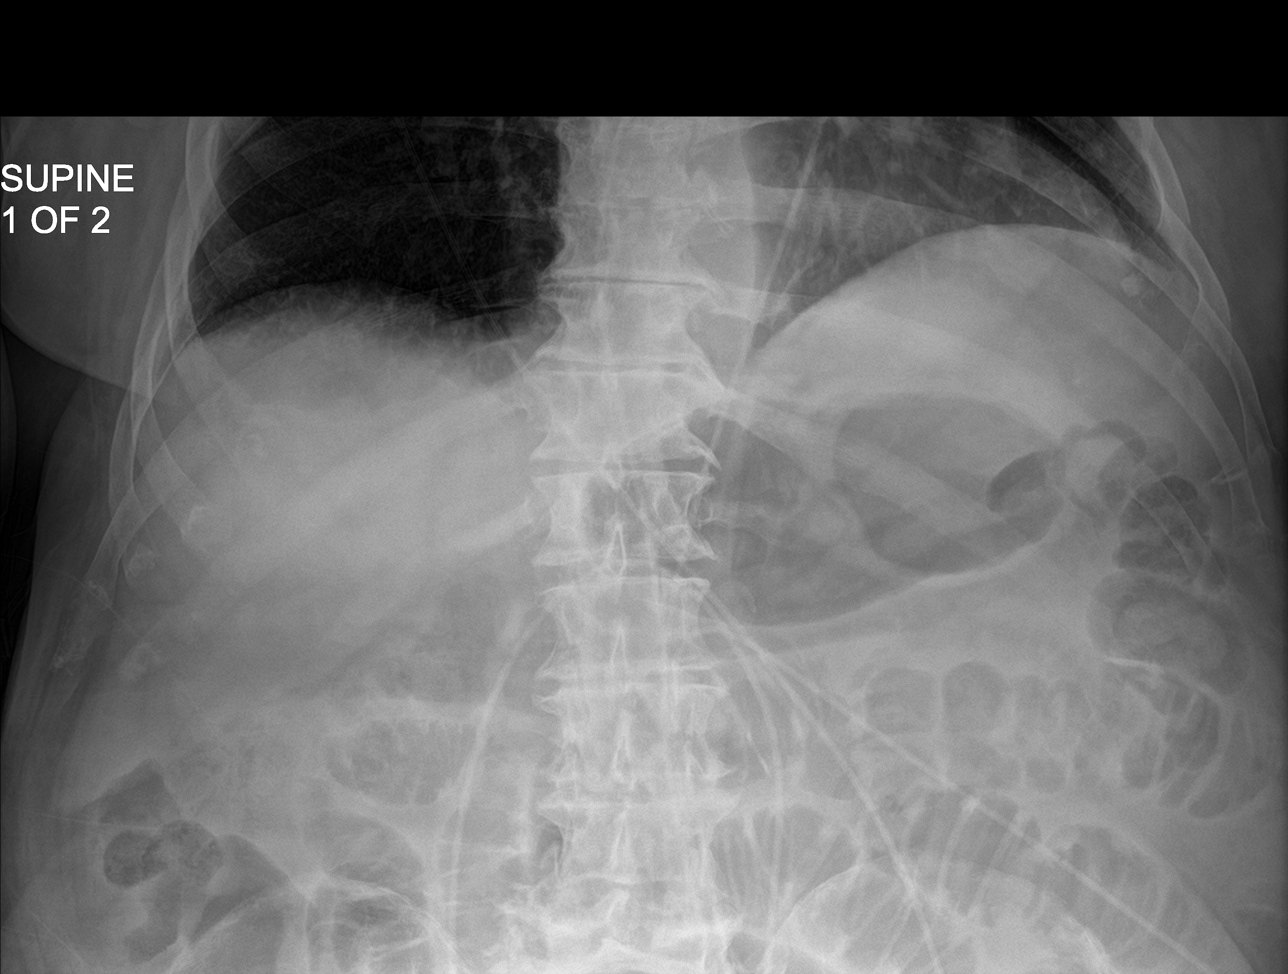
[im 4/4]
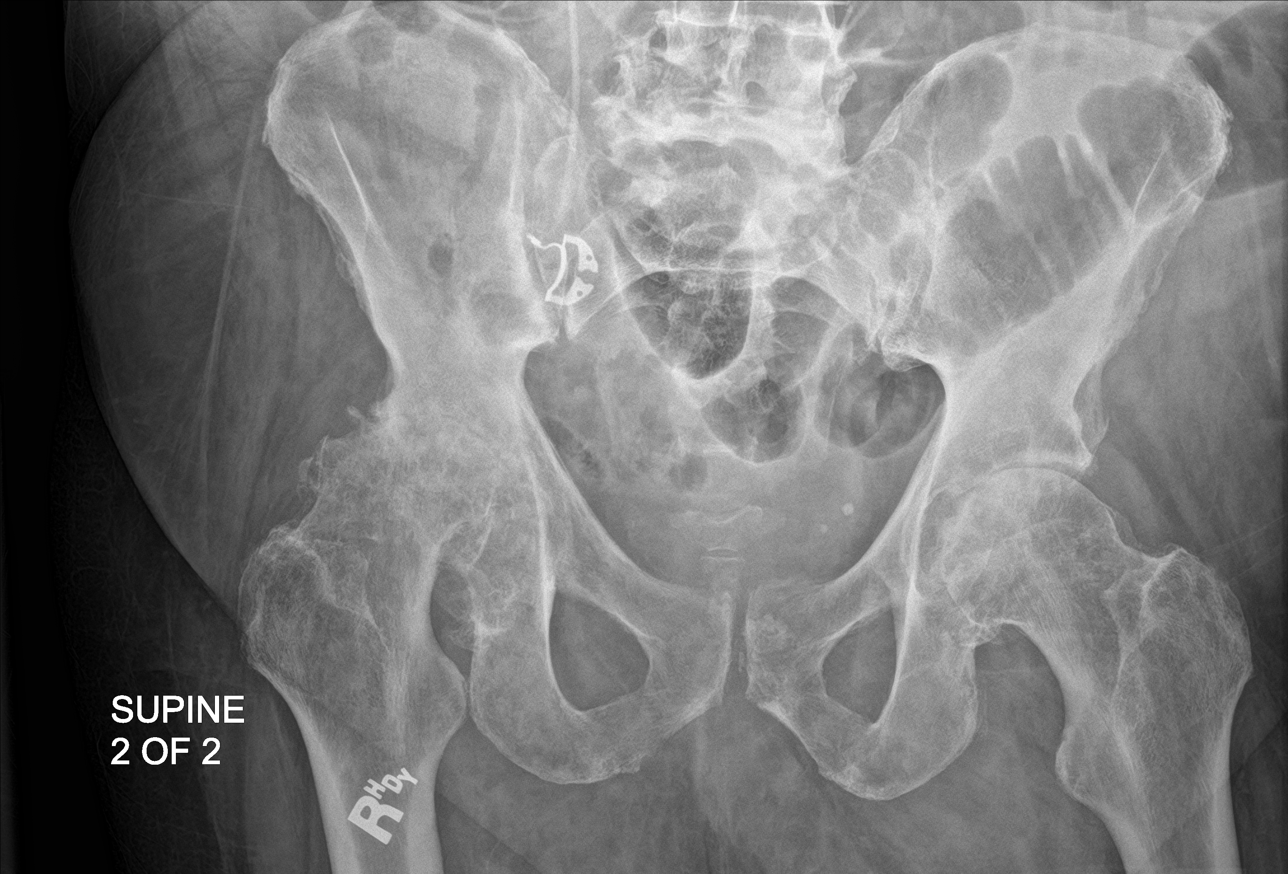

[4 of 4 positions shown; findings below may reference images not displayed]

FINDINGS: Gas in stool is noted throughout the colon extending to the level of
the distal rectum. No pathologic dilatation of small bowel or colon.
No pneumoperitoneum. Advanced degenerative changes in the right hip
joint, potentially from longstanding a vascular necrosis with
associated severe osteoarthritis. Moderate to severe left hip joint
osteoarthritis also noted.
IMPRESSION: 1. Nonobstructive bowel gas pattern.
2. No pneumoperitoneum.
3. Degenerative changes in the hip joints bilaterally (right greater
than left), as above.

## 2020-12-02 DIAGNOSIS — I10 Essential (primary) hypertension: Secondary | ICD-10-CM | POA: Diagnosis not present

## 2020-12-02 DIAGNOSIS — M159 Polyosteoarthritis, unspecified: Secondary | ICD-10-CM | POA: Diagnosis not present

## 2020-12-02 DIAGNOSIS — N1831 Chronic kidney disease, stage 3a: Secondary | ICD-10-CM | POA: Diagnosis not present

## 2020-12-02 DIAGNOSIS — I2510 Atherosclerotic heart disease of native coronary artery without angina pectoris: Secondary | ICD-10-CM | POA: Diagnosis not present

## 2020-12-02 DIAGNOSIS — I5022 Chronic systolic (congestive) heart failure: Secondary | ICD-10-CM | POA: Diagnosis not present

## 2020-12-13 DIAGNOSIS — Z23 Encounter for immunization: Secondary | ICD-10-CM | POA: Diagnosis not present

## 2020-12-24 DIAGNOSIS — Z20822 Contact with and (suspected) exposure to covid-19: Secondary | ICD-10-CM | POA: Diagnosis not present

## 2020-12-25 DIAGNOSIS — Z20822 Contact with and (suspected) exposure to covid-19: Secondary | ICD-10-CM | POA: Diagnosis not present

## 2020-12-29 DIAGNOSIS — I10 Essential (primary) hypertension: Secondary | ICD-10-CM | POA: Diagnosis not present

## 2020-12-29 DIAGNOSIS — N1831 Chronic kidney disease, stage 3a: Secondary | ICD-10-CM | POA: Diagnosis not present

## 2020-12-29 DIAGNOSIS — M159 Polyosteoarthritis, unspecified: Secondary | ICD-10-CM | POA: Diagnosis not present

## 2020-12-29 DIAGNOSIS — I5022 Chronic systolic (congestive) heart failure: Secondary | ICD-10-CM | POA: Diagnosis not present

## 2020-12-29 DIAGNOSIS — I2510 Atherosclerotic heart disease of native coronary artery without angina pectoris: Secondary | ICD-10-CM | POA: Diagnosis not present

## 2021-03-01 DIAGNOSIS — R739 Hyperglycemia, unspecified: Secondary | ICD-10-CM | POA: Diagnosis not present

## 2021-03-01 DIAGNOSIS — I10 Essential (primary) hypertension: Secondary | ICD-10-CM | POA: Diagnosis not present

## 2021-03-01 DIAGNOSIS — Z79899 Other long term (current) drug therapy: Secondary | ICD-10-CM | POA: Diagnosis not present

## 2021-03-08 DIAGNOSIS — Z23 Encounter for immunization: Secondary | ICD-10-CM | POA: Diagnosis not present

## 2021-03-08 DIAGNOSIS — Z0000 Encounter for general adult medical examination without abnormal findings: Secondary | ICD-10-CM | POA: Diagnosis not present

## 2021-03-08 DIAGNOSIS — Z1331 Encounter for screening for depression: Secondary | ICD-10-CM | POA: Diagnosis not present

## 2021-08-30 DIAGNOSIS — I10 Essential (primary) hypertension: Secondary | ICD-10-CM | POA: Diagnosis not present

## 2021-08-30 DIAGNOSIS — R739 Hyperglycemia, unspecified: Secondary | ICD-10-CM | POA: Diagnosis not present

## 2021-08-30 DIAGNOSIS — N1831 Chronic kidney disease, stage 3a: Secondary | ICD-10-CM | POA: Diagnosis not present

## 2021-09-20 DIAGNOSIS — I10 Essential (primary) hypertension: Secondary | ICD-10-CM | POA: Diagnosis not present

## 2021-09-20 DIAGNOSIS — N1831 Chronic kidney disease, stage 3a: Secondary | ICD-10-CM | POA: Diagnosis not present

## 2021-09-20 DIAGNOSIS — L89612 Pressure ulcer of right heel, stage 2: Secondary | ICD-10-CM | POA: Diagnosis not present

## 2021-09-20 DIAGNOSIS — I2510 Atherosclerotic heart disease of native coronary artery without angina pectoris: Secondary | ICD-10-CM | POA: Diagnosis not present

## 2021-09-20 DIAGNOSIS — B351 Tinea unguium: Secondary | ICD-10-CM | POA: Diagnosis not present

## 2021-09-20 DIAGNOSIS — I5022 Chronic systolic (congestive) heart failure: Secondary | ICD-10-CM | POA: Diagnosis not present

## 2021-09-20 DIAGNOSIS — R739 Hyperglycemia, unspecified: Secondary | ICD-10-CM | POA: Diagnosis not present

## 2021-10-06 DIAGNOSIS — M79674 Pain in right toe(s): Secondary | ICD-10-CM | POA: Diagnosis not present

## 2021-10-06 DIAGNOSIS — B351 Tinea unguium: Secondary | ICD-10-CM | POA: Diagnosis not present

## 2021-10-06 DIAGNOSIS — L97421 Non-pressure chronic ulcer of left heel and midfoot limited to breakdown of skin: Secondary | ICD-10-CM | POA: Diagnosis not present

## 2021-10-06 DIAGNOSIS — L600 Ingrowing nail: Secondary | ICD-10-CM | POA: Diagnosis not present

## 2021-10-06 DIAGNOSIS — M79675 Pain in left toe(s): Secondary | ICD-10-CM | POA: Diagnosis not present

## 2022-03-07 DIAGNOSIS — N1831 Chronic kidney disease, stage 3a: Secondary | ICD-10-CM | POA: Diagnosis not present

## 2022-03-07 DIAGNOSIS — I10 Essential (primary) hypertension: Secondary | ICD-10-CM | POA: Diagnosis not present

## 2022-03-07 DIAGNOSIS — R739 Hyperglycemia, unspecified: Secondary | ICD-10-CM | POA: Diagnosis not present

## 2022-03-14 DIAGNOSIS — Z23 Encounter for immunization: Secondary | ICD-10-CM | POA: Diagnosis not present

## 2022-03-14 DIAGNOSIS — Z0000 Encounter for general adult medical examination without abnormal findings: Secondary | ICD-10-CM | POA: Diagnosis not present

## 2022-03-14 DIAGNOSIS — R739 Hyperglycemia, unspecified: Secondary | ICD-10-CM | POA: Diagnosis not present

## 2022-03-14 DIAGNOSIS — I2510 Atherosclerotic heart disease of native coronary artery without angina pectoris: Secondary | ICD-10-CM | POA: Diagnosis not present

## 2022-03-14 DIAGNOSIS — I5022 Chronic systolic (congestive) heart failure: Secondary | ICD-10-CM | POA: Diagnosis not present

## 2022-03-14 DIAGNOSIS — R7303 Prediabetes: Secondary | ICD-10-CM | POA: Diagnosis not present

## 2022-03-14 DIAGNOSIS — N1832 Chronic kidney disease, stage 3b: Secondary | ICD-10-CM | POA: Diagnosis not present

## 2022-05-25 DIAGNOSIS — L84 Corns and callosities: Secondary | ICD-10-CM | POA: Diagnosis not present

## 2022-05-25 DIAGNOSIS — M79672 Pain in left foot: Secondary | ICD-10-CM | POA: Diagnosis not present

## 2022-05-25 DIAGNOSIS — M79671 Pain in right foot: Secondary | ICD-10-CM | POA: Diagnosis not present

## 2022-06-26 ENCOUNTER — Observation Stay: Payer: PPO

## 2022-06-26 ENCOUNTER — Other Ambulatory Visit: Payer: Self-pay

## 2022-06-26 ENCOUNTER — Inpatient Hospital Stay
Admission: EM | Admit: 2022-06-26 | Discharge: 2022-07-03 | DRG: 312 | Disposition: A | Discharge status: Skilled Nursing Facility | Payer: PPO | Attending: Internal Medicine | Admitting: Internal Medicine

## 2022-06-26 ENCOUNTER — Emergency Department: Payer: PPO

## 2022-06-26 DIAGNOSIS — D509 Iron deficiency anemia, unspecified: Secondary | ICD-10-CM | POA: Insufficient documentation

## 2022-06-26 DIAGNOSIS — D631 Anemia in chronic kidney disease: Secondary | ICD-10-CM | POA: Diagnosis not present

## 2022-06-26 DIAGNOSIS — D508 Other iron deficiency anemias: Secondary | ICD-10-CM | POA: Diagnosis not present

## 2022-06-26 DIAGNOSIS — M25572 Pain in left ankle and joints of left foot: Secondary | ICD-10-CM | POA: Diagnosis not present

## 2022-06-26 DIAGNOSIS — N179 Acute kidney failure, unspecified: Secondary | ICD-10-CM | POA: Insufficient documentation

## 2022-06-26 DIAGNOSIS — R42 Dizziness and giddiness: Secondary | ICD-10-CM | POA: Diagnosis not present

## 2022-06-26 DIAGNOSIS — M109 Gout, unspecified: Secondary | ICD-10-CM | POA: Diagnosis not present

## 2022-06-26 DIAGNOSIS — R54 Age-related physical debility: Secondary | ICD-10-CM | POA: Diagnosis not present

## 2022-06-26 DIAGNOSIS — L89151 Pressure ulcer of sacral region, stage 1: Secondary | ICD-10-CM | POA: Diagnosis present

## 2022-06-26 DIAGNOSIS — R0781 Pleurodynia: Secondary | ICD-10-CM | POA: Diagnosis not present

## 2022-06-26 DIAGNOSIS — N400 Enlarged prostate without lower urinary tract symptoms: Secondary | ICD-10-CM | POA: Diagnosis not present

## 2022-06-26 DIAGNOSIS — I5032 Chronic diastolic (congestive) heart failure: Secondary | ICD-10-CM | POA: Diagnosis not present

## 2022-06-26 DIAGNOSIS — R55 Syncope and collapse: Secondary | ICD-10-CM

## 2022-06-26 DIAGNOSIS — Z1152 Encounter for screening for COVID-19: Secondary | ICD-10-CM | POA: Diagnosis not present

## 2022-06-26 DIAGNOSIS — Z6828 Body mass index (BMI) 28.0-28.9, adult: Secondary | ICD-10-CM

## 2022-06-26 DIAGNOSIS — L8990 Pressure ulcer of unspecified site, unspecified stage: Secondary | ICD-10-CM | POA: Insufficient documentation

## 2022-06-26 DIAGNOSIS — Z8249 Family history of ischemic heart disease and other diseases of the circulatory system: Secondary | ICD-10-CM

## 2022-06-26 DIAGNOSIS — E663 Overweight: Secondary | ICD-10-CM | POA: Diagnosis not present

## 2022-06-26 DIAGNOSIS — M1991 Primary osteoarthritis, unspecified site: Secondary | ICD-10-CM | POA: Diagnosis not present

## 2022-06-26 DIAGNOSIS — M47812 Spondylosis without myelopathy or radiculopathy, cervical region: Secondary | ICD-10-CM | POA: Diagnosis not present

## 2022-06-26 DIAGNOSIS — R32 Unspecified urinary incontinence: Secondary | ICD-10-CM | POA: Diagnosis present

## 2022-06-26 DIAGNOSIS — I130 Hypertensive heart and chronic kidney disease with heart failure and stage 1 through stage 4 chronic kidney disease, or unspecified chronic kidney disease: Secondary | ICD-10-CM | POA: Diagnosis not present

## 2022-06-26 DIAGNOSIS — W19XXXA Unspecified fall, initial encounter: Secondary | ICD-10-CM

## 2022-06-26 DIAGNOSIS — N1832 Chronic kidney disease, stage 3b: Secondary | ICD-10-CM | POA: Diagnosis present

## 2022-06-26 DIAGNOSIS — Z96641 Presence of right artificial hip joint: Secondary | ICD-10-CM | POA: Diagnosis not present

## 2022-06-26 DIAGNOSIS — Z7982 Long term (current) use of aspirin: Secondary | ICD-10-CM

## 2022-06-26 DIAGNOSIS — M25712 Osteophyte, left shoulder: Secondary | ICD-10-CM | POA: Diagnosis not present

## 2022-06-26 DIAGNOSIS — L89620 Pressure ulcer of left heel, unstageable: Secondary | ICD-10-CM | POA: Insufficient documentation

## 2022-06-26 DIAGNOSIS — L89622 Pressure ulcer of left heel, stage 2: Secondary | ICD-10-CM | POA: Diagnosis not present

## 2022-06-26 DIAGNOSIS — E860 Dehydration: Secondary | ICD-10-CM | POA: Diagnosis present

## 2022-06-26 DIAGNOSIS — Z96651 Presence of right artificial knee joint: Secondary | ICD-10-CM | POA: Diagnosis not present

## 2022-06-26 DIAGNOSIS — I10 Essential (primary) hypertension: Secondary | ICD-10-CM | POA: Diagnosis present

## 2022-06-26 DIAGNOSIS — Z79899 Other long term (current) drug therapy: Secondary | ICD-10-CM | POA: Diagnosis not present

## 2022-06-26 DIAGNOSIS — I5022 Chronic systolic (congestive) heart failure: Secondary | ICD-10-CM | POA: Diagnosis present

## 2022-06-26 DIAGNOSIS — N390 Urinary tract infection, site not specified: Secondary | ICD-10-CM | POA: Insufficient documentation

## 2022-06-26 DIAGNOSIS — E871 Hypo-osmolality and hyponatremia: Secondary | ICD-10-CM | POA: Diagnosis not present

## 2022-06-26 DIAGNOSIS — D638 Anemia in other chronic diseases classified elsewhere: Secondary | ICD-10-CM | POA: Diagnosis not present

## 2022-06-26 DIAGNOSIS — I2510 Atherosclerotic heart disease of native coronary artery without angina pectoris: Secondary | ICD-10-CM | POA: Diagnosis present

## 2022-06-26 DIAGNOSIS — E538 Deficiency of other specified B group vitamins: Secondary | ICD-10-CM | POA: Diagnosis not present

## 2022-06-26 DIAGNOSIS — I959 Hypotension, unspecified: Secondary | ICD-10-CM | POA: Diagnosis not present

## 2022-06-26 DIAGNOSIS — R0902 Hypoxemia: Secondary | ICD-10-CM | POA: Diagnosis not present

## 2022-06-26 DIAGNOSIS — I951 Orthostatic hypotension: Secondary | ICD-10-CM | POA: Diagnosis not present

## 2022-06-26 LAB — CBC WITH DIFFERENTIAL/PLATELET
Abs Immature Granulocytes: 0.02 10*3/uL (ref 0.00–0.07)
Basophils Absolute: 0 10*3/uL (ref 0.0–0.1)
Basophils Relative: 0 %
Eosinophils Absolute: 0 10*3/uL (ref 0.0–0.5)
Eosinophils Relative: 0 %
HCT: 30.7 % — ABNORMAL LOW (ref 39.0–52.0)
Hemoglobin: 9.5 g/dL — ABNORMAL LOW (ref 13.0–17.0)
Immature Granulocytes: 0 %
Lymphocytes Relative: 13 %
Lymphs Abs: 1.2 10*3/uL (ref 0.7–4.0)
MCH: 31.5 pg (ref 26.0–34.0)
MCHC: 30.9 g/dL (ref 30.0–36.0)
MCV: 101.7 fL — ABNORMAL HIGH (ref 80.0–100.0)
Monocytes Absolute: 0.8 10*3/uL (ref 0.1–1.0)
Monocytes Relative: 9 %
Neutro Abs: 7.2 10*3/uL (ref 1.7–7.7)
Neutrophils Relative %: 78 %
Platelets: 223 10*3/uL (ref 150–400)
RBC: 3.02 MIL/uL — ABNORMAL LOW (ref 4.22–5.81)
RDW: 17.7 % — ABNORMAL HIGH (ref 11.5–15.5)
WBC: 9.3 10*3/uL (ref 4.0–10.5)
nRBC: 0 % (ref 0.0–0.2)

## 2022-06-26 LAB — BASIC METABOLIC PANEL
Anion gap: 8 (ref 5–15)
BUN: 49 mg/dL — ABNORMAL HIGH (ref 8–23)
CO2: 25 mmol/L (ref 22–32)
Calcium: 9 mg/dL (ref 8.9–10.3)
Chloride: 102 mmol/L (ref 98–111)
Creatinine, Ser: 1.85 mg/dL — ABNORMAL HIGH (ref 0.61–1.24)
GFR, Estimated: 35 mL/min — ABNORMAL LOW (ref >60)
Glucose, Bld: 92 mg/dL (ref 70–99)
Potassium: 4.6 mmol/L (ref 3.5–5.1)
Sodium: 135 mmol/L (ref 135–145)

## 2022-06-26 LAB — URINALYSIS, ROUTINE W REFLEX MICROSCOPIC
Bilirubin Urine: NEGATIVE
Glucose, UA: NEGATIVE mg/dL
Hgb urine dipstick: NEGATIVE
Ketones, ur: NEGATIVE mg/dL
Nitrite: NEGATIVE
Protein, ur: NEGATIVE mg/dL
Specific Gravity, Urine: 1.015 (ref 1.005–1.030)
pH: 5 (ref 5.0–8.0)

## 2022-06-26 LAB — PHOSPHORUS: Phosphorus: 3.9 mg/dL (ref 2.5–4.6)

## 2022-06-26 LAB — TROPONIN I (HIGH SENSITIVITY)
Troponin I (High Sensitivity): 67 ng/L — ABNORMAL HIGH (ref <18)
Troponin I (High Sensitivity): 65 ng/L — ABNORMAL HIGH (ref <18)

## 2022-06-26 LAB — MAGNESIUM: Magnesium: 2.3 mg/dL (ref 1.7–2.4)

## 2022-06-26 LAB — PROCALCITONIN: Procalcitonin: <0.1 ng/mL

## 2022-06-26 MED ORDER — SODIUM CHLORIDE 0.9% FLUSH
3.0000 mL | Freq: Two times a day (BID) | INTRAVENOUS | Status: DC
  Administered 2022-06-27 – 2022-07-02 (×12): 3 mL via INTRAVENOUS

## 2022-06-26 MED ORDER — SODIUM CHLORIDE 0.9 % IV SOLN
1.0000 g | Freq: Once | INTRAVENOUS | Status: AC
  Administered 2022-06-26: 1 g via INTRAVENOUS
  Filled 2022-06-26: qty 10

## 2022-06-26 MED ORDER — ACETAMINOPHEN 650 MG RE SUPP: 650.0000 mg | Freq: Four times a day (QID) | RECTAL | Status: AC | PRN

## 2022-06-26 MED ORDER — SODIUM CHLORIDE 0.9 % IV BOLUS
500.0000 mL | Freq: Once | INTRAVENOUS | Status: AC
  Administered 2022-06-27: 500 mL via INTRAVENOUS

## 2022-06-26 MED ORDER — SODIUM CHLORIDE 0.9 % IV SOLN: Freq: Once | INTRAVENOUS | Status: AC

## 2022-06-26 MED ORDER — HEPARIN SODIUM (PORCINE) 5000 UNIT/ML IJ SOLN
5000.0000 [IU] | Freq: Three times a day (TID) | INTRAMUSCULAR | Status: DC
  Administered 2022-06-27 – 2022-07-03 (×19): 5000 [IU] via SUBCUTANEOUS
  Filled 2022-06-26 (×19): qty 1

## 2022-06-26 MED ORDER — HYDRALAZINE HCL 20 MG/ML IJ SOLN: 5.0000 mg | Freq: Three times a day (TID) | INTRAMUSCULAR | Status: AC | PRN

## 2022-06-26 MED ORDER — ONDANSETRON HCL 4 MG/2ML IJ SOLN: 4.0000 mg | Freq: Four times a day (QID) | INTRAMUSCULAR | Status: AC | PRN

## 2022-06-26 MED ORDER — ACETAMINOPHEN 325 MG PO TABS
650.0000 mg | ORAL_TABLET | Freq: Four times a day (QID) | ORAL | Status: AC | PRN
  Administered 2022-06-27 – 2022-06-30 (×2): 650 mg via ORAL
  Filled 2022-06-26 (×3): qty 2

## 2022-06-26 MED ORDER — SODIUM CHLORIDE 0.9 % IV SOLN
1.0000 g | INTRAVENOUS | Status: DC
  Filled 2022-06-26: qty 10

## 2022-06-26 MED ORDER — SENNOSIDES-DOCUSATE SODIUM 8.6-50 MG PO TABS: 1.0000 | ORAL_TABLET | Freq: Every evening | ORAL | Status: DC | PRN

## 2022-06-26 MED ORDER — ONDANSETRON HCL 4 MG PO TABS: 4.0000 mg | ORAL_TABLET | Freq: Four times a day (QID) | ORAL | Status: AC | PRN

## 2022-06-26 NOTE — Assessment & Plan Note (Signed)
-   Present on admission - Check blood cultures x 2 given that patient had syncopal events - Check lactic acid, urine culture - Continue ceftriaxone 1 g IV daily

## 2022-06-26 NOTE — ED Triage Notes (Signed)
Pt comes with c/o fall today. Pt is from home and EMS brought pt. Pt stood up lost balance and fell. No loc or hitting head. Pt does have skin tear to inside of thumb and elbow.  VSS

## 2022-06-26 NOTE — Assessment & Plan Note (Signed)
On baseline serum creatinine is 1.5/eGFR 44 -Strict I's and O's - Etiology workup in progress, I suspect this is secondary to UTI

## 2022-06-26 NOTE — ED Provider Notes (Signed)
   [facility] Provider Note    Event Date/Time   First MD Initiated Contact with Patient 06/26/22 1310     (approximate)   History   Fall   HPI  Jerome [name]. is a 88 y.o. male  who presents to the emergency department today because of concern for near syncopal episodes. The patient states that he has had multiple of these episodes today.  Normally he is able to get out of bed and use his walker for small amounts of mobility.  However when he tried to get up he felt lightheaded.  He then tried to get up later and again felt lightheaded and fell to the ground.  Did suffer some skin tears to his left arm.  Does not think he hit his head.  He states that he had a similar episode of lightheadedness a couple of weeks ago.  He denies any chest pain or shortness of breath with this.     Physical Exam   Triage Vital Signs: ED Triage Vitals  Enc Vitals Group     BP 06/26/22 1055 (!) 97/52     Pulse Rate 06/26/22 1055 (!) 55     Resp 06/26/22 1128 19     Temp 06/26/22 1055 98.6 F (37 C)     Temp Source 06/26/22 1055 Oral     SpO2 06/26/22 1055 100 %     Weight --      Height --      Head Circumference --      Peak Flow --      Pain Score 06/26/22 1044 3     Pain Loc --      Pain Edu? --      Excl. in [place]? --     Most recent vital signs: Vitals:   06/26/22 1055 06/26/22 1128  BP: (!) 97/52   Pulse: (!) 55   Resp:  19  Temp: 98.6 F (37 C)   SpO2: 100%    General: Awake, alert, oriented. CV:  Good peripheral perfusion. Regular rate and rhythm. Resp:  Normal effort. Lungs clear. Abd:  No distention.     ED Results / Procedures / Treatments   Labs (all labs ordered are listed, but only abnormal results are displayed) Labs pending at time of sign out.    PROCEDURES:  Critical Care performed: No  Procedures   MEDICATIONS ORDERED IN ED: Medications - No data to display   IMPRESSION / MDM / [place] / ED COURSE   I reviewed the triage vital signs and the nursing notes.                              Differential diagnosis includes, but is not limited to, anemia, dehydration, acs, arrhythmia.   Patient's presentation is most consistent with acute presentation with potential threat to life or bodily function.  Patient presented to the emergency department today because of concern for near syncopal episodes. One did result in a fall, which caused a couple of skin tears to the left arm. No deformity. Will check EKG, blood work, will get ct head/cervical spine given fall.  FINAL CLINICAL IMPRESSION(S) / ED DIAGNOSES   Final diagnoses:  Fall, initial encounter  Near syncope     Note:  This document was prepared using Systems analyst and may include unintentional dictation errors.    Nance Pear, MD 06/26/22 [phone]

## 2022-06-26 NOTE — H&P (Signed)
History and Physical   Jerome [name]. YWV:[id number] DOB: Mar 31, 1935 DOA: 06/26/2022  PCP: Derinda Late, MD (Confirm with patient/family/NH records and if not entered, this has to be entered at [facility] point of entry) Outpatient Specialists: *** [facility] speciality and name if known) Patient coming from: ***  I have personally briefly reviewed patient's old medical records in [place].  Chief Concern: ***  HPI: [name] is a 88 year old male with history of hypertension, BPH, who presents to the emergency department for chief concerns of syncope.  Initial vitals in the emergency department showed temperature of 98.6, respiration rate of 19, heart rate 65, blood pressure initially 97/52, improved to 137/55, SpO2 100% on room air.  Serum sodium is 135, potassium 4.6, chloride 1062, bicarb 25, BUN of 49, serum creatinine 1.85, EGFR 35, nonfasting blood glucose of 92, WBC 9.3, hemoglobin 9.5, platelets of 223.  High sensitive troponin was 62 and on repeat was 65.  UA was positive for leukocytes.  ED treatment: Ceftriaxone 1 g IV one-time dose.    Social history: ***  Vaccination history: ***  (The initial 2-3 lines should be focused and good to copy and paste in the HPI section of the daily progress note).   (For level 3, the HPI must include 4+ descriptors: Location, Quality, Severity, Duration, Timing, Context, modifying factors, associated signs/symptoms and/or status of 3+ chronic problems.)   ROS:*** Constitutional: no weight change, no fever ENT/Mouth: no sore throat, no rhinorrhea Eyes: no eye pain, no vision changes Cardiovascular: no chest pain, no dyspnea,  no edema, no palpitations Respiratory: no cough, no sputum, no wheezing Gastrointestinal: no nausea, no vomiting, no diarrhea, no constipation Genitourinary: no urinary incontinence, no dysuria, no hematuria Musculoskeletal: no arthralgias, no myalgias Skin: no skin lesions, no  pruritus, Neuro: + weakness, no loss of consciousness, no syncope Psych: no anxiety, no depression, + decrease appetite Heme/Lymph: no bruising, no bleeding  ED Course: ***  Assessment/Plan  Principal Problem:   Syncope Active Problems:   CAD (coronary artery disease)   HTN (hypertension)   AKI (acute kidney injury) (HCC)   UTI (urinary tract infection)    Assessment and Plan: * Syncope - Fall precautions - Orthostatic vital signs on admission - Complete echo not ordered as I suspect his etiology is secondary to UTI - AM team to order echo if indicated - CT head without contrast and cervical spine was read as no acute intracranial findings  and no fractures seen in the cervical spine, respectively  UTI (urinary tract infection) - Present on admission - Check blood cultures x 2 given that patient had syncopal events - Check lactic acid, urine culture - Continue ceftriaxone 1 g IV daily  AKI (acute kidney injury) ([place]) On baseline serum creatinine is 1.5/eGFR 44 -Strict I's and O's - Etiology workup in progress, I suspect this is secondary to UTI  HTN (hypertension) - Hydralazine 5 mg IV every 8 hours as needed for SBP greater than 180, 4 days ordered      *** Chart reviewed.   DVT prophylaxis: ***  Code Status: ***  Diet: *** Family Communication: ***  Disposition Plan: ***  Consults called: ***  Admission status: ***   Past Medical History:  Diagnosis Date   Anxiety    Arthritis    CAD (coronary artery disease)    Chronic kidney disease    stage 3   Chronic systolic CHF (congestive heart failure) ([place])    Depression  Hypertension     Past Surgical History:  Procedure Laterality Date   APPENDECTOMY     HERNIA REPAIR Right    inguinal   JOINT REPLACEMENT Right    knee   TOTAL HIP ARTHROPLASTY Right 01/21/2020   Procedure: TOTAL HIP ARTHROPLASTY;  Surgeon: Dereck Leep, MD;  Location: [facility] ORS;  Service: Orthopedics;  Laterality: Right;     Social History:  reports that he has never smoked. He has never used smokeless tobacco. He reports that he does not drink alcohol and does not use drugs.  Allergies  Allergen Reactions   Gabapentin Other (See Comments)    hypotension   No family history on file. Family history: Family history reviewed and not pertinent***  Prior to Admission medications   Medication Sig Start Date End Date Taking? Authorizing Provider  amLODipine (NORVASC) 5 MG tablet Take 5 mg by mouth daily.    [provider]  Ascorbic Acid (VITAMIN C WITH ROSE HIPS) 500 MG tablet Take 500 mg by mouth in the morning and at bedtime.    [provider]  aspirin EC 81 MG tablet Take 81 mg by mouth daily.    [provider]  carvedilol (COREG) 6.25 MG tablet Take 1 tablet (6.25 mg total) by mouth 2 (two) times daily with a meal. 12/12/18   Fritzi Mandes, MD  celecoxib (CELEBREX) 200 MG capsule Take 1 capsule (200 mg total) by mouth 2 (two) times daily. 01/24/20   Lattie Corns, PA-C  enoxaparin (LOVENOX) 40 MG/0.4ML injection Inject 0.4 mLs (40 mg total) into the skin daily for 14 days. 01/24/20 02/07/20  Lattie Corns, PA-C  losartan-hydrochlorothiazide (HYZAAR) 50-12.5 MG tablet Take 1 tablet by mouth daily.    [provider]  Multiple Vitamin (MULTI-VITAMIN) tablet Take 1 tablet by mouth daily.    [provider]  Multiple Vitamins-Minerals (MULTIVITAMIN WITH MINERALS) tablet Take 1 tablet by mouth daily.    [provider]  oxyCODONE (OXY IR/ROXICODONE) 5 MG immediate release tablet Take 1 tablet (5 mg total) by mouth every 4 (four) hours as needed for moderate pain (pain score 4-6). 01/24/20   Lattie Corns, PA-C  tamsulosin (FLOMAX) 0.4 MG CAPS capsule Take 0.4 mg by mouth daily at 6 PM.     [provider]  traMADol (ULTRAM) 50 MG tablet Take 1 tablet (50 mg total) by mouth every 4 (four) hours as needed for moderate pain. 01/24/20   Lattie Corns, PA-C  vitamin B-12 (CYANOCOBALAMIN) 1000 MCG tablet Take 1,000 mcg by mouth daily.    [provider]    Physical Exam: Vitals:   06/26/22 1055 06/26/22 1128 06/26/22 1857  BP: (!) 97/52  120/61  Pulse: (!) 55  (!) 55  Resp:  19 18  Temp: 98.6 F (37 C)    TempSrc: Oral    SpO2: 100%  100%   Constitutional: appears ***, NAD, calm, comfortable Eyes: PERRL, lids and conjunctivae normal ENMT: Mucous membranes are moist. Posterior pharynx clear of any exudate or lesions. Age-appropriate dentition. Hearing appropriate/loss*** Neck: normal, supple, no masses, no thyromegaly Respiratory: clear to auscultation bilaterally, no wheezing, no crackles. Normal respiratory effort. No accessory muscle use.  Cardiovascular: Regular rate and rhythm, no murmurs / rubs / gallops. No extremity edema. 2+ pedal pulses. No carotid bruits.  Abdomen: no tenderness, no masses palpated, no hepatosplenomegaly. Bowel sounds positive.  Musculoskeletal: no clubbing / cyanosis. No joint deformity upper and lower extremities. Good ROM, no  contractures, no atrophy. Normal muscle tone.  Skin: no rashes, lesions, ulcers. No induration Neurologic: Sensation intact. Strength 5/5 in all 4.  Psychiatric: Normal judgment and insight. Alert and oriented x 3. Normal mood.   EKG: ordered  Chest x-ray on Admission: I personally reviewed and I agree with radiologist reading as below.  DG Chest Port 1 View  Result Date: 06/26/2022 CLINICAL DATA:  Syncope and fall EXAM: PORTABLE CHEST 1 VIEW COMPARISON:  03/20/2019 FINDINGS: Cardiomegaly. Retrocardiac airspace opacities. Additional airspace opacities in the right lower lung medially. No definite pleural effusion. No pneumothorax. Displaced rib fractures. IMPRESSION: Bibasilar airspace opacities suspicious for pneumonia. Electronically Signed   By: Minerva Fester M.D.   On: 06/26/2022 19:37   CT Cervical Spine Wo Contrast  Result Date: 06/26/2022 CLINICAL  DATA:  Trauma, fall EXAM: CT CERVICAL SPINE WITHOUT CONTRAST TECHNIQUE: Multidetector CT imaging of the cervical spine was performed without intravenous contrast. Multiplanar CT image reconstructions were also generated. RADIATION DOSE REDUCTION: This exam was performed according to the departmental dose-optimization program which includes automated exposure control, adjustment of the mA and/or kV according to patient size and/or use of iterative reconstruction technique. COMPARISON:  None Available. FINDINGS: Alignment: Alignment of posterior margins of vertebral bodies is within normal limits. Skull base and vertebrae: No recent fracture is seen. Degenerative changes are noted. Soft tissues and spinal canal: There is extrinsic pressure over the ventral margin of thecal sac caused by posterior bony spurs from C4-C7 levels. There is possible mild spinal stenosis. Disc levels: There is encroachment of neural foramina from C3-C7 levels. Upper chest: There are few blebs and bullae. There is possible ectasia of bronchi in right upper lung field. Other: There are few tiny pockets of air in some of the veins in right supraclavicular region, possibly introduced during venipuncture. IMPRESSION: No recent fracture is seen in cervical spine. Cervical spondylosis with spinal stenosis and encroachment of neural foramina at multiple levels. Electronically Signed   By: Ernie Avena M.D.   On: 06/26/2022 16:27   CT Head Wo Contrast  Result Date: 06/26/2022 CLINICAL DATA:  Trauma, fall, near syncope EXAM: CT HEAD WITHOUT CONTRAST TECHNIQUE: Contiguous axial images were obtained from the base of the skull through the vertex without intravenous contrast. RADIATION DOSE REDUCTION: This exam was performed according to the departmental dose-optimization program which includes automated exposure control, adjustment of the mA and/or kV according to patient size and/or use of iterative reconstruction technique. COMPARISON:   03/20/2019 FINDINGS: Brain: No acute intracranial findings are seen. There are no signs of bleeding within the cranium. Cortical sulci are prominent. There is decreased density in periventricular white matter. Ventricles are not dilated. Vascular: Scattered arterial calcifications are seen. Skull: Motion artifacts limit evaluation of base of the skull. There is no definite fracture in calvarium. Sinuses/Orbits: Motion artifacts limit evaluation. No significant abnormalities are noted. Other: None. IMPRESSION: Motion limited study. No acute intracranial findings are seen. Atrophy. Small-vessel disease. Electronically Signed   By: Ernie Avena M.D.   On: 06/26/2022 16:21    Labs on Admission: I have personally reviewed following labs  CBC: Recent Labs  Lab 06/26/22 1516  WBC 9.3  NEUTROABS 7.2  HGB 9.5*  HCT 30.7*  MCV 101.7*  PLT 223   Basic Metabolic Panel: Recent Labs  Lab 06/26/22 1516  NA 135  K 4.6  CL 102  CO2 25  GLUCOSE 92  BUN 49*  CREATININE 1.85*  CALCIUM 9.0   GFR: CrCl cannot be calculated (  Unknown ideal weight.).  Urine analysis:    Component Value Date/Time   COLORURINE AMBER (A) 06/26/2022 1723   APPEARANCEUR CLOUDY (A) 06/26/2022 1723   APPEARANCEUR Clear 02/20/2012 1420   LABSPEC 1.015 06/26/2022 1723   LABSPEC 1.013 02/20/2012 1420   PHURINE 5.0 06/26/2022 1723   GLUCOSEU NEGATIVE 06/26/2022 1723   GLUCOSEU Negative 02/20/2012 1420   HGBUR NEGATIVE 06/26/2022 1723   BILIRUBINUR NEGATIVE 06/26/2022 1723   BILIRUBINUR Negative 02/20/2012 1420   KETONESUR NEGATIVE 06/26/2022 1723   PROTEINUR NEGATIVE 06/26/2022 1723   NITRITE NEGATIVE 06/26/2022 1723   LEUKOCYTESUR LARGE (A) 06/26/2022 1723   LEUKOCYTESUR Negative 02/20/2012 1420   This document was prepared using Dragon Voice Recognition software and may include unintentional dictation errors.  Dr. Tobie Poet [facility]  If 7PM-7AM, please contact overnight-coverage provider If 7AM-7PM,  please contact day coverage provider [email or web address]  06/26/2022, 7:55 PM

## 2022-06-26 NOTE — Assessment & Plan Note (Addendum)
-   Fall precautions - Orthostatic vital signs on admission - Complete echo not ordered as I suspect his etiology is secondary to UTI - AM team to order echo if indicated - CT head without contrast and cervical spine was read as no acute intracranial findings  and no fractures seen in the cervical spine, respectively - Check procalcitonin

## 2022-06-26 NOTE — Assessment & Plan Note (Signed)
-   Hydralazine 5 mg IV every 8 hours as needed for SBP greater than 180, 4 days ordered 

## 2022-06-26 NOTE — Hospital Course (Addendum)
[name] is a 88 year old male with history of hypertension, BPH, who presents to the emergency department for chief concerns of syncope. Patient appears to be orthostatic and dizzy when standing.  Received IV fluids.  Patient also has slightly worsening renal function, was placed on IV fluids. Patient condition has been improved, currently pending nursing placement

## 2022-06-26 NOTE — Assessment & Plan Note (Deleted)
-   Serum creatinine is 1.5/eGFR 44

## 2022-06-27 ENCOUNTER — Observation Stay: Payer: PPO

## 2022-06-27 ENCOUNTER — Encounter: Payer: Self-pay | Admitting: Internal Medicine

## 2022-06-27 DIAGNOSIS — M25712 Osteophyte, left shoulder: Secondary | ICD-10-CM | POA: Diagnosis not present

## 2022-06-27 DIAGNOSIS — R0781 Pleurodynia: Secondary | ICD-10-CM | POA: Diagnosis not present

## 2022-06-27 DIAGNOSIS — R55 Syncope and collapse: Secondary | ICD-10-CM | POA: Diagnosis not present

## 2022-06-27 LAB — CBC
HCT: 28.5 % — ABNORMAL LOW (ref 39.0–52.0)
Hemoglobin: 8.8 g/dL — ABNORMAL LOW (ref 13.0–17.0)
MCH: 30.8 pg (ref 26.0–34.0)
MCHC: 30.9 g/dL (ref 30.0–36.0)
MCV: 99.7 fL (ref 80.0–100.0)
Platelets: 222 10*3/uL (ref 150–400)
RBC: 2.86 MIL/uL — ABNORMAL LOW (ref 4.22–5.81)
RDW: 17.7 % — ABNORMAL HIGH (ref 11.5–15.5)
WBC: 7.6 10*3/uL (ref 4.0–10.5)
nRBC: 0 % (ref 0.0–0.2)

## 2022-06-27 LAB — BASIC METABOLIC PANEL
Anion gap: 7 (ref 5–15)
BUN: 49 mg/dL — ABNORMAL HIGH (ref 8–23)
CO2: 26 mmol/L (ref 22–32)
Calcium: 8.6 mg/dL — ABNORMAL LOW (ref 8.9–10.3)
Chloride: 104 mmol/L (ref 98–111)
Creatinine, Ser: 1.94 mg/dL — ABNORMAL HIGH (ref 0.61–1.24)
GFR, Estimated: 33 mL/min — ABNORMAL LOW (ref >60)
Glucose, Bld: 104 mg/dL — ABNORMAL HIGH (ref 70–99)
Potassium: 4 mmol/L (ref 3.5–5.1)
Sodium: 137 mmol/L (ref 135–145)

## 2022-06-27 LAB — LACTIC ACID, PLASMA
Lactic Acid, Venous: 1.1 mmol/L (ref 0.5–1.9)
Lactic Acid, Venous: 1.4 mmol/L (ref 0.5–1.9)

## 2022-06-27 LAB — RESP PANEL BY RT-PCR (RSV, FLU A&B, COVID)  RVPGX2
Influenza A by PCR: NEGATIVE
Influenza B by PCR: NEGATIVE
Resp Syncytial Virus by PCR: NEGATIVE
SARS Coronavirus 2 by RT PCR: NEGATIVE

## 2022-06-27 LAB — PROCALCITONIN: Procalcitonin: <0.1 ng/mL

## 2022-06-27 LAB — GLUCOSE, CAPILLARY: Glucose-Capillary: 97 mg/dL (ref 70–99)

## 2022-06-27 MED ORDER — CARVEDILOL 6.25 MG PO TABS: 6.2500 mg | ORAL_TABLET | Freq: Two times a day (BID) | ORAL | Status: DC

## 2022-06-27 MED ORDER — ADULT MULTIVITAMIN W/MINERALS CH
1.0000 | ORAL_TABLET | Freq: Every day | ORAL | Status: DC
  Administered 2022-06-27 – 2022-07-03 (×7): 1 via ORAL
  Filled 2022-06-27 (×7): qty 1

## 2022-06-27 MED ORDER — SODIUM CHLORIDE 0.9 % IV BOLUS
500.0000 mL | Freq: Once | INTRAVENOUS | Status: AC
  Administered 2022-06-27: 500 mL via INTRAVENOUS

## 2022-06-27 MED ORDER — SODIUM CHLORIDE 0.9 % IV SOLN: INTRAVENOUS | Status: AC

## 2022-06-27 MED ORDER — ASPIRIN 81 MG PO TBEC
81.0000 mg | DELAYED_RELEASE_TABLET | Freq: Every day | ORAL | Status: DC
  Administered 2022-06-27 – 2022-07-03 (×7): 81 mg via ORAL
  Filled 2022-06-27 (×7): qty 1

## 2022-06-27 MED ORDER — CARVEDILOL 3.125 MG PO TABS: 3.1250 mg | ORAL_TABLET | Freq: Two times a day (BID) | ORAL | Status: DC

## 2022-06-27 MED ORDER — TAMSULOSIN HCL 0.4 MG PO CAPS
0.4000 mg | ORAL_CAPSULE | Freq: Every day | ORAL | Status: DC
  Administered 2022-06-27 – 2022-07-02 (×6): 0.4 mg via ORAL
  Filled 2022-06-27 (×6): qty 1

## 2022-06-27 MED ORDER — VITAMIN C 500 MG PO TABS
500.0000 mg | ORAL_TABLET | Freq: Two times a day (BID) | ORAL | Status: DC
  Administered 2022-06-27 – 2022-07-03 (×13): 500 mg via ORAL
  Filled 2022-06-27 (×13): qty 1

## 2022-06-27 NOTE — Progress Notes (Signed)
Unable to complete orthostatic VS due to patient experiencing dizziness when attempting to stand. Laying and sitting completed.

## 2022-06-27 NOTE — Consult Note (Signed)
WOC Nurse Consult Note: Reason for Consult:left heel with Unstageable pressure injury, right heel with dry callus Wound type:pressure Pressure Injury POA: Yes Measurement:Bedside RN to measure and document measurements for left heel Unstageable pressure injury with next dressing change today Wound bed:dry stable eschar Drainage (amount, consistency, odor) None Periwound: intact Dressing procedure/placement/frequency:I will provide Nursing with guidance for the care of the bilateral heels using a NS cleanse for the left heel ulcer and following with painting of the stable eschar with a betadine (povidone-iodine) swabstick and allowing it to air-dry. When dry, a dry dressing will be placed and secured with a silicone foam dressing. Both feet are to be placed into pressure redistribution heel boots. A silicone foam is also to be placed prophylactically to the sacrum. Turning and repositioning is in place, but I have added guidance to minimize time in the supine position.  [place] nursing team will not follow, but will remain available to this patient, the nursing and medical teams.  Please re-consult if needed.  Thank you for inviting [place] to participate in this patient's Plan of Care.  Maudie Flakes, MSN, RN, CNS, [place], Serita Grammes, [employer], [employer] phone:  [phone]

## 2022-06-27 NOTE — Assessment & Plan Note (Signed)
-   Aspirin, Coreg

## 2022-06-27 NOTE — Progress Notes (Signed)
  PROGRESS NOTE    Jerome [name].  WUJ:[id number] DOB: June 14, 1935 DOA: 06/26/2022 PCP: Derinda Late, MD  111A/111A-AA  LOS: 0 days   Brief hospital course:   Assessment & Plan: [name] is a 88 year old male with history of hypertension, BPH, who presents to the emergency department for chief concerns of syncope.    * Syncope - likely due to orthostasis, as BP was low on presentation, and pt felt dizzy upon standing.   - CT head without contrast and cervical spine was read as no acute intracranial findings  and no fractures seen in the cervical spine, respectively Plan: --500 ml bolus x2 today, f/b MIVF@75  for 12 hours --repeat orthostatic BP tomorrow --hold home BP meds due to soft BP  UTI, ruled out --denied dysuria.  Has urinary incontinence which is baseline --d/c ceftriaxone  AKI (acute kidney injury) ([place]) --Cr 1.85 on presentation, was 1.5 in Sept 2023.  Likely due to dehydration. Plan: --hold home Hyzaar --500 ml bolus x2 today, f/b MIVF@75  for 12 hours  HTN (hypertension) --hold home coreg, Hyzaar and amlodipine due to soft BP and orthostasis  CAD (coronary artery disease) --cont ASA   DVT prophylaxis: Heparin SQ Code Status: Full code  Family Communication:  Level of care: Telemetry Medical Dispo:   The patient is from: home Anticipated d/c is to: home Anticipated d/c date is: 1-2 days Patient currently is not medically ready to d/c due to: can't stand up due to dizziness   Subjective and Interval History:  RN noted pt Unable to complete orthostatic VS due to patient experiencing dizziness when attempting to stand.  500 ml bolus was ordered last night but not given.  Pt complained of both heel hurting, L>R.  Denied dysuria.   Objective: Vitals:   06/27/22 0841 06/27/22 1157 06/27/22 1739 06/27/22 2042  BP: (!) 109/57 (!) 104/58 121/71 (!) 120/59  Pulse: (!) 53 (!) 56 (!) 57 60  Resp: 18 20 20 16   Temp: (!) 97.5 F (36.4 C) 98  F (36.7 C) 98 F (36.7 C) 98.3 F (36.8 C)  TempSrc:      SpO2: 100% 100% 99% 99%  Weight:      Height:        Intake/Output Summary (Last 24 hours) at 06/27/2022 2105 Last data filed at 06/26/2022 2335 Gross per 24 hour  Intake 1100 ml  Output --  Net 1100 ml   Filed Weights   06/27/22 0300  Weight: 85.1 kg    Examination:   Constitutional: NAD, AAOx3 HEENT: conjunctivae and lids normal, EOMI CV: No cyanosis.   RESP: normal respiratory effort, on RA Extremities: both feed in foam protector boots. SKIN: warm, dry Neuro: II - XII grossly intact.   Psych: Normal mood and affect.  Appropriate judgement and reason   Data Reviewed: I have personally reviewed labs and imaging studies  Time spent: 50 minutes  Enzo Bi, MD [facility] If 7PM-7AM, please contact night-coverage 06/27/2022, 9:05 PM

## 2022-06-28 ENCOUNTER — Inpatient Hospital Stay: Payer: PPO

## 2022-06-28 ENCOUNTER — Inpatient Hospital Stay
Admit: 2022-06-28 | Discharge: 2022-06-28 | Disposition: A | Discharge status: Home / Self Care | Payer: PPO | Attending: Internal Medicine | Admitting: Internal Medicine

## 2022-06-28 DIAGNOSIS — R32 Unspecified urinary incontinence: Secondary | ICD-10-CM | POA: Diagnosis present

## 2022-06-28 DIAGNOSIS — D508 Other iron deficiency anemias: Secondary | ICD-10-CM | POA: Diagnosis not present

## 2022-06-28 DIAGNOSIS — D631 Anemia in chronic kidney disease: Secondary | ICD-10-CM | POA: Diagnosis present

## 2022-06-28 DIAGNOSIS — E871 Hypo-osmolality and hyponatremia: Secondary | ICD-10-CM | POA: Diagnosis present

## 2022-06-28 DIAGNOSIS — D509 Iron deficiency anemia, unspecified: Secondary | ICD-10-CM | POA: Diagnosis present

## 2022-06-28 DIAGNOSIS — I951 Orthostatic hypotension: Secondary | ICD-10-CM | POA: Diagnosis present

## 2022-06-28 DIAGNOSIS — N400 Enlarged prostate without lower urinary tract symptoms: Secondary | ICD-10-CM | POA: Diagnosis present

## 2022-06-28 DIAGNOSIS — R55 Syncope and collapse: Secondary | ICD-10-CM | POA: Diagnosis present

## 2022-06-28 DIAGNOSIS — E663 Overweight: Secondary | ICD-10-CM | POA: Insufficient documentation

## 2022-06-28 DIAGNOSIS — Z1152 Encounter for screening for COVID-19: Secondary | ICD-10-CM | POA: Diagnosis not present

## 2022-06-28 DIAGNOSIS — L89620 Pressure ulcer of left heel, unstageable: Secondary | ICD-10-CM | POA: Diagnosis present

## 2022-06-28 DIAGNOSIS — L8990 Pressure ulcer of unspecified site, unspecified stage: Secondary | ICD-10-CM | POA: Insufficient documentation

## 2022-06-28 DIAGNOSIS — L89151 Pressure ulcer of sacral region, stage 1: Secondary | ICD-10-CM | POA: Diagnosis present

## 2022-06-28 DIAGNOSIS — I5022 Chronic systolic (congestive) heart failure: Secondary | ICD-10-CM | POA: Diagnosis not present

## 2022-06-28 DIAGNOSIS — N1832 Chronic kidney disease, stage 3b: Secondary | ICD-10-CM

## 2022-06-28 DIAGNOSIS — E860 Dehydration: Secondary | ICD-10-CM | POA: Diagnosis present

## 2022-06-28 DIAGNOSIS — Z6828 Body mass index (BMI) 28.0-28.9, adult: Secondary | ICD-10-CM | POA: Diagnosis not present

## 2022-06-28 DIAGNOSIS — I130 Hypertensive heart and chronic kidney disease with heart failure and stage 1 through stage 4 chronic kidney disease, or unspecified chronic kidney disease: Secondary | ICD-10-CM | POA: Diagnosis present

## 2022-06-28 DIAGNOSIS — Z79899 Other long term (current) drug therapy: Secondary | ICD-10-CM | POA: Diagnosis not present

## 2022-06-28 DIAGNOSIS — D638 Anemia in other chronic diseases classified elsewhere: Secondary | ICD-10-CM | POA: Diagnosis not present

## 2022-06-28 DIAGNOSIS — I2510 Atherosclerotic heart disease of native coronary artery without angina pectoris: Secondary | ICD-10-CM | POA: Diagnosis present

## 2022-06-28 DIAGNOSIS — R54 Age-related physical debility: Secondary | ICD-10-CM | POA: Diagnosis present

## 2022-06-28 DIAGNOSIS — Z8249 Family history of ischemic heart disease and other diseases of the circulatory system: Secondary | ICD-10-CM | POA: Diagnosis not present

## 2022-06-28 DIAGNOSIS — I5032 Chronic diastolic (congestive) heart failure: Secondary | ICD-10-CM | POA: Diagnosis present

## 2022-06-28 DIAGNOSIS — Z7982 Long term (current) use of aspirin: Secondary | ICD-10-CM | POA: Diagnosis not present

## 2022-06-28 DIAGNOSIS — Z96641 Presence of right artificial hip joint: Secondary | ICD-10-CM | POA: Diagnosis present

## 2022-06-28 LAB — URINE CULTURE

## 2022-06-28 LAB — CBC
HCT: 28.5 % — ABNORMAL LOW (ref 39.0–52.0)
Hemoglobin: 9.1 g/dL — ABNORMAL LOW (ref 13.0–17.0)
MCH: 31.7 pg (ref 26.0–34.0)
MCHC: 31.9 g/dL (ref 30.0–36.0)
MCV: 99.3 fL (ref 80.0–100.0)
Platelets: 211 10*3/uL (ref 150–400)
RBC: 2.87 MIL/uL — ABNORMAL LOW (ref 4.22–5.81)
RDW: 17.5 % — ABNORMAL HIGH (ref 11.5–15.5)
WBC: 8.4 10*3/uL (ref 4.0–10.5)
nRBC: 0 % (ref 0.0–0.2)

## 2022-06-28 LAB — BASIC METABOLIC PANEL
Anion gap: 5 (ref 5–15)
BUN: 40 mg/dL — ABNORMAL HIGH (ref 8–23)
CO2: 25 mmol/L (ref 22–32)
Calcium: 8.2 mg/dL — ABNORMAL LOW (ref 8.9–10.3)
Chloride: 102 mmol/L (ref 98–111)
Creatinine, Ser: 1.99 mg/dL — ABNORMAL HIGH (ref 0.61–1.24)
GFR, Estimated: 32 mL/min — ABNORMAL LOW (ref >60)
Glucose, Bld: 90 mg/dL (ref 70–99)
Potassium: 4.2 mmol/L (ref 3.5–5.1)
Sodium: 132 mmol/L — ABNORMAL LOW (ref 135–145)

## 2022-06-28 LAB — GLUCOSE, CAPILLARY: Glucose-Capillary: 91 mg/dL (ref 70–99)

## 2022-06-28 LAB — MAGNESIUM: Magnesium: 2.1 mg/dL (ref 1.7–2.4)

## 2022-06-28 MED ORDER — SODIUM CHLORIDE 0.9 % IV SOLN: INTRAVENOUS | Status: AC

## 2022-06-28 NOTE — Progress Notes (Signed)
*  PRELIMINARY RESULTS* Echocardiogram 2D Echocardiogram has been performed.  Sherrie Sport 06/28/2022, 2:18 PM

## 2022-06-28 NOTE — TOC Initial Note (Signed)
Transition of Care (TOC) - Initial/Assessment Note    Patient Details  Name: Jerome [name]. MRN: [id number] Date of Birth: Nov 02, 1934  Transition of Care [facility]) CM/SW Contact:    Gerilyn Pilgrim, LCSW Phone Number: 06/28/2022, 2:16 PM  Clinical Narrative:  SW  spoke with patients son. Son states he has a Therapist, sports his dad pays privately to come out and take care of the wounds on his heels. Son states he is very ill himself and states that he is not fond of home health but will agree to it if needed. Son prefers pt go to Radiation protection practitioner. SW explained that is not the recommendation currently from therapy. Son now agreeable to [facility]. Pt accepted for [facility] with PT/OT/RN with [facility].                 Expected Discharge Plan: [place] Barriers to Discharge: Continued Medical Work up   Patient Goals and CMS Choice     Choice offered to / list presented to : Adult Children      Expected Discharge Plan and Services     Post Acute Care Choice: Home Health                                        Prior Living Arrangements/Services              Need for Family Participation in Patient Care: Yes (Comment) Care giver support system in place?: Yes (comment)   Criminal Activity/Legal Involvement Pertinent to Current Situation/Hospitalization: Yes - Comment as needed  Activities of Daily Living Home Assistive Devices/Equipment: Walker (specify type), Wheelchair ADL Screening (condition at time of admission) Patient's cognitive ability adequate to safely complete daily activities?: Yes Is the patient deaf or have difficulty hearing?: No Does the patient have difficulty seeing, even when wearing glasses/contacts?: No Does the patient have difficulty concentrating, remembering, or making decisions?: No Patient able to express need for assistance with ADLs?: Yes Does the patient have difficulty dressing or bathing?: Yes Independently performs ADLs?:  No Communication: Independent Dressing (OT): Needs assistance Is this a change from baseline?: Pre-admission baseline Grooming: Needs assistance Is this a change from baseline?: Pre-admission baseline Feeding: Independent Bathing: Needs assistance Is this a change from baseline?: Pre-admission baseline Toileting: Independent In/Out Bed: Independent with device (comment) Walks in Home: Independent with device (comment) Does the patient have difficulty walking or climbing stairs?: Yes Weakness of Legs: Both Weakness of Arms/Hands: None  Permission Sought/Granted Permission sought to share information with : Family Supports    Share Information with NAME: son           Emotional Assessment              Admission diagnosis:  Syncope [R55] Near syncope [R55] Fall, initial encounter [W19.XXXA] Patient Active Problem List   Diagnosis Date Noted   Hyponatremia 06/28/2022   Pressure injury of skin 06/28/2022   Overweight 06/28/2022   Syncope due to orthostatic hypotension 06/28/2022   Syncope 06/26/2022   Hx of total hip arthroplasty, right 01/21/2020   Sepsis ([place]) 75/64/3329   Chronic systolic CHF (congestive heart failure) ([place]) 03/20/2019   CAD (coronary artery disease) 03/20/2019   HTN (hypertension) 03/20/2019   Acute on chronic renal failure ([place]) 03/20/2019   Severe sepsis ([place]) 03/20/2019   Right leg pain 12/13/2018   Acute exacerbation of CHF (congestive heart failure) (  [place]) 12/09/2018   Benign essential hypertension 01/12/2014   Gout 01/12/2014   PCP:  Derinda Late, MD Pharmacy:   [id number] - [place], [place] [address] [place] [address] Phone: [phone] Fax: [phone]     Social Determinants of Health (SDOH) Social History: SDOH Screenings   Food Insecurity: No Food Insecurity (06/27/2022)  Housing: Low Risk  (06/27/2022)  Transportation Needs: No Transportation Needs (06/27/2022)  Utilities: Not At Risk (06/27/2022)   Tobacco Use: Low Risk  (06/27/2022)   SDOH Interventions: Food Insecurity Interventions: Intervention Not Indicated Housing Interventions: Intervention Not Indicated Transportation Interventions: Intervention Not Indicated Utilities Interventions: Intervention Not Indicated   Readmission Risk Interventions     No data to display

## 2022-06-28 NOTE — Progress Notes (Addendum)
  Progress Note   Patient: Jerome [name]. MOQ:[id number] DOB: 12-10-1934 DOA: 06/26/2022     0 DOS: the patient was seen and examined on 06/28/2022   Brief hospital course: Mr. Cordera Stineman is a 88 year old male with history of hypertension, BPH, who presents to the emergency department for chief concerns of syncope. Patient appears to be orthostatic and dizzy when standing.  Received IV fluids.  Patient also has slightly worsening renal function, was placed on IV fluids.  Assessment and Plan:  * Syncope UTI, ruled out - likely due to orthostasis, as BP was low on presentation, and pt felt dizzy upon standing.   - CT head without contrast and cervical spine was read as no acute intracranial findings  and no fractures seen in the cervical spine, respectively Did not have any dysuria, no evidence of UTI. Patient still feels dizzy today, renal function still not back to baseline, will give another 500 mL fluid. Obtain echocardiogram. Telemetry so far has not shown any arrhythmia.   Chronic kidney disease stage IIIb. Acute kidney injury ruled out. Did not meet criteria for acute kidney injury.  But his renal function is slightly worse than baseline, creatinine still higher today.  Will give normal to 500 mL normal saline.  Recheck renal function tomorrow. Bladder scan did not show any urinary retention.  Chronic systolic congestive heart failure, Echocardiogram performed on 2020 showed ejection fraction 45 to 50%.  Will repeat echocardiogram today.  Patient does not have any volume overload at this time   HTN (hypertension) Blood pressure medicine on hold.  Adjust medication at the time of discharge.   CAD (coronary artery disease) cont ASA  Overweight. BMI 28.4   Code status. Discussed with the patient and son, also discussed with patient, he was previously placed on hospice, but since has recovered.  He still want to be full code.     Subjective:  Patient still feels  dizzy today, but no nausea vomiting.  He is also complaining left ankle pain, x-ray did not show any fracture.  Physical Exam: Vitals:   06/28/22 0052 06/28/22 0431 06/28/22 0500 06/28/22 0802  BP: 120/61 117/63  (!) 148/63  Pulse: (!) 108 (!) 58  (!) 53  Resp: 16 16  18   Temp: 98.4 F (36.9 C) 98.1 F (36.7 C)  98.6 F (37 C)  TempSrc: Oral Oral  Oral  SpO2: 100% 99%  100%  Weight:   87.3 kg   Height:       General exam: Appears calm and comfortable  Respiratory system: Clear to auscultation. Respiratory effort normal. Cardiovascular system: S1 & S2 heard, RRR. No JVD, murmurs, rubs, gallops or clicks. No pedal edema. Gastrointestinal system: Abdomen is nondistended, soft and nontender. No organomegaly or masses felt. Normal bowel sounds heard. Central nervous system: Alert and oriented x2. No focal neurological deficits. Extremities: Symmetric 5 x 5 power. Skin: No rashes, lesions or ulcers Psychiatry: Judgement and insight appear normal. Mood & affect appropriate.   Data Reviewed:  X-ray and lab results reviewed.  Family Communication: son updated  Disposition: Status is: Inpatient Remains inpatient appropriate because: Severity of disease, continue IV treatment.  Planned Discharge Destination:  TBD    Time spent: 50 minutes  Author: Sharen Hones, MD 06/28/2022 11:09 AM  For on call review [email or web address].

## 2022-06-28 NOTE — Evaluation (Signed)
Physical Therapy Evaluation Patient Details Name: Jerome [name]. MRN: [id number] DOB: 1934/12/05 Today's Date: 06/28/2022  History of Present Illness  Pt is an 88 y/o M admitted on 06/26/22 after presenting with chief concerns of syncope. Pt's BP was low upon presentation. PMH: HTN, BPH  Clinical Impression  Pt seen for PT evaluation with pt requiring heavy encouragement/education re: OOB mobility for PT assessment as pt reports he's been dizzy & unable to mobilize. Pt is able to complete bed mobility with heavy reliance on hospital bed features & min assist, STS with min assist & elevated EOB, & stand pivot with RW & CGA. Pt is limited by generalized weakness & limited BUE ROM, as well as LLE pain. Pt does c/o dizziness upon sitting EOB & once in recliner but vitals at end of session (~5 minutes after sitting), HR 52 bpm, BP in LUE 135/76 mmHg MAP 92. Pt reports he lives at home with his son who was recently in hospital for heart failure issues but is mobilizing without RW & attending OPPT, also notes his grandson comes by. At this time, recommend HHPT f/u & assistance from family. Will continue to follow pt acutely to address strengthening, activity tolerance, and gait.    Recommendations for follow up therapy are one component of a multi-disciplinary discharge planning process, led by the attending physician.  Recommendations may be updated based on patient status, additional functional criteria and insurance authorization.  Follow Up Recommendations Home health PT      Assistance Recommended at Discharge Frequent or constant Supervision/Assistance  Patient can return home with the following  A little help with bathing/dressing/bathroom;A little help with walking and/or transfers;Assistance with cooking/housework;Assist for transportation;Help with stairs or ramp for entrance    Equipment Recommendations None recommended by PT  Recommendations for Other Services  OT consult     Functional Status Assessment Patient has had a recent decline in their functional status and demonstrates the ability to make significant improvements in function in a reasonable and predictable amount of time.     Precautions / Restrictions Precautions Precautions: Fall Restrictions Weight Bearing Restrictions: No      Mobility  Bed Mobility Overal bed mobility: Needs Assistance Bed Mobility: Supine to Sit     Supine to sit: Min assist, HOB elevated     General bed mobility comments: heavy reliance on HOB elevated & bed rails, extra time to come to sitting EOB & scooting towards EOB    Transfers Overall transfer level: Needs assistance Equipment used: Rolling walker (2 wheels) Transfers: Sit to/from Stand, Bed to chair/wheelchair/BSC Sit to Stand: Min assist Stand pivot transfers: Min guard (with RW)         General transfer comment: STS from elevated EOB (pt with limited ROM in UE so elevated EOB allows pt to place hands on RW easier), time to power up to standing. Pt can complete stand pivot bed>Recliner with RW & CGA with decreased speed & extra time needed to weight shift L<>R & advance feet.    Ambulation/Gait                  Stairs            Wheelchair Mobility    Modified Rankin (Stroke Patients Only)       Balance Overall balance assessment: Needs assistance Sitting-balance support: Feet supported, Bilateral upper extremity supported Sitting balance-Leahy Scale: Fair     Standing balance support: Bilateral upper extremity supported, During functional activity, Reliant on  assistive device for balance Standing balance-Leahy Scale: Fair                               Pertinent Vitals/Pain Pain Assessment Pain Assessment: Faces Faces Pain Scale: Hurts little more Pain Location: LLE (L knee) Pain Descriptors / Indicators: Discomfort, Grimacing Pain Intervention(s): Monitored during session, Repositioned    Home Living  Family/patient expects to be discharged to:: Private residence Living Arrangements: Children Available Help at Discharge: Family Type of Home: House Home Access: Ramped entrance (at front door)       Home Layout: One level Home Equipment: Agricultural consultant (2 wheels);Wheelchair - manual      Prior Function Prior Level of Function : Needs assist             Mobility Comments: Pt utilized hospital bed but could manevuer OOB without assistance (albeit with difficulty), transfer & ambulate house hold distances about 2 times/day with RW & supervision. Pt denies recent falls. ADLs Comments: Performed peri hygiene with LUE 2/2 decreased RUE ROM.  Received meals on wheels. [employer] for transportation to appointments, otherwise rarely leaves his home.     Hand Dominance        Extremity/Trunk Assessment   Upper Extremity Assessment Upper Extremity Assessment: RUE deficits/detail RUE Deficits / Details: decreased shoulder ROM, great difficulty elevated arm to hold to bed rail to assist with supine>sit    Lower Extremity Assessment Lower Extremity Assessment: Generalized weakness       Communication   Communication: No difficulties  Cognition Arousal/Alertness: Awake/alert Behavior During Therapy: WFL for tasks assessed/performed Overall Cognitive Status: Within Functional Limits for tasks assessed                                 General Comments: Pt requires ongoing encouragement/education re: need for OOB mobility/participation in PT at this time; seems somewhat anxious re: falling & LLE pain with mobility.        General Comments General comments (skin integrity, edema, etc.): Pt endorses dizziness once sitting EOB & still after transferring to recliner but eventually notes dizziness subsides. No nystagmus observed. Pt reports he had 1 episode of dizziness about a week prior to this one/admission.    Exercises     Assessment/Plan    PT Assessment All further  PT needs can be met in the next venue of care  PT Problem List Decreased strength;Pain;Decreased range of motion;Decreased activity tolerance;Decreased balance;Decreased mobility;Decreased safety awareness;Decreased knowledge of use of DME       PT Treatment Interventions      PT Goals (Current goals can be found in the Care Plan section)  Acute Rehab PT Goals Patient Stated Goal: get stronger PT Goal Formulation: With patient Time For Goal Achievement: 07/12/22 Potential to Achieve Goals: Fair    Frequency       Co-evaluation               AM-PAC PT "6 Clicks" Mobility  Outcome Measure Help needed turning from your back to your side while in a flat bed without using bedrails?: A Little Help needed moving from lying on your back to sitting on the side of a flat bed without using bedrails?: A Lot Help needed moving to and from a bed to a chair (including a wheelchair)?: A Little Help needed standing up from a chair using your arms (e.g., wheelchair  or bedside chair)?: A Little Help needed to walk in hospital room?: A Little Help needed climbing 3-5 steps with a railing? : A Lot 6 Click Score: 16    End of Session Equipment Utilized During Treatment: Gait belt Activity Tolerance: Patient tolerated treatment well Patient left: in chair;with chair alarm set;with call bell/phone within reach Nurse Communication: Mobility status PT Visit Diagnosis: Muscle weakness (generalized) (M62.81);Pain;Difficulty in walking, not elsewhere classified (R26.2) Pain - Right/Left: Left Pain - part of body: Leg    Time: 5462-7035 PT Time Calculation (min) (ACUTE ONLY): 24 min   Charges:   PT Evaluation $PT Eval Moderate Complexity: [place], PT, DPT 06/28/22, 11:38 AM   [name] 06/28/2022, 11:35 AM

## 2022-06-28 NOTE — Evaluation (Signed)
Occupational Therapy Evaluation Patient Details Name: Jerome [name]. MRN: [id number] DOB: [birth year] Today's Date: 06/28/2022   History of Present Illness Pt is an 88 y/o M admitted on 06/26/22 after presenting with chief concerns of syncope. Pt's BP was low upon presentation. PMH: HTN, BPH   Clinical Impression   Patient presenting with decreased Ind in self care, balance, functional mobility/transfer, endurance, and safety awareness.Patient reports he lives at home with son who recently had some heart issues and is unable to physically assist at home. Pt sleeps in hospital bed and ambulates short distances of 10-15 feet to door and bathroom. He pays  caregiver privately to come on M,W,F for 1 hour to assist with ADLs and IADLs as needed. He gets meals on wheels but reports needing to be able to get to door to obtain meals. Pt sitting in recliner chair and needing mod lifting assistance to stand from recliner chair and ambulates 4' with RW and min A. He is very weak and fatigues easily. He is not a functional baseline and no family to assist at discharge. Patient will benefit from acute OT to increase overall independence in the areas of ADLs, functional mobility, and safety awareness in order to safely discharge to next venue of care.      Recommendations for follow up therapy are one component of a multi-disciplinary discharge planning process, led by the attending physician.  Recommendations may be updated based on patient status, additional functional criteria and insurance authorization.   Follow Up Recommendations  Skilled nursing-short term rehab (<3 hours/day)     Assistance Recommended at Discharge Intermittent Supervision/Assistance  Patient can return home with the following A little help with walking and/or transfers;A little help with bathing/dressing/bathroom;Help with stairs or ramp for entrance;Assist for transportation;Assistance with cooking/housework    Functional  Status Assessment  Patient has had a recent decline in their functional status and demonstrates the ability to make significant improvements in function in a reasonable and predictable amount of time.  Equipment Recommendations  Other (comment) (defer to next venue of care)       Precautions / Restrictions Precautions Precautions: Fall      Mobility Bed Mobility               General bed mobility comments: seated in recliner chair at beginning/end of session    Transfers Overall transfer level: Needs assistance Equipment used: Rolling walker (2 wheels) Transfers: Sit to/from Stand, Bed to chair/wheelchair/BSC Sit to Stand: Mod assist Stand pivot transfers: Min assist         General transfer comment: Pt needing mod lifting assistance to stand from recliner chair with cuing for anterior weight shift      Balance Overall balance assessment: Needs assistance Sitting-balance support: Feet supported, Bilateral upper extremity supported Sitting balance-Leahy Scale: Good     Standing balance support: Bilateral upper extremity supported, During functional activity, Reliant on assistive device for balance Standing balance-Leahy Scale: Fair                             ADL either performed or assessed with clinical judgement   ADL Overall ADL's : Needs assistance/impaired                     Lower Body Dressing: Moderate assistance;Sit to/from stand   Toilet Transfer: Minimal assistance;Rolling walker (2 wheels) Toilet Transfer Details (indicate cue type and reason): simulated  Vision Patient Visual Report: No change from baseline              Pertinent Vitals/Pain Pain Assessment Pain Assessment: Faces Faces Pain Scale: Hurts little more Pain Location: LLE (L knee) Pain Descriptors / Indicators: Discomfort, Grimacing Pain Intervention(s): Monitored during session, Repositioned        Extremity/Trunk Assessment  Upper Extremity Assessment Upper Extremity Assessment: RUE deficits/detail;Generalized weakness RUE Deficits / Details: decreased shoulder ROM   Lower Extremity Assessment Lower Extremity Assessment: Generalized weakness          Cognition Arousal/Alertness: Awake/alert Behavior During Therapy: WFL for tasks assessed/performed Overall Cognitive Status: Within Functional Limits for tasks assessed                                 General Comments: Pt is very pleasant and motivated for OT intervention.                Home Living Family/patient expects to be discharged to:: Private residence Living Arrangements: Children Available Help at Discharge: Family Type of Home: House Home Access: Ramped entrance     Home Layout: One level     Bathroom Shower/Tub: Sponge bathes at baseline   Bathroom Toilet: Handicapped height     Home Equipment: Agricultural consultant (2 wheels);Wheelchair - manual          Prior Functioning/Environment Prior Level of Function : Needs assist             Mobility Comments: Pt utilized hospital bed but could manevuer OOB without assistance (albeit with difficulty), transfer & ambulate house hold distances about 2 times/day with RW & supervision. Pt denies recent falls. ADLs Comments: Performed peri hygiene with LUE 2/2 decreased RUE ROM.  Received meals on wheels but has to be able to ambulate to door to get meals. [employer] for transportation to appointments, otherwise rarely leaves his home. Pt has paid caregiver on M, W, F for 1 hour to assist with self care and IADL tasks.        OT Problem List: Decreased strength;Decreased activity tolerance;Impaired balance (sitting and/or standing);Decreased safety awareness;Decreased knowledge of use of DME or AE      OT Treatment/Interventions: Self-care/ADL training;Therapeutic exercise;Therapeutic activities;Energy conservation;DME and/or AE instruction;Balance training;Patient/family  education    OT Goals(Current goals can be found in the care plan section) Acute Rehab OT Goals Patient Stated Goal: to go to rehab OT Goal Formulation: With patient Time For Goal Achievement: 07/12/22 Potential to Achieve Goals: Good ADL Goals Pt Will Perform Grooming: with supervision Pt Will Perform Lower Body Dressing: with supervision Pt Will Transfer to Toilet: with supervision Pt Will Perform Toileting - Clothing Manipulation and hygiene: with supervision  OT Frequency: Min 2X/week       AM-PAC OT "6 Clicks" Daily Activity     Outcome Measure Help from another person eating meals?: None Help from another person taking care of personal grooming?: A Little Help from another person toileting, which includes using toliet, bedpan, or urinal?: A Lot Help from another person bathing (including washing, rinsing, drying)?: A Lot Help from another person to put on and taking off regular upper body clothing?: A Little Help from another person to put on and taking off regular lower body clothing?: A Lot 6 Click Score: 16   End of Session Equipment Utilized During Treatment: Rolling walker (2 wheels) Nurse Communication: Mobility status  Activity Tolerance: Patient tolerated treatment well Patient  left: with call bell/phone within reach;in chair;with chair alarm set  OT Visit Diagnosis: Unsteadiness on feet (R26.81);Repeated falls (R29.6);Muscle weakness (generalized) (M62.81);History of falling (Z91.81)                Time: 3335-4562 OT Time Calculation (min): 22 min Charges:  OT General Charges $OT Visit: 1 Visit OT Evaluation $OT Eval Moderate Complexity: 1 Mod OT Treatments $Therapeutic Activity: 8-22 mins  Darleen Crocker, MS, OTR/L , CBIS ascom [phone]  06/28/22, 4:01 PM

## 2022-06-29 DIAGNOSIS — E871 Hypo-osmolality and hyponatremia: Secondary | ICD-10-CM

## 2022-06-29 DIAGNOSIS — L89620 Pressure ulcer of left heel, unstageable: Secondary | ICD-10-CM | POA: Insufficient documentation

## 2022-06-29 DIAGNOSIS — I5032 Chronic diastolic (congestive) heart failure: Secondary | ICD-10-CM | POA: Insufficient documentation

## 2022-06-29 DIAGNOSIS — D638 Anemia in other chronic diseases classified elsewhere: Secondary | ICD-10-CM | POA: Insufficient documentation

## 2022-06-29 LAB — CBC
HCT: 27.5 % — ABNORMAL LOW (ref 39.0–52.0)
Hemoglobin: 8.6 g/dL — ABNORMAL LOW (ref 13.0–17.0)
MCH: 31.4 pg (ref 26.0–34.0)
MCHC: 31.3 g/dL (ref 30.0–36.0)
MCV: 100.4 fL — ABNORMAL HIGH (ref 80.0–100.0)
Platelets: 189 10*3/uL (ref 150–400)
RBC: 2.74 MIL/uL — ABNORMAL LOW (ref 4.22–5.81)
RDW: 17.6 % — ABNORMAL HIGH (ref 11.5–15.5)
WBC: 7.7 10*3/uL (ref 4.0–10.5)
nRBC: 0 % (ref 0.0–0.2)

## 2022-06-29 LAB — ECHOCARDIOGRAM COMPLETE
Height: 69 in
S' Lateral: 2.9 cm
Weight: 3079.39 oz

## 2022-06-29 LAB — IRON AND TIBC
Iron: 26 ug/dL — ABNORMAL LOW (ref 45–182)
Saturation Ratios: 13 % — ABNORMAL LOW (ref 17.9–39.5)
TIBC: 206 ug/dL — ABNORMAL LOW (ref 250–450)
UIBC: 180 ug/dL

## 2022-06-29 LAB — BASIC METABOLIC PANEL
Anion gap: 7 (ref 5–15)
BUN: 38 mg/dL — ABNORMAL HIGH (ref 8–23)
CO2: 22 mmol/L (ref 22–32)
Calcium: 8.1 mg/dL — ABNORMAL LOW (ref 8.9–10.3)
Chloride: 102 mmol/L (ref 98–111)
Creatinine, Ser: 1.77 mg/dL — ABNORMAL HIGH (ref 0.61–1.24)
GFR, Estimated: 37 mL/min — ABNORMAL LOW (ref >60)
Glucose, Bld: 116 mg/dL — ABNORMAL HIGH (ref 70–99)
Potassium: 4.1 mmol/L (ref 3.5–5.1)
Sodium: 131 mmol/L — ABNORMAL LOW (ref 135–145)

## 2022-06-29 LAB — VITAMIN B12: Vitamin B-12: 1191 pg/mL — ABNORMAL HIGH (ref 180–914)

## 2022-06-29 LAB — MAGNESIUM: Magnesium: 2 mg/dL (ref 1.7–2.4)

## 2022-06-29 LAB — GLUCOSE, CAPILLARY: Glucose-Capillary: 127 mg/dL — ABNORMAL HIGH (ref 70–99)

## 2022-06-29 LAB — FERRITIN: Ferritin: 130 ng/mL (ref 24–336)

## 2022-06-29 NOTE — Progress Notes (Signed)
Physical Therapy Treatment Patient Details Name: Jerome [name]. MRN: [id number] DOB: [birth year] Today's Date: 06/29/2022   History of Present Illness Pt is an 88 y/o M admitted on 06/26/22 after presenting with chief concerns of syncope. Pt's BP was low upon presentation. PMH: HTN, BPH    PT Comments    Pt seen for PT tx with pt agreeable. Pt c/o ongoing LLE/L hip & L lower rib pain throughout mobility. Pt is able to complete bed mobility with supervision with heavy reliance on HOB elevated & bed rails. Pt is progressing to transferring STS with CGA fade to very close supervision with extra time to scoot out to edge of seat. Pt ambulates 10 ft + 10 ft with RW & CGA with decreased gait speed, flexed posture, and decreased step length BLE. Upon further discussion pt reports he is home alone at least half of the day & his son is unable to physically assist at d/c. Due to this, updating d/c recommendations to SNF as pt is unsafe to d/c home alone at this time.    Recommendations for follow up therapy are one component of a multi-disciplinary discharge planning process, led by the attending physician.  Recommendations may be updated based on patient status, additional functional criteria and insurance authorization.  Follow Up Recommendations  Skilled nursing-short term rehab (<3 hours/day) Can patient physically be transported by private vehicle: Yes   Assistance Recommended at Discharge Frequent or constant Supervision/Assistance  Patient can return home with the following A little help with bathing/dressing/bathroom;A little help with walking and/or transfers;Assistance with cooking/housework;Assist for transportation;Help with stairs or ramp for entrance   Equipment Recommendations  None recommended by PT    Recommendations for Other Services       Precautions / Restrictions Precautions Precautions: Fall Restrictions Weight Bearing Restrictions: No     Mobility  Bed  Mobility Overal bed mobility: Needs Assistance Bed Mobility: Supine to Sit     Supine to sit: Supervision     General bed mobility comments: extra time, reliance on HOB elevated & use of bed rails    Transfers Overall transfer level: Needs assistance Equipment used: Rolling walker (2 wheels) Transfers: Bed to chair/wheelchair/BSC, Sit to/from Stand Sit to Stand: Min guard, Supervision (CGA fade to very close supervision for STS from elevated EOB & recliner; pt requires extra time to scoot out to edge of seat in preparation for STS)   Step pivot transfers: Min guard (with RW)            Ambulation/Gait Ambulation/Gait assistance: Min guard Gait Distance (Feet): 10 Feet (+ 10 ft) Assistive device: Rolling walker (2 wheels) Gait Pattern/deviations: Decreased step length - right, Decreased step length - left, Decreased dorsiflexion - right, Decreased dorsiflexion - left, Decreased stride length Gait velocity: decreased     General Gait Details: Pt maintains L knee flexed throughout gait, forward flexed posture, reports this is baseline. Extra time to weight shift L<>R to take steps. Pt fatigued after each ambulation trial, requires seated rest break.   Stairs             Wheelchair Mobility    Modified Rankin (Stroke Patients Only)       Balance Overall balance assessment: Needs assistance Sitting-balance support: Feet supported, Bilateral upper extremity supported Sitting balance-Leahy Scale: Good     Standing balance support: Bilateral upper extremity supported, During functional activity, Reliant on assistive device for balance Standing balance-Leahy Scale: Fair  Cognition Arousal/Alertness: Awake/alert Behavior During Therapy: WFL for tasks assessed/performed Overall Cognitive Status: Within Functional Limits for tasks assessed                                          Exercises      General  Comments        Pertinent Vitals/Pain Pain Assessment Pain Assessment: Faces Faces Pain Scale: Hurts even more Pain Location: L hip/LE & L lower rib area Pain Descriptors / Indicators: Discomfort, Grimacing Pain Intervention(s): Limited activity within patient's tolerance, Monitored during session, Repositioned    Home Living                          Prior Function            PT Goals (current goals can now be found in the care plan section) Acute Rehab PT Goals Patient Stated Goal: get stronger PT Goal Formulation: With patient Time For Goal Achievement: 07/12/22 Potential to Achieve Goals: Fair Progress towards PT goals: Progressing toward goals    Frequency           PT Plan Discharge plan needs to be updated    Co-evaluation              AM-PAC PT "6 Clicks" Mobility   Outcome Measure  Help needed turning from your back to your side while in a flat bed without using bedrails?: A Little Help needed moving from lying on your back to sitting on the side of a flat bed without using bedrails?: A Lot Help needed moving to and from a bed to a chair (including a wheelchair)?: A Little Help needed standing up from a chair using your arms (e.g., wheelchair or bedside chair)?: A Little Help needed to walk in hospital room?: A Little Help needed climbing 3-5 steps with a railing? : A Lot 6 Click Score: 16    End of Session   Activity Tolerance: Patient tolerated treatment well Patient left: in chair;with chair alarm set;with call bell/phone within reach   PT Visit Diagnosis: Muscle weakness (generalized) (M62.81);Pain;Difficulty in walking, not elsewhere classified (R26.2) Pain - Right/Left: Left Pain - part of body: Leg (ribcage)     Time: 7169-6789 PT Time Calculation (min) (ACUTE ONLY): 25 min  Charges:  $Therapeutic Activity: 23-37 mins                     [name], PT, DPT 06/29/22, 9:51 AM   [name] 06/29/2022, 9:49  AM

## 2022-06-29 NOTE — TOC Progression Note (Signed)
Transition of Care [facility]) - Progression Note    Patient Details  Name: Jerome [name]. MRN: [id number] Date of Birth: [birth year]  Transition of Care [facility]) CM/SW Contact  Gerilyn Pilgrim, LCSW Phone Number: 06/29/2022, 9:59 AM  Clinical Narrative:  Pt recs have changed to SNF. [facility] and pt [facility]. CSW has sent referrals to these places. FL2 pending MD signature.     Expected Discharge Plan: [place] Barriers to Discharge: Continued Medical Work up  Expected Discharge Plan and Services     Post Acute Care Choice: Home Health                                         Social Determinants of Health (SDOH) Interventions SDOH Screenings   Food Insecurity: No Food Insecurity (06/27/2022)  Housing: Low Risk  (06/27/2022)  Transportation Needs: No Transportation Needs (06/27/2022)  Utilities: Not At Risk (06/27/2022)  Tobacco Use: Low Risk  (06/27/2022)    Readmission Risk Interventions     No data to display

## 2022-06-29 NOTE — TOC Progression Note (Addendum)
Transition of Care [facility]) - Progression Note    Patient Details  Name: Jerome [name]. MRN: [id number] Date of Birth: 12/07/1934  Transition of Care [facility]) CM/SW Contact  Gerilyn Pilgrim, LCSW Phone Number: 06/29/2022, 3:11 PM  Clinical Narrative:   [place] accepted for SNF. SW attempted to start auth on patient but unable to find pt in navi portal to start auth. LVM for patients insurance to give me a call.   3:22pm SW received message from [place] at [place] who states she has started [place] for patient.   Expected Discharge Plan: [place] Barriers to Discharge: Continued Medical Work up  Expected Discharge Plan and Services     Post Acute Care Choice: Home Health                                         Social Determinants of Health (SDOH) Interventions SDOH Screenings   Food Insecurity: No Food Insecurity (06/27/2022)  Housing: Low Risk  (06/27/2022)  Transportation Needs: No Transportation Needs (06/27/2022)  Utilities: Not At Risk (06/27/2022)  Tobacco Use: Low Risk  (06/27/2022)    Readmission Risk Interventions     No data to display

## 2022-06-29 NOTE — Progress Notes (Signed)
Progress Note   Patient: Jerome Jerome [name]. UKG:[id number] DOB: 03-28-1935 DOA: 06/26/2022     1 DOS: the patient was seen and examined on 06/29/2022   Brief hospital course: Mr. Jerome Jerome [name] is a 88 year old male with history of hypertension, BPH, who presents to the emergency department for chief concerns of syncope. Patient appears to be orthostatic and dizzy when standing.  Received IV fluids.  Patient also has slightly worsening renal function, was placed on IV fluids.   Assessment and Plan:  * Syncope with orthostatic hypotension UTI, ruled out - likely due to orthostasis, as BP was low on presentation, and pt felt dizzy upon standing.   - CT head without contrast and cervical spine was read as no acute intracranial findings  and no fractures seen in the cervical spine, respectively Did not have any dysuria, no evidence of UTI. Patient received IV fluids, feeling better today.   Chronic kidney disease stage IIIb. Hyponatremia. Acute kidney injury ruled out. Did not meet criteria for acute kidney injury.  But his renal function is slightly worse than baseline. Patient received fluids, renal function close to baseline.  Sodium level still low, but stable.  Anemia. Check iron B12 level.  No active bleeding.   Chronic diastolic congestive heart failure, No longer has a chronic systolic congestive heart failure. Echocardiogram performed on 2020 showed ejection fraction 45 to 50%.  Repeat echocardiogram showed ejection fraction 60 to 65% with severe concentric left ventricular hypertrophy. Patient currently does not have volume overload.   HTN (hypertension) Blood pressure medicine on hold.  Adjust medication at the time of discharge.   CAD (coronary artery disease) cont ASA   Overweight. BMI 28.4  Pressure ulcers POA Pressure Injury 06/27/22 Heel Left Unstageable - Full thickness tissue loss in which the base of the injury is covered by slough (yellow, tan, gray, green  or brown) and/or eschar (tan, brown or Jerome [name]) in the wound bed. 2.5cm x 1.2cm (Active)  06/27/22 0130  Location: Heel  Location Orientation: Left  Staging: Unstageable - Full thickness tissue loss in which the base of the injury is covered by slough (yellow, tan, gray, green or brown) and/or eschar (tan, brown or Jerome [name]) in the wound bed.  Wound Description (Comments): 2.5cm x 1.2cm  Present on Admission: Yes     Pressure Injury 06/27/22 Coccyx Right;Left Stage 1 -  Intact skin with non-blanchable redness of a localized area usually over a bony prominence. (Active)  06/27/22 0130  Location: Coccyx  Location Orientation: Right;Left  Staging: Stage 1 -  Intact skin with non-blanchable redness of a localized area usually over a bony prominence.  Wound Description (Comments):   Present on Admission: Yes   Wound care consult for heel ulcer      Subjective:  Patient feels better today, still significant weakness.  No shortness of breath.  Physical Exam: Vitals:   06/28/22 1640 06/28/22 1959 06/29/22 0420 06/29/22 0813  BP: (!) 117/57 114/60 (!) 130/59 123/68  Pulse: (!) 58 (!) 57 (!) 50 (!) 56  Resp: 18 20 19 18   Temp: 97.8 F (36.6 C) 98.5 F (36.9 C) 97.7 F (36.5 C) 98 F (36.7 C)  TempSrc: Oral Oral    SpO2: 96% 100% 100% 100%  Weight:      Height:       General exam: Appears calm and comfortable  Respiratory system: Clear to auscultation. Respiratory effort normal. Cardiovascular system: S1 & S2 heard, RRR. No JVD, murmurs, rubs, gallops or clicks.  No pedal edema. Gastrointestinal system: Abdomen is nondistended, soft and nontender. No organomegaly or masses felt. Normal bowel sounds heard. Central nervous system: Alert and oriented. No focal neurological deficits. Extremities: Symmetric 5 x 5 power. Skin: No rashes, lesions or ulcers Psychiatry: Judgement and insight appear normal. Mood & affect appropriate.   Data Reviewed:  Reviewed echocardiogram results, lab  results.  Family Communication:None  Disposition: Status is: Inpatient Remains inpatient appropriate because: Severity of disease, unsafe discharge.  Planned Discharge Destination: Skilled nursing facility    Time spent: 35 minutes  Author: Sharen Hones, MD 06/29/2022 11:56 AM  For on call review [email or web address].

## 2022-06-29 NOTE — NC FL2 (Signed)
[place] LEVEL OF CARE FORM     IDENTIFICATION  Patient Name: Jerome [name]. Birthdate: [birth year] Sex: male Admission Date (Current Location): 06/26/2022  [place] and [place] Number:  Engineering geologist and Address:  [facility], [address], [place], [place] [address]      Provider Number: [id number]  Attending Physician Name and Address:  Sharen Hones, MD  Relative Name and Phone Number:  [place], [place] [facility]) [phone]    Current Level of Care: Hospital Recommended Level of Care: [place] Prior Approval Number:    Date Approved/Denied:   PASRR Number: [id number]  Discharge Plan: SNF    Current Diagnoses: Patient Active Problem List   Diagnosis Date Noted   Hyponatremia 06/28/2022   Pressure injury of skin 06/28/2022   Overweight 06/28/2022   Syncope due to orthostatic hypotension 06/28/2022   Syncope 06/26/2022   Hx of total hip arthroplasty, right 01/21/2020   Sepsis ([place]) 84/13/2440   Chronic systolic CHF (congestive heart failure) ([place]) 03/20/2019   CAD (coronary artery disease) 03/20/2019   HTN (hypertension) 03/20/2019   Acute on chronic renal failure ([place]) 03/20/2019   Severe sepsis ([place]) 03/20/2019   Right leg pain 12/13/2018   Acute exacerbation of CHF (congestive heart failure) ([place]) 12/09/2018   Benign essential hypertension 01/12/2014   Gout 01/12/2014    Orientation RESPIRATION BLADDER Height & Weight     Self, Time, Situation, Place  Normal External catheter Weight: 192 lb 7.4 oz (87.3 kg) Height:  5\' 9"  (175.3 cm)  BEHAVIORAL SYMPTOMS/MOOD NEUROLOGICAL BOWEL NUTRITION STATUS      Continent Diet (Heart healthy)  AMBULATORY STATUS COMMUNICATION OF NEEDS Skin   Limited Assist Verbally PU Stage and Appropriate Care (L heel unstageable foam dressing change daily, R coccyx stage 1 foam dressing change PRN, skin tear L posterior foam dressing change PRN,  Back upper  Right, foam dressing PRN.)                       Personal Care Assistance Level of Assistance  Bathing, Feeding, Dressing Bathing Assistance: Limited assistance Feeding assistance: Limited assistance Dressing Assistance: Limited assistance     Functional Limitations Info  Sight, Hearing, Speech Sight Info: Adequate Hearing Info: Adequate Speech Info: Adequate    SPECIAL CARE FACTORS FREQUENCY  PT (By licensed PT), OT (By licensed OT)     PT Frequency: 5 times a week OT Frequency: 5 tiems a week            Contractures Contractures Info: Not present    Additional Factors Info  Code Status, Allergies Code Status Info: FULL Allergies Info: Gabapentin           Current Medications (06/29/2022):  This is the current hospital active medication list Current Facility-Administered Medications  Medication Dose Route Frequency Provider Last Rate Last Admin   acetaminophen (TYLENOL) tablet 650 mg  650 mg Oral Q6H PRN Cox, Amy N, DO   650 mg at 06/27/22 0207   Or   acetaminophen (TYLENOL) suppository 650 mg  650 mg Rectal Q6H PRN Cox, Amy N, DO       ascorbic acid (VITAMIN C) tablet 500 mg  500 mg Oral BID Cox, Amy N, DO   500 mg at 06/29/22 1027   aspirin EC tablet 81 mg  81 mg Oral Daily Cox, Amy N, DO   81 mg at 06/29/22 0814   heparin injection 5,000 Units  5,000 Units  Subcutaneous Q8H Cox, Amy N, DO   5,000 Units at 06/29/22 0542   hydrALAZINE (APRESOLINE) injection 5 mg  5 mg Intravenous Q8H PRN Cox, Amy N, DO       multivitamin with minerals tablet 1 tablet  1 tablet Oral Daily Cox, Amy N, DO   1 tablet at 06/29/22 0814   ondansetron (ZOFRAN) tablet 4 mg  4 mg Oral Q6H PRN Cox, Amy N, DO       Or   ondansetron (ZOFRAN) injection 4 mg  4 mg Intravenous Q6H PRN Cox, Amy N, DO       senna-docusate (Senokot-S) tablet 1 tablet  1 tablet Oral QHS PRN Cox, Amy N, DO       sodium chloride flush (NS) 0.9 % injection 3 mL  3 mL Intravenous Q12H Cox, Amy N, DO   3 mL at  06/29/22 0814   tamsulosin (FLOMAX) capsule 0.4 mg  0.4 mg Oral q1800 Cox, Amy N, DO   0.4 mg at 06/28/22 1722     Discharge Medications: Please see discharge summary for a list of discharge medications.  Relevant Imaging Results:  Relevant Lab Results:   Additional Information SS# [id number]  Gerilyn Pilgrim, LCSW

## 2022-06-29 NOTE — Consult Note (Signed)
WOC consult requested for left heel.  This was already performed on 1/9, and topical treatment orders have been provided for bedside nurses to perform. Please re-consult if further assistance is needed.  Thank-you,  Julien Girt MSN, [place], [place], [place], [place]

## 2022-06-30 DIAGNOSIS — I5032 Chronic diastolic (congestive) heart failure: Secondary | ICD-10-CM

## 2022-06-30 DIAGNOSIS — D509 Iron deficiency anemia, unspecified: Secondary | ICD-10-CM | POA: Insufficient documentation

## 2022-06-30 LAB — BASIC METABOLIC PANEL
Anion gap: 6 (ref 5–15)
BUN: 38 mg/dL — ABNORMAL HIGH (ref 8–23)
CO2: 23 mmol/L (ref 22–32)
Calcium: 8.2 mg/dL — ABNORMAL LOW (ref 8.9–10.3)
Chloride: 102 mmol/L (ref 98–111)
Creatinine, Ser: 1.7 mg/dL — ABNORMAL HIGH (ref 0.61–1.24)
GFR, Estimated: 39 mL/min — ABNORMAL LOW (ref >60)
Glucose, Bld: 87 mg/dL (ref 70–99)
Potassium: 4.3 mmol/L (ref 3.5–5.1)
Sodium: 131 mmol/L — ABNORMAL LOW (ref 135–145)

## 2022-06-30 LAB — CBC
HCT: 26.8 % — ABNORMAL LOW (ref 39.0–52.0)
Hemoglobin: 8.5 g/dL — ABNORMAL LOW (ref 13.0–17.0)
MCH: 32 pg (ref 26.0–34.0)
MCHC: 31.7 g/dL (ref 30.0–36.0)
MCV: 100.8 fL — ABNORMAL HIGH (ref 80.0–100.0)
Platelets: 189 10*3/uL (ref 150–400)
RBC: 2.66 MIL/uL — ABNORMAL LOW (ref 4.22–5.81)
RDW: 17.8 % — ABNORMAL HIGH (ref 11.5–15.5)
WBC: 7.5 10*3/uL (ref 4.0–10.5)
nRBC: 0 % (ref 0.0–0.2)

## 2022-06-30 LAB — MAGNESIUM: Magnesium: 2.1 mg/dL (ref 1.7–2.4)

## 2022-06-30 LAB — GLUCOSE, CAPILLARY: Glucose-Capillary: 90 mg/dL (ref 70–99)

## 2022-06-30 MED ORDER — POLYSACCHARIDE IRON COMPLEX 150 MG PO CAPS
150.0000 mg | ORAL_CAPSULE | Freq: Every day | ORAL | Status: DC
  Administered 2022-06-30 – 2022-07-03 (×4): 150 mg via ORAL
  Filled 2022-06-30 (×4): qty 1

## 2022-06-30 NOTE — Progress Notes (Addendum)
Physical Therapy Treatment Patient Details Name: Jerome [name]. MRN: [id number] DOB: 10/01/1934 Today's Date: 06/30/2022   History of Present Illness Pt is an 88 y/o M admitted on 06/26/22 after presenting with chief concerns of syncope. Pt's BP was low upon presentation. PMH: HTN, BPH    PT Comments    Pt seen for PT tx with pt agreeable, son present in room providing encouragement. Pt is able to complete STS with CGA & extra time to power up & increase distances, ambulating out in hallway x 2 trials with RW & CGA on this date. Pt did require seated rest breaks between ambulation trials with pt noting some lightheadedness, but not dizziness, that went away quickly with seated rest & cuing for pursed lip breathing. Pt would still benefit from STR upon d/c to maximize independence with mobility, decrease fall risk & decrease caregiver burden as pt's son cannot provide physical assistance at d/c.    Recommendations for follow up therapy are one component of a multi-disciplinary discharge planning process, led by the attending physician.  Recommendations may be updated based on patient status, additional functional criteria and insurance authorization.  Follow Up Recommendations  Skilled nursing-short term rehab (<3 hours/day) Can patient physically be transported by private vehicle: Yes   Assistance Recommended at Discharge Frequent or constant Supervision/Assistance  Patient can return home with the following A little help with bathing/dressing/bathroom;A little help with walking and/or transfers;Assistance with cooking/housework;Assist for transportation;Help with stairs or ramp for entrance   Equipment Recommendations  None recommended by PT    Recommendations for Other Services       Precautions / Restrictions Precautions Precautions: Fall Restrictions Weight Bearing Restrictions: No     Mobility  Bed Mobility               General bed mobility comments: not  tested, pt received & left sitting in recliner    Transfers Overall transfer level: Needs assistance Equipment used: Rolling walker (2 wheels) Transfers: Sit to/from Stand Sit to Stand: Min guard           General transfer comment: STS from recliner x 2, extra time to scoot out to edge of seat & to power up to standing    Ambulation/Gait Ambulation/Gait assistance: Min guard Gait Distance (Feet): 30 Feet (+ 30 ft) Assistive device: Rolling walker (2 wheels) Gait Pattern/deviations: Decreased step length - right, Decreased step length - left, Decreased stride length, Decreased dorsiflexion - right, Decreased dorsiflexion - left Gait velocity: decreased     General Gait Details: Trunk flexed, decreased heel strike BLE, L knee & hip flexed throughout gait.   Stairs             Wheelchair Mobility    Modified Rankin (Stroke Patients Only)       Balance Overall balance assessment: Needs assistance Sitting-balance support: Feet supported, Bilateral upper extremity supported Sitting balance-Leahy Scale: Good     Standing balance support: Bilateral upper extremity supported, During functional activity, Reliant on assistive device for balance Standing balance-Leahy Scale: Fair                              Cognition Arousal/Alertness: Awake/alert Behavior During Therapy: WFL for tasks assessed/performed Overall Cognitive Status: Within Functional Limits for tasks assessed  Exercises      General Comments        Pertinent Vitals/Pain Pain Assessment Pain Assessment: Faces Faces Pain Scale: Hurts a little bit Pain Location: L lower rib area Pain Descriptors / Indicators: Discomfort Pain Intervention(s): Monitored during session    Home Living                          Prior Function            PT Goals (current goals can now be found in the care plan section) Acute Rehab  PT Goals Patient Stated Goal: get stronger PT Goal Formulation: With patient Time For Goal Achievement: 07/12/22 Potential to Achieve Goals: Fair Progress towards PT goals: Progressing toward goals    Frequency    Min 2X/week      PT Plan Current plan remains appropriate    Co-evaluation              AM-PAC PT "6 Clicks" Mobility   Outcome Measure  Help needed turning from your back to your side while in a flat bed without using bedrails?: A Little Help needed moving from lying on your back to sitting on the side of a flat bed without using bedrails?: A Lot Help needed moving to and from a bed to a chair (including a wheelchair)?: A Little Help needed standing up from a chair using your arms (e.g., wheelchair or bedside chair)?: A Little Help needed to walk in hospital room?: A Little Help needed climbing 3-5 steps with a railing? : A Lot 6 Click Score: 16    End of Session   Activity Tolerance: Patient tolerated treatment well Patient left: in chair;with chair alarm set;with call bell/phone within reach;with family/visitor present   PT Visit Diagnosis: Muscle weakness (generalized) (M62.81);Difficulty in walking, not elsewhere classified (R26.2)     Time: 7169-6789 PT Time Calculation (min) (ACUTE ONLY): 20 min  Charges:  $Therapeutic Activity: 8-22 mins                     [name], PT, DPT 06/30/22, 4:11 PM   [name] 06/30/2022, 4:08 PM

## 2022-06-30 NOTE — Progress Notes (Signed)
Occupational Therapy Treatment Patient Details Name: Jerome [name]. MRN: [id number] DOB: [birth year] Today's Date: 06/30/2022   History of present illness Pt is an 88 y/o M admitted on 06/26/22 after presenting with chief concerns of syncope. Pt's BP was low upon presentation. PMH: HTN, BPH   OT comments  Upon entering the room, pt seated on [facility] with RN present in room. Pt with multiple bouts of diarrhea in BSC. Pt stands with mod lifting assistance and stands with RW while therapist assists with hygiene after BM. Pt then turns RW in tight space to step pivot back to recliner chair with min A. Pt is fatigued from efforts and bouts of diarrhea. Call bell and all needed items within reach. Pt continues to benefit from OT intervention with recommendation for SNF at discharge to address functional deficits.    Recommendations for follow up therapy are one component of a multi-disciplinary discharge planning process, led by the attending physician.  Recommendations may be updated based on patient status, additional functional criteria and insurance authorization.    Follow Up Recommendations  Skilled nursing-short term rehab (<3 hours/day)     Assistance Recommended at Discharge Intermittent Supervision/Assistance  Patient can return home with the following  A little help with walking and/or transfers;A little help with bathing/dressing/bathroom;Help with stairs or ramp for entrance;Assist for transportation;Assistance with cooking/housework   Equipment Recommendations  Other (comment) (defer to next venue of care)       Precautions / Restrictions Precautions Precautions: Fall Restrictions Weight Bearing Restrictions: No       Mobility Bed Mobility               General bed mobility comments: seated in recliner chair    Transfers Overall transfer level: Needs assistance Equipment used: Rolling walker (2 wheels) Transfers: Bed to chair/wheelchair/BSC, Sit to/from  Stand Sit to Stand: Mod assist     Step pivot transfers: Min assist           Balance Overall balance assessment: Needs assistance Sitting-balance support: Feet supported, Bilateral upper extremity supported Sitting balance-Leahy Scale: Good     Standing balance support: Bilateral upper extremity supported, During functional activity, Reliant on assistive device for balance Standing balance-Leahy Scale: Fair                             ADL either performed or assessed with clinical judgement   ADL Overall ADL's : Needs assistance/impaired                         Toilet Transfer: Moderate assistance;BSC/3in1;Rolling walker (2 wheels) Toilet Transfer Details (indicate cue type and reason): mod lifing assistance Toileting- Clothing Manipulation and Hygiene: Sit to/from stand;Moderate assistance Toileting - Clothing Manipulation Details (indicate cue type and reason): assistance for clothing management and hygiene     Functional mobility during ADLs: Minimal assistance;Rolling walker (2 wheels)        Cognition Arousal/Alertness: Awake/alert Behavior During Therapy: WFL for tasks assessed/performed Overall Cognitive Status: Within Functional Limits for tasks assessed                                 General Comments: Pt is very pleasant and motivated                   Pertinent Vitals/ Pain       Pain  Assessment Pain Assessment: No/denies pain         Frequency  Min 2X/week        Progress Toward Goals  OT Goals(current goals can now be found in the care plan section)  Progress towards OT goals: Progressing toward goals  Acute Rehab OT Goals Patient Stated Goal: to go to rehab OT Goal Formulation: With patient Time For Goal Achievement: 07/12/22 Potential to Achieve Goals: Good  Plan Discharge plan remains appropriate;Frequency remains appropriate       AM-PAC OT "6 Clicks" Daily Activity     Outcome  Measure   Help from another person eating meals?: None Help from another person taking care of personal grooming?: A Little Help from another person toileting, which includes using toliet, bedpan, or urinal?: A Lot Help from another person bathing (including washing, rinsing, drying)?: A Lot Help from another person to put on and taking off regular upper body clothing?: A Little Help from another person to put on and taking off regular lower body clothing?: A Lot 6 Click Score: 16    End of Session Equipment Utilized During Treatment: Rolling walker (2 wheels)  OT Visit Diagnosis: Unsteadiness on feet (R26.81);Repeated falls (R29.6);Muscle weakness (generalized) (M62.81);History of falling (Z91.81)   Activity Tolerance Patient tolerated treatment well   Patient Left with call bell/phone within reach;in chair;with chair alarm set   Nurse Communication Mobility status        Time: 0814-4818 OT Time Calculation (min): 28 min  Charges: OT General Charges $OT Visit: 1 Visit OT Treatments $Self Care/Home Management : 23-37 mins  Darleen Crocker, MS, OTR/L , CBIS ascom [phone]  06/30/22, 2:52 PM

## 2022-06-30 NOTE — TOC Progression Note (Signed)
Transition of Care [facility]) - Progression Note    Patient Details  Name: Jerome [name]. MRN: [id number] Date of Birth: September 30, 1934  [facility]) CM/SW Contact  Gerilyn Pilgrim, LCSW Phone Number: 06/30/2022, 11:50 AM  Clinical Narrative:   SW spoke with son Jerome [name] regarding needing custodial care david verbalizes understanding that this may be a possibility.   Expected Discharge Plan: [place]  Barriers to Discharge: Continued Medical Work up  Expected Discharge Plan and Services     Post Acute Care Choice: Home Health                                         Social Determinants of Health (SDOH) Interventions SDOH Screenings   Food Insecurity: No Food Insecurity (06/27/2022)  Housing: Low Risk  (06/27/2022)  Transportation Needs: No Transportation Needs (06/27/2022)  Utilities: Not At Risk (06/27/2022)  Tobacco Use: Low Risk  (06/27/2022)    Readmission Risk Interventions     No data to display

## 2022-06-30 NOTE — TOC Progression Note (Signed)
Transition of Care [facility]) - Progression Note    Patient Details  Name: Jerome [name]. MRN: [id number] Date of Birth: [birth year]  Transition of Care [facility]) CM/SW Contact  Gerilyn Pilgrim, LCSW Phone Number: 06/30/2022, 11:26 AM  Clinical Narrative:   SW spoke with patient [name] at [facility] regarding pt auth [facility]. She states the case has been sent to the medical director for a determination.     Expected Discharge Plan: [place] Barriers to Discharge: Continued Medical Work up  Expected Discharge Plan and Services     Post Acute Care Choice: Home Health                                         Social Determinants of Health (SDOH) Interventions SDOH Screenings   Food Insecurity: No Food Insecurity (06/27/2022)  Housing: Low Risk  (06/27/2022)  Transportation Needs: No Transportation Needs (06/27/2022)  Utilities: Not At Risk (06/27/2022)  Tobacco Use: Low Risk  (06/27/2022)    Readmission Risk Interventions     No data to display

## 2022-06-30 NOTE — Care Management Important Message (Signed)
Important Message  Patient Details  Name: Jerome [name]. MRN: [id number] Date of Birth: 1934-12-09   Medicare Important Message Given:  Yes     Juliann Pulse A Sonya Pucci 06/30/2022, 11:14 AM

## 2022-06-30 NOTE — Consult Note (Signed)
WOC consulted for left heel; please see notes from 1/9 and 1/11 for same.  [place], [place], [place]

## 2022-06-30 NOTE — Progress Notes (Signed)
Progress Note   Patient: Jerome [name]. [id number]:[id number] DOB: 1935-02-09 DOA: 06/26/2022     2 DOS: the patient was seen and examined on 06/30/2022   Brief hospital course: [name] is a 88 year old male with history of hypertension, BPH, who presents to the emergency department for chief concerns of syncope. Patient appears to be orthostatic and dizzy when standing.  Received IV fluids.  Patient also has slightly worsening renal function, was placed on IV fluids. Patient condition has been improved, currently pending nursing placement  Assessment and Plan:   Syncope with orthostatic hypotension UTI, ruled out - likely due to orthostasis, as BP was low on presentation, and pt felt dizzy upon standing.   - CT head without contrast and cervical spine was read as no acute intracranial findings  and no fractures seen in the cervical spine, respectively Did not have any dysuria, no evidence of UTI. Patient improved.   Chronic kidney disease stage IIIb. Hyponatremia. Acute kidney injury ruled out. Did not meet criteria for acute kidney injury.  But his renal function is slightly worse than baseline. Patient received fluids, renal function close to baseline.  Sodium level still low, but stable.   Iron deficient anemia. Start oral iron treatment, B12 level elevated.  Hemoglobin stable.   Chronic diastolic congestive heart failure, No longer has a chronic systolic congestive heart failure. Echocardiogram performed on 2020 showed ejection fraction 45 to 50%.  Repeat echocardiogram showed ejection fraction 60 to 65% with severe concentric left ventricular hypertrophy. Patient currently does not have volume overload.   HTN (hypertension) Blood pressure medicine on hold.  Adjust medication at the time of discharge.   CAD (coronary artery disease) cont ASA   Overweight. BMI 28.4   Pressure ulcers POA Pressure Injury 06/27/22 Heel Left Unstageable - Full thickness tissue  loss in which the base of the injury is covered by slough (yellow, tan, gray, green or brown) and/or eschar (tan, brown or black) in the wound bed. 2.5cm x 1.2cm (Active)  06/27/22 0130  Location: Heel  Location Orientation: Left  Staging: Unstageable - Full thickness tissue loss in which the base of the injury is covered by slough (yellow, tan, gray, green or brown) and/or eschar (tan, brown or black) in the wound bed.  Wound Description (Comments): 2.5cm x 1.2cm  Present on Admission: Yes     Pressure Injury 06/27/22 Coccyx Right;Left Stage 1 -  Intact skin with non-blanchable redness of a localized area usually over a bony prominence. (Active)  06/27/22 0130  Location: Coccyx  Location Orientation: Right;Left  Staging: Stage 1 -  Intact skin with non-blanchable redness of a localized area usually over a bony prominence.  Wound Description (Comments):   Present on Admission: Yes    Wound care consult for heel ulcer       Subjective:  Patient doing well today, still complain weakness.  Otherwise no shortness of breath.  Appetite is okay.  Physical Exam: Vitals:   06/29/22 1543 06/29/22 2019 06/30/22 0524 06/30/22 0801  BP: (!) 114/58 (!) 122/58 (!) 116/53 133/63  Pulse: (!) 57 (!) 56 (!) 57 (!) 47  Resp: 18 18 18 16   Temp: 98.2 F (36.8 C) 97.7 F (36.5 C) 97.9 F (36.6 C) 98.6 F (37 C)  TempSrc: Oral Oral Oral   SpO2: 100% 100% 100% 96%  Weight:      Height:       General exam: Appears calm and comfortable  Respiratory system: Clear to auscultation.  Respiratory effort normal. Cardiovascular system: S1 & S2 heard, RRR. No JVD, murmurs, rubs, gallops or clicks. No pedal edema. Gastrointestinal system: Abdomen is nondistended, soft and nontender. No organomegaly or masses felt. Normal bowel sounds heard. Central nervous system: Alert and oriented. No focal neurological deficits. Extremities: Left heel wound Skin: No rashes, lesions or ulcers Psychiatry: Judgement and  insight appear normal. Mood & affect appropriate.   Data Reviewed:  Lab results reviewed.  Family Communication: None  Disposition: Status is: Inpatient Remains inpatient appropriate because: Unsafe discharge.  Planned Discharge Destination: Skilled nursing facility    Time spent: 35 minutes  Author: Sharen Hones, MD 06/30/2022 12:20 PM  For on call review [email or web address].

## 2022-07-01 DIAGNOSIS — I951 Orthostatic hypotension: Principal | ICD-10-CM

## 2022-07-01 DIAGNOSIS — N1832 Chronic kidney disease, stage 3b: Secondary | ICD-10-CM

## 2022-07-01 DIAGNOSIS — D638 Anemia in other chronic diseases classified elsewhere: Secondary | ICD-10-CM

## 2022-07-01 DIAGNOSIS — E871 Hypo-osmolality and hyponatremia: Secondary | ICD-10-CM

## 2022-07-01 LAB — GLUCOSE, CAPILLARY: Glucose-Capillary: 95 mg/dL (ref 70–99)

## 2022-07-01 LAB — CBC
HCT: 27.2 % — ABNORMAL LOW (ref 39.0–52.0)
Hemoglobin: 8.6 g/dL — ABNORMAL LOW (ref 13.0–17.0)
MCH: 31.4 pg (ref 26.0–34.0)
MCHC: 31.6 g/dL (ref 30.0–36.0)
MCV: 99.3 fL (ref 80.0–100.0)
Platelets: 201 10*3/uL (ref 150–400)
RBC: 2.74 MIL/uL — ABNORMAL LOW (ref 4.22–5.81)
RDW: 17.3 % — ABNORMAL HIGH (ref 11.5–15.5)
WBC: 8.3 10*3/uL (ref 4.0–10.5)
nRBC: 0 % (ref 0.0–0.2)

## 2022-07-01 LAB — BASIC METABOLIC PANEL
Anion gap: 7 (ref 5–15)
BUN: 39 mg/dL — ABNORMAL HIGH (ref 8–23)
CO2: 23 mmol/L (ref 22–32)
Calcium: 8.5 mg/dL — ABNORMAL LOW (ref 8.9–10.3)
Chloride: 102 mmol/L (ref 98–111)
Creatinine, Ser: 1.71 mg/dL — ABNORMAL HIGH (ref 0.61–1.24)
GFR, Estimated: 39 mL/min — ABNORMAL LOW (ref >60)
Glucose, Bld: 86 mg/dL (ref 70–99)
Potassium: 4.4 mmol/L (ref 3.5–5.1)
Sodium: 132 mmol/L — ABNORMAL LOW (ref 135–145)

## 2022-07-01 LAB — MAGNESIUM: Magnesium: 2.2 mg/dL (ref 1.7–2.4)

## 2022-07-01 MED ORDER — POLYSACCHARIDE IRON COMPLEX 150 MG PO CAPS: 150.0000 mg | ORAL_CAPSULE | Freq: Every day | ORAL | Status: DC

## 2022-07-01 MED ORDER — LOPERAMIDE HCL 2 MG PO CAPS
2.0000 mg | ORAL_CAPSULE | ORAL | Status: DC | PRN
  Administered 2022-07-01: 2 mg via ORAL
  Filled 2022-07-01: qty 1

## 2022-07-01 NOTE — Progress Notes (Signed)
Progress Note   Patient: Jerome [name]. [id number]:[id number] DOB: Jan 26, 1935 DOA: 06/26/2022     3 DOS: the patient was seen and examined on 07/01/2022   Brief hospital course: [name] is a 88 year old male with history of hypertension, BPH, who presents to the emergency department for chief concerns of syncope. Patient appears to be orthostatic and dizzy when standing.  Received IV fluids.  Patient also has slightly worsening renal function, was placed on IV fluids. Patient condition has been improved, currently pending nursing placement  Assessment and Plan: Syncope with orthostatic hypotension UTI, ruled out - likely due to orthostasis, as BP was low on presentation, and pt felt dizzy upon standing.   - CT head without contrast and cervical spine was read as no acute intracranial findings  and no fractures seen in the cervical spine, respectively Did not have any dysuria, no evidence of UTI. No longer feels dizzy, condition stable.   Chronic kidney disease stage IIIb. Hyponatremia. Acute kidney injury ruled out. Did not meet criteria for acute kidney injury.  But his renal function is slightly worse than baseline. Patient received fluids, renal function close to baseline.  Sodium level still low, but stable.   Iron deficient anemia. Started oral iron treatment, B12 level elevated.  Hemoglobin stable.   Chronic diastolic congestive heart failure, No longer has a chronic systolic congestive heart failure. Echocardiogram performed on 2020 showed ejection fraction 45 to 50%.  Repeat echocardiogram showed ejection fraction 60 to 65% with severe concentric left ventricular hypertrophy. Patient currently does not have volume overload.   HTN (hypertension) Blood pressure medicine on hold.  Adjust medication at the time of discharge.   CAD (coronary artery disease) cont ASA   Overweight. BMI 28.4   Pressure ulcers POA Pressure Injury 06/27/22 Heel Left Unstageable -  Full thickness tissue loss in which the base of the injury is covered by slough (yellow, tan, gray, green or brown) and/or eschar (tan, brown or black) in the wound bed. 2.5cm x 1.2cm (Active)  06/27/22 0130  Location: Heel  Location Orientation: Left  Staging: Unstageable - Full thickness tissue loss in which the base of the injury is covered by slough (yellow, tan, gray, green or brown) and/or eschar (tan, brown or black) in the wound bed.  Wound Description (Comments): 2.5cm x 1.2cm  Present on Admission: Yes     Pressure Injury 06/27/22 Coccyx Right;Left Stage 1 -  Intact skin with non-blanchable redness of a localized area usually over a bony prominence. (Active)  06/27/22 0130  Location: Coccyx  Location Orientation: Right;Left  Staging: Stage 1 -  Intact skin with non-blanchable redness of a localized area usually over a bony prominence.  Wound Description (Comments):   Present on Admission: Yes    Per wound care.        Subjective:  Patient still has signal weakness, otherwise condition stable.  Physical Exam: Vitals:   06/30/22 1650 06/30/22 2132 07/01/22 0652 07/01/22 0837  BP: 117/64 (!) 113/59 131/64 133/68  Pulse: 61 (!) 57 (!) 58 (!) 53  Resp: 18 18 18 18   Temp: 98 F (36.7 C) 97.9 F (36.6 C) 98 F (36.7 C) 98 F (36.7 C)  TempSrc:   Oral   SpO2: 100% 100% 100% 100%  Weight:      Height:       General exam: Appears calm and comfortable  Respiratory system: Clear to auscultation. Respiratory effort normal. Cardiovascular system: S1 & S2 heard, RRR. No JVD,  murmurs, rubs, gallops or clicks. No pedal edema. Gastrointestinal system: Abdomen is nondistended, soft and nontender. No organomegaly or masses felt. Normal bowel sounds heard. Central nervous system: Alert and oriented. No focal neurological deficits. Extremities: Symmetric 5 x 5 power. Skin: No rashes, lesions or ulcers Psychiatry: Judgement and insight appear normal. Mood & affect appropriate.    Data Reviewed:  There are no new results to review at this time.  Family Communication: None  Disposition: Status is: Inpatient Remains inpatient appropriate because: Unsafe discharge.  Planned Discharge Destination: Skilled nursing facility    Time spent: 35 minutes  Author: Sharen Hones, MD 07/01/2022 10:43 AM  For on call review [email or web address].

## 2022-07-01 NOTE — TOC Progression Note (Addendum)
Transition of Care [facility]) - Progression Note    Patient Details  Name: Jerome [name]. MRN: [id number] Date of Birth: 09/05/1934  [facility] CM/SW Contact  Izola Price, RN Phone Number: 07/01/2022, 12:53 PM  Clinical Narrative: 1/13: Marlowe Kays from [facility] called this RN CM with insurance authorization.    SNF: #[id number] EMS [phone] Good for today and 4 business days starting Monday. 07/03/22.  Left VM with Magda Paganini at [facility] about returning at 1220 pm. Simmie Davies RN CM    210 pm. Called main number with directions to connect to nurses on the floor at #[phone] or #19 with no answer at either. Also sent secure text to [facility] again to see if admitting over weekend. Simmie Davies RN CM   Expected Discharge Plan: [place] Barriers to Discharge: Continued Medical Work up  Expected Discharge Plan and Services     Post Acute Care Choice: Home Health                                         Social Determinants of Health (SDOH) Interventions SDOH Screenings   Food Insecurity: No Food Insecurity (06/27/2022)  Housing: Low Risk  (06/27/2022)  Transportation Needs: No Transportation Needs (06/27/2022)  Utilities: Not At Risk (06/27/2022)  Tobacco Use: Low Risk  (06/27/2022)    Readmission Risk Interventions     No data to display

## 2022-07-02 DIAGNOSIS — D508 Other iron deficiency anemias: Secondary | ICD-10-CM

## 2022-07-02 DIAGNOSIS — I5032 Chronic diastolic (congestive) heart failure: Secondary | ICD-10-CM

## 2022-07-02 LAB — BASIC METABOLIC PANEL
Anion gap: 8 (ref 5–15)
BUN: 38 mg/dL — ABNORMAL HIGH (ref 8–23)
CO2: 22 mmol/L (ref 22–32)
Calcium: 8.5 mg/dL — ABNORMAL LOW (ref 8.9–10.3)
Chloride: 101 mmol/L (ref 98–111)
Creatinine, Ser: 1.73 mg/dL — ABNORMAL HIGH (ref 0.61–1.24)
GFR, Estimated: 38 mL/min — ABNORMAL LOW (ref >60)
Glucose, Bld: 83 mg/dL (ref 70–99)
Potassium: 4.3 mmol/L (ref 3.5–5.1)
Sodium: 131 mmol/L — ABNORMAL LOW (ref 135–145)

## 2022-07-02 LAB — CULTURE, BLOOD (ROUTINE X 2)
Culture: NO GROWTH
Culture: NO GROWTH
Special Requests: ADEQUATE
Special Requests: ADEQUATE

## 2022-07-02 LAB — CBC
HCT: 27.8 % — ABNORMAL LOW (ref 39.0–52.0)
Hemoglobin: 8.7 g/dL — ABNORMAL LOW (ref 13.0–17.0)
MCH: 31.4 pg (ref 26.0–34.0)
MCHC: 31.3 g/dL (ref 30.0–36.0)
MCV: 100.4 fL — ABNORMAL HIGH (ref 80.0–100.0)
Platelets: 202 10*3/uL (ref 150–400)
RBC: 2.77 MIL/uL — ABNORMAL LOW (ref 4.22–5.81)
RDW: 17.3 % — ABNORMAL HIGH (ref 11.5–15.5)
WBC: 7.4 10*3/uL (ref 4.0–10.5)
nRBC: 0 % (ref 0.0–0.2)

## 2022-07-02 LAB — MAGNESIUM: Magnesium: 2.2 mg/dL (ref 1.7–2.4)

## 2022-07-02 LAB — GLUCOSE, CAPILLARY
Glucose-Capillary: 88 mg/dL (ref 70–99)
Glucose-Capillary: 109 mg/dL — ABNORMAL HIGH (ref 70–99)

## 2022-07-02 NOTE — Progress Notes (Signed)
Progress Note   Patient: Jerome [name]. [id number]:[id number] DOB: December 24, 1934 DOA: 06/26/2022     4 DOS: the patient was seen and examined on 07/02/2022   Brief hospital course: Mr. Tyqwan Pink is a 88 year old male with history of hypertension, BPH, who presents to the emergency department for chief concerns of syncope. Patient appears to be orthostatic and dizzy when standing.  Received IV fluids.  Patient also has slightly worsening renal function, was placed on IV fluids. Patient condition has been improved, currently pending nursing placement  Assessment and Plan: Syncope with orthostatic hypotension UTI, ruled out - likely due to orthostasis, as BP was low on presentation, and pt felt dizzy upon standing.   - CT head without contrast and cervical spine was read as no acute intracranial findings  and no fractures seen in the cervical spine, respectively Did not have any dysuria, no evidence of UTI. No longer feels dizzy, condition stable.   Chronic kidney disease stage IIIb. Hyponatremia. Acute kidney injury ruled out. Did not meet criteria for acute kidney injury.  But his renal function is slightly worse than baseline. Patient received fluids, renal function close to baseline.  Sodium level still low, but stable.   Iron deficient anemia. Started oral iron treatment, B12 level elevated.  Hemoglobin stable.   Chronic diastolic congestive heart failure, No longer has a chronic systolic congestive heart failure. Echocardiogram performed on 2020 showed ejection fraction 45 to 50%.  Repeat echocardiogram showed ejection fraction 60 to 65% with severe concentric left ventricular hypertrophy. Patient currently does not have volume overload.   HTN (hypertension) Blood pressure medicine on hold.  Adjust medication at the time of discharge.   CAD (coronary artery disease) cont ASA   Overweight. BMI 28.4   Pressure ulcers POA Pressure Injury 06/27/22 Heel Left Unstageable -  Full thickness tissue loss in which the base of the injury is covered by slough (yellow, tan, gray, green or brown) and/or eschar (tan, brown or black) in the wound bed. 2.5cm x 1.2cm (Active)  06/27/22 0130  Location: Heel  Location Orientation: Left  Staging: Unstageable - Full thickness tissue loss in which the base of the injury is covered by slough (yellow, tan, gray, green or brown) and/or eschar (tan, brown or black) in the wound bed.  Wound Description (Comments): 2.5cm x 1.2cm  Present on Admission: Yes     Pressure Injury 06/27/22 Coccyx Right;Left Stage 1 -  Intact skin with non-blanchable redness of a localized area usually over a bony prominence. (Active)  06/27/22 0130  Location: Coccyx  Location Orientation: Right;Left  Staging: Stage 1 -  Intact skin with non-blanchable redness of a localized area usually over a bony prominence.  Wound Description (Comments):   Present on Admission: Yes    Per wound care.   Patient condition has been stable, pending nursing home placement.  Patient has insurance approval for transfer, nursing home cann take him on Monday.     Subjective:  Patient doing well, no new complaints.  Physical Exam: Vitals:   07/01/22 1542 07/01/22 2115 07/02/22 0542 07/02/22 0926  BP: (!) 115/52 134/65 136/69 122/69  Pulse: (!) 59 (!) 53 (!) 51 (!) 54  Resp: 18 16 18 18   Temp: 98.7 F (37.1 C) 97.8 F (36.6 C) (!) 97.5 F (36.4 C) 97.6 F (36.4 C)  TempSrc:      SpO2: 100% 100% 99% (!) 88%  Weight:      Height:       General  exam: Appears calm and comfortable  Respiratory system: Clear to auscultation. Respiratory effort normal. Cardiovascular system: S1 & S2 heard, RRR. No JVD, murmurs, rubs, gallops or clicks. No pedal edema. Gastrointestinal system: Abdomen is nondistended, soft and nontender. No organomegaly or masses felt. Normal bowel sounds heard. Central nervous system: Alert and oriented. No focal neurological deficits. Extremities:  Symmetric 5 x 5 power. Skin: No rashes, lesions or ulcers Psychiatry: Judgement and insight appear normal. Mood & affect appropriate.   Data Reviewed:  There are no new results to review at this time.  Family Communication: None  Disposition: Status is: Inpatient Remains inpatient appropriate because: Unsafe discharge.  Planned Discharge Destination: Skilled nursing facility    Time spent: 25 minutes  Author: Sharen Hones, MD 07/02/2022 12:00 PM  For on call review [email or web address].

## 2022-07-03 DIAGNOSIS — I951 Orthostatic hypotension: Secondary | ICD-10-CM | POA: Diagnosis not present

## 2022-07-03 DIAGNOSIS — L89622 Pressure ulcer of left heel, stage 2: Secondary | ICD-10-CM | POA: Diagnosis not present

## 2022-07-03 DIAGNOSIS — L89112 Pressure ulcer of right upper back, stage 2: Secondary | ICD-10-CM | POA: Diagnosis not present

## 2022-07-03 DIAGNOSIS — N1832 Chronic kidney disease, stage 3b: Secondary | ICD-10-CM | POA: Diagnosis not present

## 2022-07-03 DIAGNOSIS — I110 Hypertensive heart disease with heart failure: Secondary | ICD-10-CM | POA: Diagnosis not present

## 2022-07-03 DIAGNOSIS — L89620 Pressure ulcer of left heel, unstageable: Secondary | ICD-10-CM

## 2022-07-03 DIAGNOSIS — Z7982 Long term (current) use of aspirin: Secondary | ICD-10-CM | POA: Diagnosis not present

## 2022-07-03 DIAGNOSIS — I5033 Acute on chronic diastolic (congestive) heart failure: Secondary | ICD-10-CM | POA: Diagnosis not present

## 2022-07-03 DIAGNOSIS — L89151 Pressure ulcer of sacral region, stage 1: Secondary | ICD-10-CM | POA: Diagnosis not present

## 2022-07-03 DIAGNOSIS — I5032 Chronic diastolic (congestive) heart failure: Secondary | ICD-10-CM | POA: Diagnosis not present

## 2022-07-03 DIAGNOSIS — E871 Hypo-osmolality and hyponatremia: Secondary | ICD-10-CM | POA: Diagnosis not present

## 2022-07-03 DIAGNOSIS — Z6828 Body mass index (BMI) 28.0-28.9, adult: Secondary | ICD-10-CM | POA: Diagnosis not present

## 2022-07-03 DIAGNOSIS — I10 Essential (primary) hypertension: Secondary | ICD-10-CM | POA: Diagnosis not present

## 2022-07-03 DIAGNOSIS — N400 Enlarged prostate without lower urinary tract symptoms: Secondary | ICD-10-CM | POA: Diagnosis not present

## 2022-07-03 DIAGNOSIS — R197 Diarrhea, unspecified: Secondary | ICD-10-CM | POA: Diagnosis not present

## 2022-07-03 DIAGNOSIS — M109 Gout, unspecified: Secondary | ICD-10-CM | POA: Diagnosis not present

## 2022-07-03 DIAGNOSIS — I959 Hypotension, unspecified: Secondary | ICD-10-CM | POA: Diagnosis not present

## 2022-07-03 DIAGNOSIS — D638 Anemia in other chronic diseases classified elsewhere: Secondary | ICD-10-CM | POA: Diagnosis not present

## 2022-07-03 DIAGNOSIS — I130 Hypertensive heart and chronic kidney disease with heart failure and stage 1 through stage 4 chronic kidney disease, or unspecified chronic kidney disease: Secondary | ICD-10-CM | POA: Diagnosis not present

## 2022-07-03 DIAGNOSIS — I2510 Atherosclerotic heart disease of native coronary artery without angina pectoris: Secondary | ICD-10-CM | POA: Diagnosis not present

## 2022-07-03 DIAGNOSIS — Z96651 Presence of right artificial knee joint: Secondary | ICD-10-CM | POA: Diagnosis not present

## 2022-07-03 DIAGNOSIS — M1991 Primary osteoarthritis, unspecified site: Secondary | ICD-10-CM | POA: Diagnosis not present

## 2022-07-03 DIAGNOSIS — Z96641 Presence of right artificial hip joint: Secondary | ICD-10-CM | POA: Diagnosis not present

## 2022-07-03 DIAGNOSIS — E538 Deficiency of other specified B group vitamins: Secondary | ICD-10-CM | POA: Diagnosis not present

## 2022-07-03 DIAGNOSIS — D508 Other iron deficiency anemias: Secondary | ICD-10-CM | POA: Diagnosis not present

## 2022-07-03 DIAGNOSIS — N401 Enlarged prostate with lower urinary tract symptoms: Secondary | ICD-10-CM | POA: Diagnosis not present

## 2022-07-03 DIAGNOSIS — R55 Syncope and collapse: Secondary | ICD-10-CM | POA: Diagnosis not present

## 2022-07-03 LAB — GLUCOSE, CAPILLARY: Glucose-Capillary: 81 mg/dL (ref 70–99)

## 2022-07-03 NOTE — Progress Notes (Signed)
Report called to [facility], all questions answered.  AVS packet ready for EMS tranport

## 2022-07-03 NOTE — TOC Transition Note (Signed)
Transition of Care [facility]) - CM/SW Discharge Note   Patient Details  Name: Mayer Vondrak. MRN: [id number] Date of Birth: 1934-11-15  [facility] CM/SW Contact:  Gerilyn Pilgrim, LCSW Phone Number: 07/03/2022, 12:07 PM   Clinical Narrative:   [facility] arranged for patient. EMS [phone]. Pt is second on the list. Radiation protection practitioner notified, RN given number for report. Medical necessity forms printed to the unit. DC summary sent to facility.  CSW signing off.     Final next level of care: Skilled Nursing Facility Barriers to Discharge: Barriers Resolved   Patient Goals and CMS Choice CMS Medicare.gov Compare Post Acute Care list provided to:: Patient Choice offered to / list presented to : Patient  Discharge Placement                Patient chooses bed at: [facility] Patient to be transferred to facility by: [facility]      Discharge Plan and Services Additional resources added to the After Visit Summary for       Post Acute Care Choice: Home Health                               Social Determinants of Health (SDOH) Interventions SDOH Screenings   Food Insecurity: No Food Insecurity (06/27/2022)  Housing: Low Risk  (06/27/2022)  Transportation Needs: No Transportation Needs (06/27/2022)  Utilities: Not At Risk (06/27/2022)  Tobacco Use: Low Risk  (06/27/2022)     Readmission Risk Interventions     No data to display

## 2022-07-03 NOTE — Care Management Important Message (Signed)
Important Message  Patient Details  Name: Jerome [name]. MRN: [id number] Date of Birth: 1934/07/10   Medicare Important Message Given:  Yes     Juliann Pulse A Charissa Knowles 07/03/2022, 10:42 AM

## 2022-07-03 NOTE — Discharge Summary (Signed)
Physician Discharge Summary   Patient: Jerome EssexGordon Craig Rieves Jr. MRN: [id number] DOB: April 07, 1935  Admit date:     06/26/2022  Discharge date: 07/03/22  Discharge Physician: Marrion Coyekui Doratha Mcswain   PCP: Kandyce RudBabaoff, Marcus, MD   Recommendations at discharge:   Follow-up with PCP in 1 week.  Discharge Diagnoses: Principal Problem:   Syncope Active Problems:   CAD (coronary artery disease)   HTN (hypertension)   Hx of total hip arthroplasty, right   Hyponatremia   Pressure injury of skin   Overweight   Syncope due to orthostatic hypotension   Pressure ulcer of left heel, unstageable (HCC)   Chronic diastolic CHF (congestive heart failure) (HCC)   Anemia of chronic disease   Iron deficiency anemia  Resolved Problems:   Stage 3b chronic kidney disease (CKD) [facility](HCC)  Hospital Course: [name] is a 88 year old male with history of hypertension, BPH, who presents to the emergency department for chief concerns of syncope. Patient appears to be orthostatic and dizzy when standing.  Received IV fluids.  Patient also has slightly worsening renal function, was placed on IV fluids. Patient condition has been improved, currently pending nursing placement  Assessment and Plan:  Syncope with orthostatic hypotension UTI, ruled out - likely due to orthostasis, as BP was low on presentation, and pt felt dizzy upon standing.   - CT head without contrast and cervical spine was read as no acute intracranial findings  and no fractures seen in the cervical spine, respectively Did not have any dysuria, no evidence of UTI. No longer feels dizzy, condition stable.   Chronic kidney disease stage IIIb. Hyponatremia. Acute kidney injury ruled out. Did not meet criteria for acute kidney injury.  But his renal function is slightly worse than baseline. Patient received fluids, renal function close to baseline.  Sodium level still low, but stable.   Iron deficient anemia. Started oral iron treatment, B12  level elevated.  Hemoglobin stable.   Chronic diastolic congestive heart failure, No longer has a chronic systolic congestive heart failure. Echocardiogram performed on 2020 showed ejection fraction 45 to 50%.  Repeat echocardiogram showed ejection fraction 60 to 65% with severe concentric left ventricular hypertrophy. Patient currently does not have volume overload.   HTN (hypertension) Blood pressure medicine on hold.  Adjust medication at the time of discharge.   CAD (coronary artery disease) cont ASA   Overweight. BMI 28.4   Pressure ulcers POA Pressure Injury 06/27/22 Heel Left Unstageable - Full thickness tissue loss in which the base of the injury is covered by slough (yellow, tan, gray, green or brown) and/or eschar (tan, brown or black) in the wound bed. 2.5cm x 1.2cm (Active)  06/27/22 0130  Location: Heel  Location Orientation: Left  Staging: Unstageable - Full thickness tissue loss in which the base of the injury is covered by slough (yellow, tan, gray, green or brown) and/or eschar (tan, brown or black) in the wound bed.  Wound Description (Comments): 2.5cm x 1.2cm  Present on Admission: Yes     Pressure Injury 06/27/22 Coccyx Right;Left Stage 1 -  Intact skin with non-blanchable redness of a localized area usually over a bony prominence. (Active)  06/27/22 0130  Location: Coccyx  Location Orientation: Right;Left  Staging: Stage 1 -  Intact skin with non-blanchable redness of a localized area usually over a bony prominence.  Wound Description (Comments):   Present on Admission: Yes    Wound care order placed.       Consultants: None Procedures performed: None  Disposition: Skilled nursing facility Diet recommendation:  Discharge Diet Orders (From admission, onward)     Start     Ordered   07/03/22 0000  Diet - low sodium heart healthy        07/03/22 1011           Cardiac diet DISCHARGE MEDICATION: Allergies as of 07/03/2022       Reactions    Gabapentin Other (See Comments)   hypotension        Medication List     STOP taking these medications    celecoxib 200 MG capsule Commonly known as: CELEBREX   enoxaparin 40 MG/0.4ML injection Commonly known as: LOVENOX   losartan-hydrochlorothiazide 50-12.5 MG tablet Commonly known as: HYZAAR   oxyCODONE 5 MG immediate release tablet Commonly known as: Oxy IR/ROXICODONE   traMADol 50 MG tablet Commonly known as: ULTRAM       TAKE these medications    amLODipine 5 MG tablet Commonly known as: NORVASC Take 5 mg by mouth daily.   aspirin EC 81 MG tablet Take 81 mg by mouth daily.   carvedilol 6.25 MG tablet Commonly known as: COREG Take 1 tablet (6.25 mg total) by mouth 2 (two) times daily with a meal.   cyanocobalamin 1000 MCG tablet Commonly known as: VITAMIN B12 Take 1,000 mcg by mouth daily.   iron polysaccharides 150 MG capsule Commonly known as: NIFEREX Take 1 capsule (150 mg total) by mouth daily.   multivitamin with minerals tablet Take 1 tablet by mouth daily.   tamsulosin 0.4 MG Caps capsule Commonly known as: FLOMAX Take 0.4 mg by mouth daily at 6 PM.   vitamin C with rose hips 500 MG tablet Take 500 mg by mouth in the morning and at bedtime.               Discharge Care Instructions  (From admission, onward)           Start     Ordered   07/03/22 0000  Discharge wound care:       Comments: Follow with RN Coccyx Right;Left Stage 1 -  Intact skin with non-blanchable redness of a localized area usually over a bony prominence. 6 days   Pressure Injury 06/27/22 Heel Left Unstageable - Full thickness tissue loss in which the base of the injury is covered by slough (yellow, tan, gray, green or brown) and/or eschar (tan, brown or black) in the wound bed. 2.5cm x 1.2cm  Wound care to left heel pressure injury (Unstageable, POA): Cleanse with NS, pat dry. Paint with betadine swabstick and allow to air dry. When dry, cover with dry  gauze and secure with silicone foam. Place foot into Prevalon boot.   07/03/22 1011            Follow-up Information     Derinda Late, MD Follow up in 1 week(s).   Specialty: Family Medicine Contact information: [address] [facility] [place] [address] [phone]                Discharge Exam: Filed Weights   06/27/22 0300 06/28/22 0500 07/03/22 0500  Weight: 85.1 kg 87.3 kg 88 kg   General exam: Appears calm and comfortable  Respiratory system: Clear to auscultation. Respiratory effort normal. Cardiovascular system: S1 & S2 heard, RRR. No JVD, murmurs, rubs, gallops or clicks. No pedal edema. Gastrointestinal system: Abdomen is nondistended, soft and nontender. No organomegaly or masses felt. Normal bowel  sounds heard. Central nervous system: Alert and oriented. No focal neurological deficits. Extremities: Symmetric 5 x 5 power. Skin: No rashes, lesions or ulcers Psychiatry: Judgement and insight appear normal. Mood & affect appropriate.    Condition at discharge: good  The results of significant diagnostics from this hospitalization (including imaging, microbiology, ancillary and laboratory) are listed below for reference.   Imaging Studies: ECHOCARDIOGRAM COMPLETE  Result Date: 06/29/2022    ECHOCARDIOGRAM REPORT   Patient Name:   Jerome EssexGordon Craig Virrueta Jr. Date of Exam: 06/28/2022 Medical Rec #:  [id number]               Height:       69.0 in Accession #:    [id number]              Weight:       192.5 lb Date of Birth:  [birth year]               BSA:          2.033 m Patient Age:    87 years                BP:           148/63 mmHg Patient Gender: M                       HR:           53 bpm. Exam Location:  [facility] Procedure: 2D Echo, Cardiac Doppler and Color Doppler Indications:     Syncope R55  History:         Patient has prior history of Echocardiogram examinations, most                  recent 12/09/2018. CAD; Risk  Factors:Hypertension. Chronic                  systolic CHF.  Sonographer:     Cristela BlueJerry Hege Referring Phys:  [id number] Marrion CoyEKUI Vivian Neuwirth Diagnosing Phys: Adrian BlackwaterShaukat Khan  Sonographer Comments: Technically challenging study due to limited acoustic windows, no apical window and suboptimal subcostal window. IMPRESSIONS  1. Left ventricular ejection fraction, by estimation, is 60 to 65%. The left ventricle has normal function. The left ventricle has no regional wall motion abnormalities. There is severe concentric left ventricular hypertrophy. Left ventricular diastolic  function could not be evaluated.  2. Right ventricular systolic function is normal. The right ventricular size is normal.  3. Left atrial size was severely dilated.  4. Right atrial size was moderately dilated.  5. The mitral valve is normal in structure. Mild mitral valve regurgitation. No evidence of mitral stenosis.  6. The aortic valve is normal in structure. Aortic valve regurgitation is not visualized. No aortic stenosis is present.  7. The inferior vena cava is normal in size with greater than 50% respiratory variability, suggesting right atrial pressure of 3 mmHg. FINDINGS  Left Ventricle: Left ventricular ejection fraction, by estimation, is 60 to 65%. The left ventricle has normal function. The left ventricle has no regional wall motion abnormalities. The left ventricular internal cavity size was normal in size. There is  severe concentric left ventricular hypertrophy. Left ventricular diastolic function could not be evaluated. Right Ventricle: The right ventricular size is normal. No increase in right ventricular wall thickness. Right ventricular systolic function is normal. Left Atrium: Left atrial size was severely dilated. Right Atrium: Right atrial size was moderately dilated. Pericardium: There is no evidence of pericardial  effusion. Mitral Valve: The mitral valve is normal in structure. Mild mitral valve regurgitation. No evidence of mitral valve  stenosis. Tricuspid Valve: The tricuspid valve is normal in structure. Tricuspid valve regurgitation is mild . No evidence of tricuspid stenosis. Aortic Valve: The aortic valve is normal in structure. Aortic valve regurgitation is not visualized. No aortic stenosis is present. Pulmonic Valve: The pulmonic valve was normal in structure. Pulmonic valve regurgitation is trivial. No evidence of pulmonic stenosis. Aorta: The aortic root is normal in size and structure. Venous: The inferior vena cava is normal in size with greater than 50% respiratory variability, suggesting right atrial pressure of 3 mmHg. IAS/Shunts: No atrial level shunt detected by color flow Doppler.  LEFT VENTRICLE PLAX 2D LVIDd:         4.20 cm LVIDs:         2.90 cm LV PW:         1.50 cm LV IVS:        1.70 cm LVOT diam:     2.00 cm LVOT Area:     3.14 cm  LEFT ATRIUM         Index LA diam:    4.90 cm 2.41 cm/m   AORTA Ao Root diam: 3.77 cm  SHUNTS Systemic Diam: 2.00 cm Neoma Laming Electronically signed by Neoma Laming Signature Date/Time: 06/29/2022/9:44:52 AM    Final    DG Ankle Complete Left  Result Date: 06/28/2022 CLINICAL DATA:  Left ankle pain status post fall EXAM: LEFT ANKLE COMPLETE - 3 VIEW COMPARISON:  None Available. FINDINGS: There are no findings of fracture or dislocation. No joint effusion. Plantar calcaneal spur. Ankle mortise is intact. Soft tissues are unremarkable. IMPRESSION: No acute fracture or dislocation. Electronically Signed   By: Darrin Nipper M.D.   On: 06/28/2022 10:24   DG Ribs Unilateral Left  Result Date: 06/27/2022 CLINICAL DATA:  [id number] with syncopal episode and left upper ribcage pain. EXAM: LEFT RIBS - 2 VIEW COMPARISON:  Portable chest 06/26/2022, 03/20/2019. FINDINGS: There are healed fracture deformities of the anterior left sixth through ninth ribs. There could be an acute on chronic fracture of the anterior left ninth rib end on the last image, but if this is a recent fracture it appears  nondisplaced. The bone mineralization is normal and no other left rib fractures are seen. There is advanced left glenohumeral joint space loss with osteophytosis and remodeling, and acromiohumeral abutment consistent with chronic rotator cuff arthropathy, with osteophytosis at the [facility] joint. IMPRESSION: 1. Old healed fracture deformities of the anterior left sixth through ninth ribs. 2. Possible acute on chronic nondisplaced fracture of the anterior left ninth rib end, but only suggested on the last image. 3. No other left rib fractures are seen. Electronically Signed   By: Telford Nab M.D.   On: 06/27/2022 01:48   DG Chest Port 1 View  Result Date: 06/26/2022 CLINICAL DATA:  Syncope and fall EXAM: PORTABLE CHEST 1 VIEW COMPARISON:  03/20/2019 FINDINGS: Cardiomegaly. Retrocardiac airspace opacities. Additional airspace opacities in the right lower lung medially. No definite pleural effusion. No pneumothorax. Displaced rib fractures. IMPRESSION: Bibasilar airspace opacities suspicious for pneumonia. Electronically Signed   By: Placido Sou M.D.   On: 06/26/2022 19:37   CT Cervical Spine Wo Contrast  Result Date: 06/26/2022 CLINICAL DATA:  Trauma, fall EXAM: CT CERVICAL SPINE WITHOUT CONTRAST TECHNIQUE: Multidetector CT imaging of the cervical spine was performed without intravenous contrast. Multiplanar CT image reconstructions were also generated. RADIATION DOSE  REDUCTION: This exam was performed according to the departmental dose-optimization program which includes automated exposure control, adjustment of the mA and/or kV according to patient size and/or use of iterative reconstruction technique. COMPARISON:  None Available. FINDINGS: Alignment: Alignment of posterior margins of vertebral bodies is within normal limits. Skull base and vertebrae: No recent fracture is seen. Degenerative changes are noted. Soft tissues and spinal canal: There is extrinsic pressure over the ventral margin of thecal sac  caused by posterior bony spurs from C4-C7 levels. There is possible mild spinal stenosis. Disc levels: There is encroachment of neural foramina from C3-C7 levels. Upper chest: There are few blebs and bullae. There is possible ectasia of bronchi in right upper lung field. Other: There are few tiny pockets of air in some of the veins in right supraclavicular region, possibly introduced during venipuncture. IMPRESSION: No recent fracture is seen in cervical spine. Cervical spondylosis with spinal stenosis and encroachment of neural foramina at multiple levels. Electronically Signed   By: Ernie Avena M.D.   On: 06/26/2022 16:27   CT Head Wo Contrast  Result Date: 06/26/2022 CLINICAL DATA:  Trauma, fall, near syncope EXAM: CT HEAD WITHOUT CONTRAST TECHNIQUE: Contiguous axial images were obtained from the base of the skull through the vertex without intravenous contrast. RADIATION DOSE REDUCTION: This exam was performed according to the departmental dose-optimization program which includes automated exposure control, adjustment of the mA and/or kV according to patient size and/or use of iterative reconstruction technique. COMPARISON:  03/20/2019 FINDINGS: Brain: No acute intracranial findings are seen. There are no signs of bleeding within the cranium. Cortical sulci are prominent. There is decreased density in periventricular white matter. Ventricles are not dilated. Vascular: Scattered arterial calcifications are seen. Skull: Motion artifacts limit evaluation of base of the skull. There is no definite fracture in calvarium. Sinuses/Orbits: Motion artifacts limit evaluation. No significant abnormalities are noted. Other: None. IMPRESSION: Motion limited study. No acute intracranial findings are seen. Atrophy. Small-vessel disease. Electronically Signed   By: Ernie Avena M.D.   On: 06/26/2022 16:21    Microbiology: Results for orders placed or performed during the hospital encounter of 06/26/22   Urine Culture     Status: Abnormal   Collection Time: 06/26/22  5:23 PM   Specimen: Urine, Clean Catch  Result Value Ref Range Status   Specimen Description   Final    URINE, CLEAN CATCH Performed at [facility], [address], [place], [place] [address]    Special Requests   Final    NONE Performed at [facility], [address] [place], [place] [address]    Culture MULTIPLE SPECIES PRESENT, SUGGEST RECOLLECTION (A)  Final   Report Status 06/28/2022 FINAL  Final  Resp panel by RT-PCR (RSV, Flu A&B, Covid) Anterior Nasal Swab     Status: None   Collection Time: 06/26/22 11:44 PM   Specimen: Anterior Nasal Swab  Result Value Ref Range Status   SARS Coronavirus 2 by RT PCR NEGATIVE NEGATIVE Final    Comment: (NOTE) SARS-CoV-2 target nucleic acids are NOT DETECTED.  The SARS-CoV-2 RNA is generally detectable in upper respiratory specimens during the acute phase of infection. The lowest concentration of SARS-CoV-2 viral copies this assay can detect is 138 copies/mL. A negative result does not preclude SARS-Cov-2 infection and should not be used as the sole basis for treatment or other patient management decisions. A negative result may occur with  improper specimen collection/handling, submission of specimen other than nasopharyngeal swab, presence  of viral mutation(s) within the areas targeted by this assay, and inadequate number of viral copies(<138 copies/mL). A negative result must be combined with clinical observations, patient history, and epidemiological information. The expected result is Negative.  Fact Sheet for Patients:  BloggerCourse.com  Fact Sheet for Healthcare Providers:  SeriousBroker.it  This test is no t yet approved or cleared by the [place] FDA and  has been authorized for detection and/or diagnosis of SARS-CoV-2 by FDA under an Emergency Use Authorization (EUA). This EUA will  remain  in effect (meaning this test can be used) for the duration of the COVID-19 declaration under Section 564(b)(1) of the Act, 21 U.S.C.section 360bbb-3(b)(1), unless the authorization is terminated  or revoked sooner.       Influenza A by PCR NEGATIVE NEGATIVE Final   Influenza B by PCR NEGATIVE NEGATIVE Final    Comment: (NOTE) The Xpert Xpress SARS-CoV-2/FLU/RSV plus assay is intended as an aid in the diagnosis of influenza from Nasopharyngeal swab specimens and should not be used as a sole basis for treatment. Nasal washings and aspirates are unacceptable for Xpert Xpress SARS-CoV-2/FLU/RSV testing.  Fact Sheet for Patients: BloggerCourse.com  Fact Sheet for Healthcare Providers: SeriousBroker.it  This test is not yet approved or cleared by the [place] FDA and has been authorized for detection and/or diagnosis of SARS-CoV-2 by FDA under an Emergency Use Authorization (EUA). This EUA will remain in effect (meaning this test can be used) for the duration of the COVID-19 declaration under Section 564(b)(1) of the Act, 21 U.S.C. section 360bbb-3(b)(1), unless the authorization is terminated or revoked.     Resp Syncytial Virus by PCR NEGATIVE NEGATIVE Final    Comment: (NOTE) Fact Sheet for Patients: BloggerCourse.com  Fact Sheet for Healthcare Providers: SeriousBroker.it  This test is not yet approved or cleared by the [place] FDA and has been authorized for detection and/or diagnosis of SARS-CoV-2 by FDA under an Emergency Use Authorization (EUA). This EUA will remain in effect (meaning this test can be used) for the duration of the COVID-19 declaration under Section 564(b)(1) of the Act, 21 U.S.C. section 360bbb-3(b)(1), unless the authorization is terminated or revoked.  Performed at [facility], [address] [place], [place] [address]    Culture, blood (Routine X 2) w Reflex to ID Panel     Status: None   Collection Time: 06/26/22 11:45 PM   Specimen: BLOOD RIGHT ARM  Result Value Ref Range Status   Specimen Description BLOOD RIGHT ARM  Final   Special Requests   Final    BOTTLES DRAWN AEROBIC AND ANAEROBIC Blood Culture adequate volume   Culture   Final    NO GROWTH 5 DAYS Performed at [facility], [address], [place], [place] [address]    Report Status 07/02/2022 FINAL  Final  Culture, blood (Routine X 2) w Reflex to ID Panel     Status: None   Collection Time: 06/26/22 11:46 PM   Specimen: BLOOD RIGHT HAND  Result Value Ref Range Status   Specimen Description BLOOD RIGHT HAND  Final   Special Requests   Final    BOTTLES DRAWN AEROBIC AND ANAEROBIC Blood Culture adequate volume   Culture   Final    NO GROWTH 5 DAYS Performed at [facility], [address], [place], [place] [address]    Report Status 07/02/2022 FINAL  Final    Labs: CBC: Recent Labs  Lab 06/26/22 1516 06/27/22 0535 06/28/22 0442 06/29/22 0557 06/30/22  9628 07/01/22 0503 07/02/22 0427  WBC 9.3   < > 8.4 7.7 7.5 8.3 7.4  NEUTROABS 7.2  --   --   --   --   --   --   HGB 9.5*   < > 9.1* 8.6* 8.5* 8.6* 8.7*  HCT 30.7*   < > 28.5* 27.5* 26.8* 27.2* 27.8*  MCV 101.7*   < > 99.3 100.4* 100.8* 99.3 100.4*  PLT 223   < > 211 189 189 201 202   < > = values in this interval not displayed.   Basic Metabolic Panel: Recent Labs  Lab 06/26/22 1723 06/27/22 0535 06/28/22 0442 06/29/22 0557 06/30/22 0637 07/01/22 0503 07/02/22 0427  NA  --    < > 132* 131* 131* 132* 131*  K  --    < > 4.2 4.1 4.3 4.4 4.3  CL  --    < > 102 102 102 102 101  CO2  --    < > 25 22 23 23 22   GLUCOSE  --    < > 90 116* 87 86 83  BUN  --    < > 40* 38* 38* 39* 38*  CREATININE  --    < > 1.99* 1.77* 1.70* 1.71* 1.73*  CALCIUM  --    < > 8.2* 8.1* 8.2* 8.5* 8.5*  MG 2.3  --  2.1 2.0 2.1 2.2 2.2  PHOS 3.9  --   --   --   --   --   --     < > = values in this interval not displayed.   Liver Function Tests: No results for input(s): "AST", "ALT", "ALKPHOS", "BILITOT", "PROT", "ALBUMIN" in the last 168 hours. CBG: Recent Labs  Lab 06/30/22 0524 07/01/22 0641 07/02/22 0540 07/02/22 2151 07/03/22 0448  GLUCAP 90 95 88 109* 81    Discharge time spent: greater than 30 minutes.  Signed: 07/05/22, MD [facility] 07/03/2022

## 2022-07-03 NOTE — Progress Notes (Signed)
Physical Therapy Treatment Patient Details Name: Jerome [name]. MRN: [id number] DOB: [birth year] Today's Date: 07/03/2022   History of Present Illness Pt is an 88 y/o M admitted on 06/26/22 after presenting with chief concerns of syncope. Pt's BP was low upon presentation. PMH: HTN, BPH    PT Comments    Pt seen for PT tx with pt agreeable to OOB mobility to chair to eat breakfast. Pt is able to complete supine>sit with HOB elevated, min assist to move LLE a bit, and extra time to upright trunk. Pt is able to complete STS & step pivot with RW & supervision with extra time PRN. Pt continues to endorse LLE pain & L rib pain, especially with breathing. Pt requires max assist for meal tray set up & left in recliner with all needs at hand. Continue to recommend STR upon d/c.    Recommendations for follow up therapy are one component of a multi-disciplinary discharge planning process, led by the attending physician.  Recommendations may be updated based on patient status, additional functional criteria and insurance authorization.  Follow Up Recommendations  Skilled nursing-short term rehab (<3 hours/day) Can patient physically be transported by private vehicle: Yes   Assistance Recommended at Discharge Frequent or constant Supervision/Assistance  Patient can return home with the following A little help with bathing/dressing/bathroom;A little help with walking and/or transfers;Assistance with cooking/housework;Assist for transportation;Help with stairs or ramp for entrance   Equipment Recommendations  None recommended by PT    Recommendations for Other Services       Precautions / Restrictions Precautions Precautions: Fall Restrictions Weight Bearing Restrictions: No     Mobility  Bed Mobility Overal bed mobility: Needs Assistance Bed Mobility: Supine to Sit     Supine to sit: HOB elevated, Min assist     General bed mobility comments: extra time to complete movement with  bed rails, HOB elevated, assistance to move LLE off pillow    Transfers Overall transfer level: Needs assistance Equipment used: Rolling walker (2 wheels) Transfers: Sit to/from Stand Sit to Stand: Supervision   Step pivot transfers: Supervision       General transfer comment: STS from elevated EOB with RW & supervision, supervision for step pivot bed>recliner with RW & extra time to step & turn    Ambulation/Gait Ambulation/Gait assistance:  (not attempted 2/2 pt wishing to eat breakfast)                 Stairs             Wheelchair Mobility    Modified Rankin (Stroke Patients Only)       Balance Overall balance assessment: Needs assistance Sitting-balance support: Feet supported, Bilateral upper extremity supported Sitting balance-Leahy Scale: Good     Standing balance support: Bilateral upper extremity supported, During functional activity, Reliant on assistive device for balance Standing balance-Leahy Scale: Fair                              Cognition Arousal/Alertness: Awake/alert Behavior During Therapy: WFL for tasks assessed/performed Overall Cognitive Status: Within Functional Limits for tasks assessed                                          Exercises      General Comments General comments (skin integrity, edema, etc.): Pt requires total assist  for meal tray set up      Pertinent Vitals/Pain Pain Assessment Pain Assessment: Faces Faces Pain Scale: Hurts whole lot Pain Location: LLE hip, L heel with touch, and L rib Pain Descriptors / Indicators: Discomfort, Grimacing Pain Intervention(s): Monitored during session, Repositioned, Limited activity within patient's tolerance    Home Living                          Prior Function            PT Goals (current goals can now be found in the care plan section) Acute Rehab PT Goals Patient Stated Goal: get stronger PT Goal Formulation: With  patient Time For Goal Achievement: 07/12/22 Potential to Achieve Goals: Fair Progress towards PT goals: Progressing toward goals    Frequency    Min 2X/week      PT Plan Current plan remains appropriate    Co-evaluation              AM-PAC PT "6 Clicks" Mobility   Outcome Measure    Help needed moving from lying on your back to sitting on the side of a flat bed without using bedrails?: A Lot Help needed moving to and from a bed to a chair (including a wheelchair)?: A Little Help needed standing up from a chair using your arms (e.g., wheelchair or bedside chair)?: A Little Help needed to walk in hospital room?: A Little Help needed climbing 3-5 steps with a railing? : A Lot 6 Click Score: 13    End of Session   Activity Tolerance: Patient tolerated treatment well (limited by pt wishing to eat breakfast) Patient left: in chair;with call bell/phone within reach   PT Visit Diagnosis: Muscle weakness (generalized) (M62.81);Difficulty in walking, not elsewhere classified (R26.2) Pain - Right/Left: Left Pain - part of body: Leg (rib)     Time: 0962-8366 PT Time Calculation (min) (ACUTE ONLY): 10 min  Charges:  $Therapeutic Activity: 8-22 mins                     [name], PT, DPT 07/03/22, 9:15 AM   [name] 07/03/2022, 9:13 AM

## 2022-07-07 DIAGNOSIS — I110 Hypertensive heart disease with heart failure: Secondary | ICD-10-CM | POA: Diagnosis not present

## 2022-07-07 DIAGNOSIS — L89620 Pressure ulcer of left heel, unstageable: Secondary | ICD-10-CM | POA: Diagnosis not present

## 2022-07-07 DIAGNOSIS — R55 Syncope and collapse: Secondary | ICD-10-CM | POA: Diagnosis not present

## 2022-07-07 DIAGNOSIS — I5033 Acute on chronic diastolic (congestive) heart failure: Secondary | ICD-10-CM | POA: Diagnosis not present

## 2022-07-07 DIAGNOSIS — N1832 Chronic kidney disease, stage 3b: Secondary | ICD-10-CM | POA: Diagnosis not present

## 2022-07-07 DIAGNOSIS — L89151 Pressure ulcer of sacral region, stage 1: Secondary | ICD-10-CM | POA: Diagnosis not present

## 2022-07-07 DIAGNOSIS — I2510 Atherosclerotic heart disease of native coronary artery without angina pectoris: Secondary | ICD-10-CM | POA: Diagnosis not present

## 2022-07-07 DIAGNOSIS — Z6828 Body mass index (BMI) 28.0-28.9, adult: Secondary | ICD-10-CM | POA: Diagnosis not present

## 2022-07-07 DIAGNOSIS — I951 Orthostatic hypotension: Secondary | ICD-10-CM | POA: Diagnosis not present

## 2022-07-07 DIAGNOSIS — E871 Hypo-osmolality and hyponatremia: Secondary | ICD-10-CM | POA: Diagnosis not present

## 2022-07-07 DIAGNOSIS — N401 Enlarged prostate with lower urinary tract symptoms: Secondary | ICD-10-CM | POA: Diagnosis not present

## 2022-07-07 DIAGNOSIS — D508 Other iron deficiency anemias: Secondary | ICD-10-CM | POA: Diagnosis not present

## 2022-07-10 DIAGNOSIS — L89151 Pressure ulcer of sacral region, stage 1: Secondary | ICD-10-CM | POA: Diagnosis not present

## 2022-07-10 DIAGNOSIS — N401 Enlarged prostate with lower urinary tract symptoms: Secondary | ICD-10-CM | POA: Diagnosis not present

## 2022-07-10 DIAGNOSIS — I951 Orthostatic hypotension: Secondary | ICD-10-CM | POA: Diagnosis not present

## 2022-07-10 DIAGNOSIS — N1832 Chronic kidney disease, stage 3b: Secondary | ICD-10-CM | POA: Diagnosis not present

## 2022-07-10 DIAGNOSIS — L89620 Pressure ulcer of left heel, unstageable: Secondary | ICD-10-CM | POA: Diagnosis not present

## 2022-07-10 DIAGNOSIS — I5033 Acute on chronic diastolic (congestive) heart failure: Secondary | ICD-10-CM | POA: Diagnosis not present

## 2022-07-10 DIAGNOSIS — D508 Other iron deficiency anemias: Secondary | ICD-10-CM | POA: Diagnosis not present

## 2022-07-10 DIAGNOSIS — Z6828 Body mass index (BMI) 28.0-28.9, adult: Secondary | ICD-10-CM | POA: Diagnosis not present

## 2022-07-10 DIAGNOSIS — E871 Hypo-osmolality and hyponatremia: Secondary | ICD-10-CM | POA: Diagnosis not present

## 2022-07-10 DIAGNOSIS — R55 Syncope and collapse: Secondary | ICD-10-CM | POA: Diagnosis not present

## 2022-07-10 DIAGNOSIS — I110 Hypertensive heart disease with heart failure: Secondary | ICD-10-CM | POA: Diagnosis not present

## 2022-07-10 DIAGNOSIS — I2510 Atherosclerotic heart disease of native coronary artery without angina pectoris: Secondary | ICD-10-CM | POA: Diagnosis not present

## 2022-07-11 DIAGNOSIS — I110 Hypertensive heart disease with heart failure: Secondary | ICD-10-CM | POA: Diagnosis not present

## 2022-07-11 DIAGNOSIS — L89620 Pressure ulcer of left heel, unstageable: Secondary | ICD-10-CM | POA: Diagnosis not present

## 2022-07-11 DIAGNOSIS — D508 Other iron deficiency anemias: Secondary | ICD-10-CM | POA: Diagnosis not present

## 2022-07-11 DIAGNOSIS — L89151 Pressure ulcer of sacral region, stage 1: Secondary | ICD-10-CM | POA: Diagnosis not present

## 2022-07-11 DIAGNOSIS — I951 Orthostatic hypotension: Secondary | ICD-10-CM | POA: Diagnosis not present

## 2022-07-11 DIAGNOSIS — I2510 Atherosclerotic heart disease of native coronary artery without angina pectoris: Secondary | ICD-10-CM | POA: Diagnosis not present

## 2022-07-11 DIAGNOSIS — N1832 Chronic kidney disease, stage 3b: Secondary | ICD-10-CM | POA: Diagnosis not present

## 2022-07-11 DIAGNOSIS — R55 Syncope and collapse: Secondary | ICD-10-CM | POA: Diagnosis not present

## 2022-07-11 DIAGNOSIS — I5033 Acute on chronic diastolic (congestive) heart failure: Secondary | ICD-10-CM | POA: Diagnosis not present

## 2022-07-11 DIAGNOSIS — R197 Diarrhea, unspecified: Secondary | ICD-10-CM | POA: Diagnosis not present

## 2022-07-11 DIAGNOSIS — E871 Hypo-osmolality and hyponatremia: Secondary | ICD-10-CM | POA: Diagnosis not present

## 2022-07-11 DIAGNOSIS — N401 Enlarged prostate with lower urinary tract symptoms: Secondary | ICD-10-CM | POA: Diagnosis not present

## 2022-07-14 DIAGNOSIS — N1832 Chronic kidney disease, stage 3b: Secondary | ICD-10-CM | POA: Diagnosis not present

## 2022-07-14 DIAGNOSIS — D508 Other iron deficiency anemias: Secondary | ICD-10-CM | POA: Diagnosis not present

## 2022-07-14 DIAGNOSIS — Z6828 Body mass index (BMI) 28.0-28.9, adult: Secondary | ICD-10-CM | POA: Diagnosis not present

## 2022-07-14 DIAGNOSIS — N401 Enlarged prostate with lower urinary tract symptoms: Secondary | ICD-10-CM | POA: Diagnosis not present

## 2022-07-14 DIAGNOSIS — I5033 Acute on chronic diastolic (congestive) heart failure: Secondary | ICD-10-CM | POA: Diagnosis not present

## 2022-07-14 DIAGNOSIS — I2510 Atherosclerotic heart disease of native coronary artery without angina pectoris: Secondary | ICD-10-CM | POA: Diagnosis not present

## 2022-07-14 DIAGNOSIS — L89151 Pressure ulcer of sacral region, stage 1: Secondary | ICD-10-CM | POA: Diagnosis not present

## 2022-07-14 DIAGNOSIS — I951 Orthostatic hypotension: Secondary | ICD-10-CM | POA: Diagnosis not present

## 2022-07-14 DIAGNOSIS — E871 Hypo-osmolality and hyponatremia: Secondary | ICD-10-CM | POA: Diagnosis not present

## 2022-07-14 DIAGNOSIS — I110 Hypertensive heart disease with heart failure: Secondary | ICD-10-CM | POA: Diagnosis not present

## 2022-07-14 DIAGNOSIS — R55 Syncope and collapse: Secondary | ICD-10-CM | POA: Diagnosis not present

## 2022-07-14 DIAGNOSIS — L89620 Pressure ulcer of left heel, unstageable: Secondary | ICD-10-CM | POA: Diagnosis not present

## 2022-07-18 DIAGNOSIS — I5033 Acute on chronic diastolic (congestive) heart failure: Secondary | ICD-10-CM | POA: Diagnosis not present

## 2022-07-18 DIAGNOSIS — I110 Hypertensive heart disease with heart failure: Secondary | ICD-10-CM | POA: Diagnosis not present

## 2022-07-18 DIAGNOSIS — N401 Enlarged prostate with lower urinary tract symptoms: Secondary | ICD-10-CM | POA: Diagnosis not present

## 2022-07-18 DIAGNOSIS — I951 Orthostatic hypotension: Secondary | ICD-10-CM | POA: Diagnosis not present

## 2022-07-18 DIAGNOSIS — N1832 Chronic kidney disease, stage 3b: Secondary | ICD-10-CM | POA: Diagnosis not present

## 2022-07-18 DIAGNOSIS — R55 Syncope and collapse: Secondary | ICD-10-CM | POA: Diagnosis not present

## 2022-07-18 DIAGNOSIS — L89620 Pressure ulcer of left heel, unstageable: Secondary | ICD-10-CM | POA: Diagnosis not present

## 2022-07-18 DIAGNOSIS — I2510 Atherosclerotic heart disease of native coronary artery without angina pectoris: Secondary | ICD-10-CM | POA: Diagnosis not present

## 2022-07-18 DIAGNOSIS — E871 Hypo-osmolality and hyponatremia: Secondary | ICD-10-CM | POA: Diagnosis not present

## 2022-07-18 DIAGNOSIS — L89151 Pressure ulcer of sacral region, stage 1: Secondary | ICD-10-CM | POA: Diagnosis not present

## 2022-07-18 DIAGNOSIS — D508 Other iron deficiency anemias: Secondary | ICD-10-CM | POA: Diagnosis not present

## 2022-07-18 DIAGNOSIS — Z6828 Body mass index (BMI) 28.0-28.9, adult: Secondary | ICD-10-CM | POA: Diagnosis not present

## 2022-07-19 DIAGNOSIS — N1832 Chronic kidney disease, stage 3b: Secondary | ICD-10-CM | POA: Diagnosis not present

## 2022-07-19 DIAGNOSIS — L89112 Pressure ulcer of right upper back, stage 2: Secondary | ICD-10-CM | POA: Diagnosis not present

## 2022-07-19 DIAGNOSIS — D638 Anemia in other chronic diseases classified elsewhere: Secondary | ICD-10-CM | POA: Diagnosis not present

## 2022-07-19 DIAGNOSIS — I5032 Chronic diastolic (congestive) heart failure: Secondary | ICD-10-CM | POA: Diagnosis not present

## 2022-07-28 DIAGNOSIS — Z87440 Personal history of urinary (tract) infections: Secondary | ICD-10-CM | POA: Diagnosis not present

## 2022-07-28 DIAGNOSIS — Z9181 History of falling: Secondary | ICD-10-CM | POA: Diagnosis not present

## 2022-07-28 DIAGNOSIS — E871 Hypo-osmolality and hyponatremia: Secondary | ICD-10-CM | POA: Diagnosis not present

## 2022-07-28 DIAGNOSIS — D508 Other iron deficiency anemias: Secondary | ICD-10-CM | POA: Diagnosis not present

## 2022-07-28 DIAGNOSIS — I951 Orthostatic hypotension: Secondary | ICD-10-CM | POA: Diagnosis not present

## 2022-07-28 DIAGNOSIS — N1832 Chronic kidney disease, stage 3b: Secondary | ICD-10-CM | POA: Diagnosis not present

## 2022-07-28 DIAGNOSIS — F419 Anxiety disorder, unspecified: Secondary | ICD-10-CM | POA: Diagnosis not present

## 2022-07-28 DIAGNOSIS — E663 Overweight: Secondary | ICD-10-CM | POA: Diagnosis not present

## 2022-07-28 DIAGNOSIS — M1990 Unspecified osteoarthritis, unspecified site: Secondary | ICD-10-CM | POA: Diagnosis not present

## 2022-07-28 DIAGNOSIS — N400 Enlarged prostate without lower urinary tract symptoms: Secondary | ICD-10-CM | POA: Diagnosis not present

## 2022-07-28 DIAGNOSIS — F32A Depression, unspecified: Secondary | ICD-10-CM | POA: Diagnosis not present

## 2022-07-28 DIAGNOSIS — I5033 Acute on chronic diastolic (congestive) heart failure: Secondary | ICD-10-CM | POA: Diagnosis not present

## 2022-07-28 DIAGNOSIS — L89151 Pressure ulcer of sacral region, stage 1: Secondary | ICD-10-CM | POA: Diagnosis not present

## 2022-07-28 DIAGNOSIS — N179 Acute kidney failure, unspecified: Secondary | ICD-10-CM | POA: Diagnosis not present

## 2022-07-28 DIAGNOSIS — E039 Hypothyroidism, unspecified: Secondary | ICD-10-CM | POA: Diagnosis not present

## 2022-07-28 DIAGNOSIS — I2510 Atherosclerotic heart disease of native coronary artery without angina pectoris: Secondary | ICD-10-CM | POA: Diagnosis not present

## 2022-07-28 DIAGNOSIS — I130 Hypertensive heart and chronic kidney disease with heart failure and stage 1 through stage 4 chronic kidney disease, or unspecified chronic kidney disease: Secondary | ICD-10-CM | POA: Diagnosis not present

## 2022-07-28 DIAGNOSIS — Z6831 Body mass index (BMI) 31.0-31.9, adult: Secondary | ICD-10-CM | POA: Diagnosis not present

## 2022-08-03 DIAGNOSIS — R29898 Other symptoms and signs involving the musculoskeletal system: Secondary | ICD-10-CM | POA: Diagnosis not present

## 2022-08-03 DIAGNOSIS — I10 Essential (primary) hypertension: Secondary | ICD-10-CM | POA: Diagnosis not present

## 2022-08-03 DIAGNOSIS — I5022 Chronic systolic (congestive) heart failure: Secondary | ICD-10-CM | POA: Diagnosis not present

## 2022-08-03 DIAGNOSIS — N1832 Chronic kidney disease, stage 3b: Secondary | ICD-10-CM | POA: Diagnosis not present

## 2022-08-08 DIAGNOSIS — I2510 Atherosclerotic heart disease of native coronary artery without angina pectoris: Secondary | ICD-10-CM | POA: Diagnosis not present

## 2022-08-08 DIAGNOSIS — N400 Enlarged prostate without lower urinary tract symptoms: Secondary | ICD-10-CM | POA: Diagnosis not present

## 2022-08-08 DIAGNOSIS — Z87440 Personal history of urinary (tract) infections: Secondary | ICD-10-CM | POA: Diagnosis not present

## 2022-08-08 DIAGNOSIS — I951 Orthostatic hypotension: Secondary | ICD-10-CM | POA: Diagnosis not present

## 2022-08-08 DIAGNOSIS — N1832 Chronic kidney disease, stage 3b: Secondary | ICD-10-CM | POA: Diagnosis not present

## 2022-08-08 DIAGNOSIS — D508 Other iron deficiency anemias: Secondary | ICD-10-CM | POA: Diagnosis not present

## 2022-08-08 DIAGNOSIS — F32A Depression, unspecified: Secondary | ICD-10-CM | POA: Diagnosis not present

## 2022-08-08 DIAGNOSIS — I130 Hypertensive heart and chronic kidney disease with heart failure and stage 1 through stage 4 chronic kidney disease, or unspecified chronic kidney disease: Secondary | ICD-10-CM | POA: Diagnosis not present

## 2022-08-08 DIAGNOSIS — F419 Anxiety disorder, unspecified: Secondary | ICD-10-CM | POA: Diagnosis not present

## 2022-08-08 DIAGNOSIS — N179 Acute kidney failure, unspecified: Secondary | ICD-10-CM | POA: Diagnosis not present

## 2022-08-08 DIAGNOSIS — L89151 Pressure ulcer of sacral region, stage 1: Secondary | ICD-10-CM | POA: Diagnosis not present

## 2022-08-08 DIAGNOSIS — M1990 Unspecified osteoarthritis, unspecified site: Secondary | ICD-10-CM | POA: Diagnosis not present

## 2022-08-08 DIAGNOSIS — E663 Overweight: Secondary | ICD-10-CM | POA: Diagnosis not present

## 2022-08-08 DIAGNOSIS — E039 Hypothyroidism, unspecified: Secondary | ICD-10-CM | POA: Diagnosis not present

## 2022-08-08 DIAGNOSIS — Z9181 History of falling: Secondary | ICD-10-CM | POA: Diagnosis not present

## 2022-08-08 DIAGNOSIS — I5033 Acute on chronic diastolic (congestive) heart failure: Secondary | ICD-10-CM | POA: Diagnosis not present

## 2022-08-08 DIAGNOSIS — Z6831 Body mass index (BMI) 31.0-31.9, adult: Secondary | ICD-10-CM | POA: Diagnosis not present

## 2022-08-08 DIAGNOSIS — E871 Hypo-osmolality and hyponatremia: Secondary | ICD-10-CM | POA: Diagnosis not present

## 2022-08-22 DIAGNOSIS — F32A Depression, unspecified: Secondary | ICD-10-CM | POA: Diagnosis not present

## 2022-08-22 DIAGNOSIS — N179 Acute kidney failure, unspecified: Secondary | ICD-10-CM | POA: Diagnosis not present

## 2022-08-22 DIAGNOSIS — Z87440 Personal history of urinary (tract) infections: Secondary | ICD-10-CM | POA: Diagnosis not present

## 2022-08-22 DIAGNOSIS — Z9181 History of falling: Secondary | ICD-10-CM | POA: Diagnosis not present

## 2022-08-22 DIAGNOSIS — I2510 Atherosclerotic heart disease of native coronary artery without angina pectoris: Secondary | ICD-10-CM | POA: Diagnosis not present

## 2022-08-22 DIAGNOSIS — E663 Overweight: Secondary | ICD-10-CM | POA: Diagnosis not present

## 2022-08-22 DIAGNOSIS — I130 Hypertensive heart and chronic kidney disease with heart failure and stage 1 through stage 4 chronic kidney disease, or unspecified chronic kidney disease: Secondary | ICD-10-CM | POA: Diagnosis not present

## 2022-08-22 DIAGNOSIS — Z6831 Body mass index (BMI) 31.0-31.9, adult: Secondary | ICD-10-CM | POA: Diagnosis not present

## 2022-08-22 DIAGNOSIS — N1832 Chronic kidney disease, stage 3b: Secondary | ICD-10-CM | POA: Diagnosis not present

## 2022-08-22 DIAGNOSIS — I5033 Acute on chronic diastolic (congestive) heart failure: Secondary | ICD-10-CM | POA: Diagnosis not present

## 2022-08-22 DIAGNOSIS — D508 Other iron deficiency anemias: Secondary | ICD-10-CM | POA: Diagnosis not present

## 2022-08-22 DIAGNOSIS — E871 Hypo-osmolality and hyponatremia: Secondary | ICD-10-CM | POA: Diagnosis not present

## 2022-08-22 DIAGNOSIS — E039 Hypothyroidism, unspecified: Secondary | ICD-10-CM | POA: Diagnosis not present

## 2022-08-22 DIAGNOSIS — M1990 Unspecified osteoarthritis, unspecified site: Secondary | ICD-10-CM | POA: Diagnosis not present

## 2022-08-22 DIAGNOSIS — I951 Orthostatic hypotension: Secondary | ICD-10-CM | POA: Diagnosis not present

## 2022-08-22 DIAGNOSIS — N400 Enlarged prostate without lower urinary tract symptoms: Secondary | ICD-10-CM | POA: Diagnosis not present

## 2022-08-22 DIAGNOSIS — F419 Anxiety disorder, unspecified: Secondary | ICD-10-CM | POA: Diagnosis not present

## 2022-08-22 DIAGNOSIS — L89151 Pressure ulcer of sacral region, stage 1: Secondary | ICD-10-CM | POA: Diagnosis not present

## 2022-09-11 DIAGNOSIS — N1832 Chronic kidney disease, stage 3b: Secondary | ICD-10-CM | POA: Diagnosis not present

## 2022-09-11 DIAGNOSIS — Z9181 History of falling: Secondary | ICD-10-CM | POA: Diagnosis not present

## 2022-09-11 DIAGNOSIS — I130 Hypertensive heart and chronic kidney disease with heart failure and stage 1 through stage 4 chronic kidney disease, or unspecified chronic kidney disease: Secondary | ICD-10-CM | POA: Diagnosis not present

## 2022-09-11 DIAGNOSIS — E871 Hypo-osmolality and hyponatremia: Secondary | ICD-10-CM | POA: Diagnosis not present

## 2022-09-11 DIAGNOSIS — I951 Orthostatic hypotension: Secondary | ICD-10-CM | POA: Diagnosis not present

## 2022-09-11 DIAGNOSIS — M1990 Unspecified osteoarthritis, unspecified site: Secondary | ICD-10-CM | POA: Diagnosis not present

## 2022-09-11 DIAGNOSIS — F419 Anxiety disorder, unspecified: Secondary | ICD-10-CM | POA: Diagnosis not present

## 2022-09-11 DIAGNOSIS — I5033 Acute on chronic diastolic (congestive) heart failure: Secondary | ICD-10-CM | POA: Diagnosis not present

## 2022-09-11 DIAGNOSIS — D508 Other iron deficiency anemias: Secondary | ICD-10-CM | POA: Diagnosis not present

## 2022-09-11 DIAGNOSIS — N400 Enlarged prostate without lower urinary tract symptoms: Secondary | ICD-10-CM | POA: Diagnosis not present

## 2022-09-11 DIAGNOSIS — E663 Overweight: Secondary | ICD-10-CM | POA: Diagnosis not present

## 2022-09-11 DIAGNOSIS — N179 Acute kidney failure, unspecified: Secondary | ICD-10-CM | POA: Diagnosis not present

## 2022-09-11 DIAGNOSIS — E039 Hypothyroidism, unspecified: Secondary | ICD-10-CM | POA: Diagnosis not present

## 2022-09-11 DIAGNOSIS — Z6831 Body mass index (BMI) 31.0-31.9, adult: Secondary | ICD-10-CM | POA: Diagnosis not present

## 2022-09-11 DIAGNOSIS — L89151 Pressure ulcer of sacral region, stage 1: Secondary | ICD-10-CM | POA: Diagnosis not present

## 2022-09-11 DIAGNOSIS — F32A Depression, unspecified: Secondary | ICD-10-CM | POA: Diagnosis not present

## 2022-09-11 DIAGNOSIS — Z87440 Personal history of urinary (tract) infections: Secondary | ICD-10-CM | POA: Diagnosis not present

## 2022-09-11 DIAGNOSIS — I2510 Atherosclerotic heart disease of native coronary artery without angina pectoris: Secondary | ICD-10-CM | POA: Diagnosis not present

## 2022-09-21 DIAGNOSIS — F32A Depression, unspecified: Secondary | ICD-10-CM | POA: Diagnosis not present

## 2022-09-21 DIAGNOSIS — D508 Other iron deficiency anemias: Secondary | ICD-10-CM | POA: Diagnosis not present

## 2022-09-21 DIAGNOSIS — F419 Anxiety disorder, unspecified: Secondary | ICD-10-CM | POA: Diagnosis not present

## 2022-09-21 DIAGNOSIS — L89151 Pressure ulcer of sacral region, stage 1: Secondary | ICD-10-CM | POA: Diagnosis not present

## 2022-09-21 DIAGNOSIS — Z6831 Body mass index (BMI) 31.0-31.9, adult: Secondary | ICD-10-CM | POA: Diagnosis not present

## 2022-09-21 DIAGNOSIS — I5033 Acute on chronic diastolic (congestive) heart failure: Secondary | ICD-10-CM | POA: Diagnosis not present

## 2022-09-21 DIAGNOSIS — E871 Hypo-osmolality and hyponatremia: Secondary | ICD-10-CM | POA: Diagnosis not present

## 2022-09-21 DIAGNOSIS — I951 Orthostatic hypotension: Secondary | ICD-10-CM | POA: Diagnosis not present

## 2022-09-21 DIAGNOSIS — I2510 Atherosclerotic heart disease of native coronary artery without angina pectoris: Secondary | ICD-10-CM | POA: Diagnosis not present

## 2022-09-21 DIAGNOSIS — M1990 Unspecified osteoarthritis, unspecified site: Secondary | ICD-10-CM | POA: Diagnosis not present

## 2022-09-21 DIAGNOSIS — E039 Hypothyroidism, unspecified: Secondary | ICD-10-CM | POA: Diagnosis not present

## 2022-09-21 DIAGNOSIS — N179 Acute kidney failure, unspecified: Secondary | ICD-10-CM | POA: Diagnosis not present

## 2022-09-21 DIAGNOSIS — I130 Hypertensive heart and chronic kidney disease with heart failure and stage 1 through stage 4 chronic kidney disease, or unspecified chronic kidney disease: Secondary | ICD-10-CM | POA: Diagnosis not present

## 2022-09-21 DIAGNOSIS — N400 Enlarged prostate without lower urinary tract symptoms: Secondary | ICD-10-CM | POA: Diagnosis not present

## 2022-09-21 DIAGNOSIS — Z9181 History of falling: Secondary | ICD-10-CM | POA: Diagnosis not present

## 2022-09-21 DIAGNOSIS — N1832 Chronic kidney disease, stage 3b: Secondary | ICD-10-CM | POA: Diagnosis not present

## 2022-09-21 DIAGNOSIS — Z87440 Personal history of urinary (tract) infections: Secondary | ICD-10-CM | POA: Diagnosis not present

## 2022-09-21 DIAGNOSIS — E663 Overweight: Secondary | ICD-10-CM | POA: Diagnosis not present

## 2022-09-25 DIAGNOSIS — D508 Other iron deficiency anemias: Secondary | ICD-10-CM | POA: Diagnosis not present

## 2022-09-25 DIAGNOSIS — Z6831 Body mass index (BMI) 31.0-31.9, adult: Secondary | ICD-10-CM | POA: Diagnosis not present

## 2022-09-25 DIAGNOSIS — I5033 Acute on chronic diastolic (congestive) heart failure: Secondary | ICD-10-CM | POA: Diagnosis not present

## 2022-09-25 DIAGNOSIS — N1832 Chronic kidney disease, stage 3b: Secondary | ICD-10-CM | POA: Diagnosis not present

## 2022-09-25 DIAGNOSIS — E871 Hypo-osmolality and hyponatremia: Secondary | ICD-10-CM | POA: Diagnosis not present

## 2022-09-25 DIAGNOSIS — F32A Depression, unspecified: Secondary | ICD-10-CM | POA: Diagnosis not present

## 2022-09-25 DIAGNOSIS — F419 Anxiety disorder, unspecified: Secondary | ICD-10-CM | POA: Diagnosis not present

## 2022-09-25 DIAGNOSIS — M1990 Unspecified osteoarthritis, unspecified site: Secondary | ICD-10-CM | POA: Diagnosis not present

## 2022-09-25 DIAGNOSIS — I130 Hypertensive heart and chronic kidney disease with heart failure and stage 1 through stage 4 chronic kidney disease, or unspecified chronic kidney disease: Secondary | ICD-10-CM | POA: Diagnosis not present

## 2022-09-25 DIAGNOSIS — N179 Acute kidney failure, unspecified: Secondary | ICD-10-CM | POA: Diagnosis not present

## 2022-09-25 DIAGNOSIS — Z9181 History of falling: Secondary | ICD-10-CM | POA: Diagnosis not present

## 2022-09-25 DIAGNOSIS — E039 Hypothyroidism, unspecified: Secondary | ICD-10-CM | POA: Diagnosis not present

## 2022-09-25 DIAGNOSIS — E663 Overweight: Secondary | ICD-10-CM | POA: Diagnosis not present

## 2022-09-25 DIAGNOSIS — L89151 Pressure ulcer of sacral region, stage 1: Secondary | ICD-10-CM | POA: Diagnosis not present

## 2022-09-25 DIAGNOSIS — I2510 Atherosclerotic heart disease of native coronary artery without angina pectoris: Secondary | ICD-10-CM | POA: Diagnosis not present

## 2022-09-25 DIAGNOSIS — I951 Orthostatic hypotension: Secondary | ICD-10-CM | POA: Diagnosis not present

## 2022-09-25 DIAGNOSIS — N400 Enlarged prostate without lower urinary tract symptoms: Secondary | ICD-10-CM | POA: Diagnosis not present

## 2022-09-25 DIAGNOSIS — Z87440 Personal history of urinary (tract) infections: Secondary | ICD-10-CM | POA: Diagnosis not present

## 2022-09-29 DIAGNOSIS — I10 Essential (primary) hypertension: Secondary | ICD-10-CM | POA: Diagnosis not present

## 2022-09-29 DIAGNOSIS — Z79899 Other long term (current) drug therapy: Secondary | ICD-10-CM | POA: Diagnosis not present

## 2022-09-29 DIAGNOSIS — N1831 Chronic kidney disease, stage 3a: Secondary | ICD-10-CM | POA: Diagnosis not present

## 2022-09-29 DIAGNOSIS — R7303 Prediabetes: Secondary | ICD-10-CM | POA: Diagnosis not present

## 2022-09-29 DIAGNOSIS — I5022 Chronic systolic (congestive) heart failure: Secondary | ICD-10-CM | POA: Diagnosis not present

## 2022-10-03 DIAGNOSIS — E663 Overweight: Secondary | ICD-10-CM | POA: Diagnosis not present

## 2022-10-03 DIAGNOSIS — Z9181 History of falling: Secondary | ICD-10-CM | POA: Diagnosis not present

## 2022-10-03 DIAGNOSIS — M1990 Unspecified osteoarthritis, unspecified site: Secondary | ICD-10-CM | POA: Diagnosis not present

## 2022-10-03 DIAGNOSIS — N1832 Chronic kidney disease, stage 3b: Secondary | ICD-10-CM | POA: Diagnosis not present

## 2022-10-03 DIAGNOSIS — I5033 Acute on chronic diastolic (congestive) heart failure: Secondary | ICD-10-CM | POA: Diagnosis not present

## 2022-10-03 DIAGNOSIS — I130 Hypertensive heart and chronic kidney disease with heart failure and stage 1 through stage 4 chronic kidney disease, or unspecified chronic kidney disease: Secondary | ICD-10-CM | POA: Diagnosis not present

## 2022-10-03 DIAGNOSIS — N400 Enlarged prostate without lower urinary tract symptoms: Secondary | ICD-10-CM | POA: Diagnosis not present

## 2022-10-03 DIAGNOSIS — E871 Hypo-osmolality and hyponatremia: Secondary | ICD-10-CM | POA: Diagnosis not present

## 2022-10-03 DIAGNOSIS — F32A Depression, unspecified: Secondary | ICD-10-CM | POA: Diagnosis not present

## 2022-10-03 DIAGNOSIS — N179 Acute kidney failure, unspecified: Secondary | ICD-10-CM | POA: Diagnosis not present

## 2022-10-03 DIAGNOSIS — I951 Orthostatic hypotension: Secondary | ICD-10-CM | POA: Diagnosis not present

## 2022-10-03 DIAGNOSIS — Z87440 Personal history of urinary (tract) infections: Secondary | ICD-10-CM | POA: Diagnosis not present

## 2022-10-03 DIAGNOSIS — I2510 Atherosclerotic heart disease of native coronary artery without angina pectoris: Secondary | ICD-10-CM | POA: Diagnosis not present

## 2022-10-03 DIAGNOSIS — F419 Anxiety disorder, unspecified: Secondary | ICD-10-CM | POA: Diagnosis not present

## 2022-10-03 DIAGNOSIS — E039 Hypothyroidism, unspecified: Secondary | ICD-10-CM | POA: Diagnosis not present

## 2022-10-03 DIAGNOSIS — D508 Other iron deficiency anemias: Secondary | ICD-10-CM | POA: Diagnosis not present

## 2022-10-05 DIAGNOSIS — L89602 Pressure ulcer of unspecified heel, stage 2: Secondary | ICD-10-CM | POA: Diagnosis not present

## 2022-10-30 DIAGNOSIS — N1832 Chronic kidney disease, stage 3b: Secondary | ICD-10-CM | POA: Diagnosis not present

## 2022-10-30 DIAGNOSIS — I2510 Atherosclerotic heart disease of native coronary artery without angina pectoris: Secondary | ICD-10-CM | POA: Diagnosis not present

## 2022-10-30 DIAGNOSIS — Z9181 History of falling: Secondary | ICD-10-CM | POA: Diagnosis not present

## 2022-10-30 DIAGNOSIS — D508 Other iron deficiency anemias: Secondary | ICD-10-CM | POA: Diagnosis not present

## 2022-10-30 DIAGNOSIS — Z87440 Personal history of urinary (tract) infections: Secondary | ICD-10-CM | POA: Diagnosis not present

## 2022-10-30 DIAGNOSIS — N179 Acute kidney failure, unspecified: Secondary | ICD-10-CM | POA: Diagnosis not present

## 2022-10-30 DIAGNOSIS — E039 Hypothyroidism, unspecified: Secondary | ICD-10-CM | POA: Diagnosis not present

## 2022-10-30 DIAGNOSIS — N400 Enlarged prostate without lower urinary tract symptoms: Secondary | ICD-10-CM | POA: Diagnosis not present

## 2022-10-30 DIAGNOSIS — E871 Hypo-osmolality and hyponatremia: Secondary | ICD-10-CM | POA: Diagnosis not present

## 2022-10-30 DIAGNOSIS — E663 Overweight: Secondary | ICD-10-CM | POA: Diagnosis not present

## 2022-10-30 DIAGNOSIS — I951 Orthostatic hypotension: Secondary | ICD-10-CM | POA: Diagnosis not present

## 2022-10-30 DIAGNOSIS — I130 Hypertensive heart and chronic kidney disease with heart failure and stage 1 through stage 4 chronic kidney disease, or unspecified chronic kidney disease: Secondary | ICD-10-CM | POA: Diagnosis not present

## 2022-10-30 DIAGNOSIS — F419 Anxiety disorder, unspecified: Secondary | ICD-10-CM | POA: Diagnosis not present

## 2022-10-30 DIAGNOSIS — I5033 Acute on chronic diastolic (congestive) heart failure: Secondary | ICD-10-CM | POA: Diagnosis not present

## 2022-10-30 DIAGNOSIS — F32A Depression, unspecified: Secondary | ICD-10-CM | POA: Diagnosis not present

## 2022-10-30 DIAGNOSIS — M1990 Unspecified osteoarthritis, unspecified site: Secondary | ICD-10-CM | POA: Diagnosis not present

## 2022-12-18 DIAGNOSIS — I5033 Acute on chronic diastolic (congestive) heart failure: Secondary | ICD-10-CM | POA: Diagnosis not present

## 2022-12-18 DIAGNOSIS — N179 Acute kidney failure, unspecified: Secondary | ICD-10-CM | POA: Diagnosis not present

## 2022-12-18 DIAGNOSIS — D508 Other iron deficiency anemias: Secondary | ICD-10-CM | POA: Diagnosis not present

## 2022-12-18 DIAGNOSIS — I2510 Atherosclerotic heart disease of native coronary artery without angina pectoris: Secondary | ICD-10-CM | POA: Diagnosis not present

## 2022-12-18 DIAGNOSIS — I951 Orthostatic hypotension: Secondary | ICD-10-CM | POA: Diagnosis not present

## 2022-12-18 DIAGNOSIS — N1832 Chronic kidney disease, stage 3b: Secondary | ICD-10-CM | POA: Diagnosis not present

## 2022-12-18 DIAGNOSIS — I130 Hypertensive heart and chronic kidney disease with heart failure and stage 1 through stage 4 chronic kidney disease, or unspecified chronic kidney disease: Secondary | ICD-10-CM | POA: Diagnosis not present

## 2022-12-19 DIAGNOSIS — I5022 Chronic systolic (congestive) heart failure: Secondary | ICD-10-CM | POA: Diagnosis not present

## 2022-12-19 DIAGNOSIS — L89602 Pressure ulcer of unspecified heel, stage 2: Secondary | ICD-10-CM | POA: Diagnosis not present

## 2022-12-19 DIAGNOSIS — N1831 Chronic kidney disease, stage 3a: Secondary | ICD-10-CM | POA: Diagnosis not present

## 2023-01-30 DIAGNOSIS — N401 Enlarged prostate with lower urinary tract symptoms: Secondary | ICD-10-CM | POA: Diagnosis not present

## 2023-01-30 DIAGNOSIS — N1831 Chronic kidney disease, stage 3a: Secondary | ICD-10-CM | POA: Diagnosis not present

## 2023-01-30 DIAGNOSIS — R7303 Prediabetes: Secondary | ICD-10-CM | POA: Diagnosis not present

## 2023-01-30 DIAGNOSIS — I5022 Chronic systolic (congestive) heart failure: Secondary | ICD-10-CM | POA: Diagnosis not present

## 2023-01-30 DIAGNOSIS — Z79899 Other long term (current) drug therapy: Secondary | ICD-10-CM | POA: Diagnosis not present

## 2023-01-30 DIAGNOSIS — I10 Essential (primary) hypertension: Secondary | ICD-10-CM | POA: Diagnosis not present

## 2023-01-30 DIAGNOSIS — R351 Nocturia: Secondary | ICD-10-CM | POA: Diagnosis not present

## 2023-03-13 DIAGNOSIS — N400 Enlarged prostate without lower urinary tract symptoms: Secondary | ICD-10-CM | POA: Diagnosis not present

## 2023-03-13 DIAGNOSIS — I509 Heart failure, unspecified: Secondary | ICD-10-CM | POA: Diagnosis not present

## 2023-03-13 DIAGNOSIS — Z7982 Long term (current) use of aspirin: Secondary | ICD-10-CM | POA: Diagnosis not present

## 2023-03-13 DIAGNOSIS — L89621 Pressure ulcer of left heel, stage 1: Secondary | ICD-10-CM | POA: Diagnosis not present

## 2023-03-13 DIAGNOSIS — E663 Overweight: Secondary | ICD-10-CM | POA: Diagnosis not present

## 2023-03-13 DIAGNOSIS — I10 Essential (primary) hypertension: Secondary | ICD-10-CM | POA: Diagnosis not present

## 2023-03-13 DIAGNOSIS — G8929 Other chronic pain: Secondary | ICD-10-CM | POA: Diagnosis not present

## 2023-03-13 DIAGNOSIS — L89611 Pressure ulcer of right heel, stage 1: Secondary | ICD-10-CM | POA: Diagnosis not present

## 2023-03-13 DIAGNOSIS — I110 Hypertensive heart disease with heart failure: Secondary | ICD-10-CM | POA: Diagnosis not present

## 2023-03-13 DIAGNOSIS — R32 Unspecified urinary incontinence: Secondary | ICD-10-CM | POA: Diagnosis not present

## 2023-03-13 DIAGNOSIS — K5900 Constipation, unspecified: Secondary | ICD-10-CM | POA: Diagnosis not present

## 2023-04-17 DIAGNOSIS — I10 Essential (primary) hypertension: Secondary | ICD-10-CM | POA: Diagnosis not present

## 2023-04-17 DIAGNOSIS — N1831 Chronic kidney disease, stage 3a: Secondary | ICD-10-CM | POA: Diagnosis not present

## 2023-04-17 DIAGNOSIS — R7303 Prediabetes: Secondary | ICD-10-CM | POA: Diagnosis not present

## 2023-04-17 DIAGNOSIS — Z79899 Other long term (current) drug therapy: Secondary | ICD-10-CM | POA: Diagnosis not present

## 2023-04-26 DIAGNOSIS — Z79899 Other long term (current) drug therapy: Secondary | ICD-10-CM | POA: Diagnosis not present

## 2023-04-26 DIAGNOSIS — D631 Anemia in chronic kidney disease: Secondary | ICD-10-CM | POA: Diagnosis not present

## 2023-04-26 DIAGNOSIS — N401 Enlarged prostate with lower urinary tract symptoms: Secondary | ICD-10-CM | POA: Diagnosis not present

## 2023-04-26 DIAGNOSIS — R351 Nocturia: Secondary | ICD-10-CM | POA: Diagnosis not present

## 2023-04-26 DIAGNOSIS — Z0000 Encounter for general adult medical examination without abnormal findings: Secondary | ICD-10-CM | POA: Diagnosis not present

## 2023-04-26 DIAGNOSIS — I5022 Chronic systolic (congestive) heart failure: Secondary | ICD-10-CM | POA: Diagnosis not present

## 2023-04-26 DIAGNOSIS — N1831 Chronic kidney disease, stage 3a: Secondary | ICD-10-CM | POA: Diagnosis not present

## 2023-04-26 DIAGNOSIS — Z23 Encounter for immunization: Secondary | ICD-10-CM | POA: Diagnosis not present

## 2023-04-26 DIAGNOSIS — I10 Essential (primary) hypertension: Secondary | ICD-10-CM | POA: Diagnosis not present

## 2023-04-26 DIAGNOSIS — Z1331 Encounter for screening for depression: Secondary | ICD-10-CM | POA: Diagnosis not present

## 2023-04-26 DIAGNOSIS — N183 Chronic kidney disease, stage 3 unspecified: Secondary | ICD-10-CM | POA: Diagnosis not present

## 2023-04-26 DIAGNOSIS — R7303 Prediabetes: Secondary | ICD-10-CM | POA: Diagnosis not present

## 2023-07-12 DIAGNOSIS — B351 Tinea unguium: Secondary | ICD-10-CM | POA: Diagnosis not present

## 2023-07-12 DIAGNOSIS — R7303 Prediabetes: Secondary | ICD-10-CM | POA: Diagnosis not present

## 2023-07-12 DIAGNOSIS — L600 Ingrowing nail: Secondary | ICD-10-CM | POA: Diagnosis not present

## 2023-07-12 DIAGNOSIS — M79674 Pain in right toe(s): Secondary | ICD-10-CM | POA: Diagnosis not present

## 2023-07-12 DIAGNOSIS — M79675 Pain in left toe(s): Secondary | ICD-10-CM | POA: Diagnosis not present

## 2023-10-17 DIAGNOSIS — N1831 Chronic kidney disease, stage 3a: Secondary | ICD-10-CM | POA: Diagnosis not present

## 2023-10-17 DIAGNOSIS — Z79899 Other long term (current) drug therapy: Secondary | ICD-10-CM | POA: Diagnosis not present

## 2023-10-25 DIAGNOSIS — R5381 Other malaise: Secondary | ICD-10-CM | POA: Diagnosis not present

## 2023-10-25 DIAGNOSIS — N1831 Chronic kidney disease, stage 3a: Secondary | ICD-10-CM | POA: Diagnosis not present

## 2023-10-25 DIAGNOSIS — L89620 Pressure ulcer of left heel, unstageable: Secondary | ICD-10-CM | POA: Diagnosis not present

## 2023-10-25 DIAGNOSIS — I5022 Chronic systolic (congestive) heart failure: Secondary | ICD-10-CM | POA: Diagnosis not present

## 2023-10-25 DIAGNOSIS — R7303 Prediabetes: Secondary | ICD-10-CM | POA: Diagnosis not present

## 2023-10-25 DIAGNOSIS — R29898 Other symptoms and signs involving the musculoskeletal system: Secondary | ICD-10-CM | POA: Diagnosis not present

## 2023-10-25 DIAGNOSIS — D631 Anemia in chronic kidney disease: Secondary | ICD-10-CM | POA: Diagnosis not present

## 2023-10-25 DIAGNOSIS — N183 Chronic kidney disease, stage 3 unspecified: Secondary | ICD-10-CM | POA: Diagnosis not present

## 2023-10-25 DIAGNOSIS — I10 Essential (primary) hypertension: Secondary | ICD-10-CM | POA: Diagnosis not present

## 2023-10-25 DIAGNOSIS — N1832 Chronic kidney disease, stage 3b: Secondary | ICD-10-CM | POA: Diagnosis not present

## 2023-10-25 NOTE — Progress Notes (Signed)
 Jerome [name] Tod Raddle. [name] is a 89 y.o. male who is here for a follow up visit  He has someone coming to his house every day to help him with ADLs. He is getting meals from [employer].   HTN: He is taking coreg  and amlodipine  and he tolerates this well. His blood pressure has been at goal.  CHF: His fluid balance has been fine. He is not currently taking a diuretic.  CKD: His Cr was 1.8 on recent labs.  Patient Active Problem List  Diagnosis  . Gout  . Essential hypertension, benign  . Chronic systolic CHF (congestive heart failure) (CMS/HHS-HCC)  . Right leg pain  . CKD (chronic kidney disease) stage 3, GFR 30-59 ml/min (CMS/HHS-HCC)  . CAD (coronary artery disease)  . Primary osteoarthritis involving multiple joints  . Prediabetes    Past Medical History:  Diagnosis Date  . Carpal tunnel syndrome   . History of colon polyps   . Hypertension   . Mild mitral regurgitation   . Mild renal insufficiency   . Morbid obesity (CMS/HHS-HCC)   . Varicosities of leg      Current Outpatient Medications:  .  amLODIPine  (NORVASC ) 2.5 MG tablet, TAKE 1 TABLET BY MOUTH EVERY DAY, Disp: 90 tablet, Rfl: 1 .  ascorbic acid , vitamin C , (VITAMIN C ) 500 MG tablet, Take 500 mg by mouth 2 (two) times daily, Disp: , Rfl:  .  aspirin  81 MG EC tablet, Take 1 tablet (81 mg total) by mouth once daily, Disp: , Rfl:  .  carvediloL  (COREG ) 6.25 MG tablet, TAKE 1 TABLET BY MOUTH TWICE A DAY WITH FOOD, Disp: 180 tablet, Rfl: 1 .  cyanocobalamin  (VITAMIN B12) 1000 MCG tablet, Take 1,000 mcg by mouth once daily, Disp: , Rfl:  .  multivitamin tablet, Take 1 tablet by mouth once daily, Disp: , Rfl:  .  PURELAX powder, MIX 17 GRAMS IN 4-8 OUNCES OF FLUID & TAKE DAILY AS NEEDED FOR CONSTIPATION FOR UP TO 30 DAYS, Disp: 850 g, Rfl: 0 .  tamsulosin  (FLOMAX ) 0.4 mg capsule, TAKE 1 CAPSULE BY MOUTH EVERY DAY, Disp: 90 capsule, Rfl: 1 .  venlafaxine (EFFEXOR-XR) 150 MG XR capsule, Take 1 capsule (150 mg  total) by mouth once daily, Disp: 90 capsule, Rfl: 1 .  venlafaxine (EFFEXOR-XR) 37.5 MG XR capsule, TAKE 1 CAPSULE (37.5 MG TOTAL) BY MOUTH ONCE DAILY TAKE ALONG WITH 75 MG TO MAKE A TOTAL OF 112.5 MG (Patient not taking: Reported on 10/25/2023), Disp: 90 capsule, Rfl: 1 .  venlafaxine (EFFEXOR-XR) 75 MG XR capsule, take 1 capsule by mouth every day, Disp: 90 capsule, Rfl: 1  Allergies  Allergen Reactions  . Gabapentin  Other (See Comments)    hypotension    Results for orders placed or performed in visit on 10/17/23  CBC w/auto Differential (5 Part)  Result Value Ref Range   WBC (White Blood Cell Count) 5.8 4.1 - 10.2 10^3/uL   RBC (Red Blood Cell Count) 2.96 (L) 4.69 - 6.13 10^6/uL   Hemoglobin 9.7 (L) 14.1 - 18.1 gm/dL   Hematocrit 69.3 (L) 59.9 - 52.0 %   MCV (Mean Corpuscular Volume) 103.4 (H) 80.0 - 100.0 fl   MCH (Mean Corpuscular Hemoglobin) 32.8 (H) 27.0 - 31.2 pg   MCHC (Mean Corpuscular Hemoglobin Concentration) 31.7 (L) 32.0 - 36.0 gm/dL   Platelet Count 807 849 - 450 10^3/uL   RDW-CV (Red Cell Distribution Width) 14.5 11.6 - 14.8 %   MPV (Mean Platelet Volume)  12.8 (H) 9.4 - 12.4 fl   Neutrophils 3.67 1.50 - 7.80 10^3/uL   Lymphocytes 1.11 1.00 - 3.60 10^3/uL   Monocytes 0.74 0.00 - 1.50 10^3/uL   Eosinophils 0.23 0.00 - 0.55 10^3/uL   Basophils 0.03 0.00 - 0.09 10^3/uL   Neutrophil % 63.3 32.0 - 70.0 %   Lymphocyte % 19.2 10.0 - 50.0 %   Monocyte % 12.8 4.0 - 13.0 %   Eosinophil % 4.0 1.0 - 5.0 %   Basophil% 0.5 0.0 - 2.0 %   Immature Granulocyte % 0.2 <=0.7 %   Immature Granulocyte Count 0.01 <=0.06 10^3/L  Comprehensive Metabolic Panel (CMP)  Result Value Ref Range   Glucose 79 70 - 110 mg/dL   Sodium 860 863 - 854 mmol/L   Potassium 5.3 (H) 3.6 - 5.1 mmol/L   Chloride 106 97 - 109 mmol/L   Carbon Dioxide (CO2) 28.8 22.0 - 32.0 mmol/L   Urea Nitrogen (BUN) 41 (H) 7 - 25 mg/dL   Creatinine 1.8 (H) 0.7 - 1.3 mg/dL   Glomerular Filtration Rate (eGFR) 36 (L) >60  mL/min/1.73sq m   Calcium 8.8 8.7 - 10.3 mg/dL   AST  15 8 - 39 U/L   ALT  12 6 - 57 U/L   Alk Phos (alkaline Phosphatase) 66 34 - 104 U/L   Albumin 3.1 (L) 3.5 - 4.8 g/dL   Bilirubin, Total 0.4 0.3 - 1.2 mg/dL   Protein, Total 6.7 6.1 - 7.9 g/dL   A/G Ratio 0.9 (L) 1.0 - 5.0 gm/dL  Microalbumin/Creatinine Ratio, Random Urine  Result Value Ref Range   Creatinine, Random Urine 94.5 40.0 - 300.0 mg/dL   Urine Albumin, Random 24   mg/L   Urine Albumin/Creatinine Ratio 25.4 <30.0 ug/mg     Review of Systems  No fever, chills, nausea, or vomiting   Exam  Vitals:   10/25/23 1338  BP: 124/80  Pulse: 64  SpO2: 98%  Weight: 91.6 kg (202 lb)  Height: 172.7 cm (5' 8)    Body mass index is 30.71 kg/m.  General: Patient is well-groomed, well-nourished, appears stated age in no acute distress  HEENT: head is atraumatic and normocephalic, neck is supple with no palpable nodules  CV: Regular rhythm and normal heart rate, no murmur, no JVD  Respiratory: Clear to auscultation throughout lung fields with no wheezing, crackles, or rhonchi. No increased work of breathing   Impression/Plan  HTN: Patient is tolerating medicine well and is compliant with medicine. I discussed limiting sodium intake and keeping track of some blood pressure readings. Blood pressure is at goal on current regimen.  CKD: His Cr is stable. I discussed that it is not safe for him to be on NSAIDs.  Debility: I have some concerns about his current living situation. He feels like it is fine. He has someone checking on him every day.  Chronic anemia: His hemoglobin is stable.  CHF: His fluid balance if good.  Chronic pain: I will have him try to increase his venlafaxine.  This visit was part of longitudinal care for this patient involving long-term management of chronic medical conditions.

## 2024-01-17 DIAGNOSIS — G809 Cerebral palsy, unspecified: Secondary | ICD-10-CM | POA: Diagnosis not present

## 2024-01-17 DIAGNOSIS — Z7982 Long term (current) use of aspirin: Secondary | ICD-10-CM | POA: Diagnosis not present

## 2024-01-17 DIAGNOSIS — N400 Enlarged prostate without lower urinary tract symptoms: Secondary | ICD-10-CM | POA: Diagnosis not present

## 2024-01-17 DIAGNOSIS — E663 Overweight: Secondary | ICD-10-CM | POA: Diagnosis not present

## 2024-01-17 DIAGNOSIS — I10 Essential (primary) hypertension: Secondary | ICD-10-CM | POA: Diagnosis not present

## 2024-04-28 DIAGNOSIS — L89620 Pressure ulcer of left heel, unstageable: Secondary | ICD-10-CM | POA: Diagnosis not present

## 2024-04-28 DIAGNOSIS — R5381 Other malaise: Secondary | ICD-10-CM | POA: Diagnosis not present

## 2024-04-28 DIAGNOSIS — R29898 Other symptoms and signs involving the musculoskeletal system: Secondary | ICD-10-CM | POA: Diagnosis not present

## 2024-04-28 DIAGNOSIS — I5022 Chronic systolic (congestive) heart failure: Secondary | ICD-10-CM | POA: Diagnosis not present

## 2024-05-01 DIAGNOSIS — I5022 Chronic systolic (congestive) heart failure: Secondary | ICD-10-CM | POA: Diagnosis not present

## 2024-05-01 DIAGNOSIS — Z1331 Encounter for screening for depression: Secondary | ICD-10-CM | POA: Diagnosis not present

## 2024-05-01 DIAGNOSIS — L89620 Pressure ulcer of left heel, unstageable: Secondary | ICD-10-CM | POA: Diagnosis not present

## 2024-05-01 DIAGNOSIS — N1831 Chronic kidney disease, stage 3a: Secondary | ICD-10-CM | POA: Diagnosis not present

## 2024-05-01 DIAGNOSIS — R29898 Other symptoms and signs involving the musculoskeletal system: Secondary | ICD-10-CM | POA: Diagnosis not present

## 2024-05-01 DIAGNOSIS — R7303 Prediabetes: Secondary | ICD-10-CM | POA: Diagnosis not present

## 2024-05-01 DIAGNOSIS — Z0000 Encounter for general adult medical examination without abnormal findings: Secondary | ICD-10-CM | POA: Diagnosis not present

## 2024-05-01 DIAGNOSIS — Z79899 Other long term (current) drug therapy: Secondary | ICD-10-CM | POA: Diagnosis not present

## 2024-05-01 DIAGNOSIS — R5381 Other malaise: Secondary | ICD-10-CM | POA: Diagnosis not present

## 2024-05-01 DIAGNOSIS — N183 Chronic kidney disease, stage 3 unspecified: Secondary | ICD-10-CM | POA: Diagnosis not present

## 2024-05-01 DIAGNOSIS — I10 Essential (primary) hypertension: Secondary | ICD-10-CM | POA: Diagnosis not present

## 2024-05-01 DIAGNOSIS — Z23 Encounter for immunization: Secondary | ICD-10-CM | POA: Diagnosis not present

## 2024-05-08 DIAGNOSIS — E875 Hyperkalemia: Secondary | ICD-10-CM | POA: Diagnosis not present

## 2024-05-09 DIAGNOSIS — R5381 Other malaise: Secondary | ICD-10-CM | POA: Diagnosis not present

## 2024-05-09 DIAGNOSIS — I130 Hypertensive heart and chronic kidney disease with heart failure and stage 1 through stage 4 chronic kidney disease, or unspecified chronic kidney disease: Secondary | ICD-10-CM | POA: Diagnosis not present

## 2024-05-09 DIAGNOSIS — M150 Primary generalized (osteo)arthritis: Secondary | ICD-10-CM | POA: Diagnosis not present

## 2024-05-09 DIAGNOSIS — Z96651 Presence of right artificial knee joint: Secondary | ICD-10-CM | POA: Diagnosis not present

## 2024-05-09 DIAGNOSIS — Z993 Dependence on wheelchair: Secondary | ICD-10-CM | POA: Diagnosis not present

## 2024-05-09 DIAGNOSIS — N289 Disorder of kidney and ureter, unspecified: Secondary | ICD-10-CM | POA: Diagnosis not present

## 2024-05-09 DIAGNOSIS — N1831 Chronic kidney disease, stage 3a: Secondary | ICD-10-CM | POA: Diagnosis not present

## 2024-05-09 DIAGNOSIS — Z9089 Acquired absence of other organs: Secondary | ICD-10-CM | POA: Diagnosis not present

## 2024-05-09 DIAGNOSIS — Z96641 Presence of right artificial hip joint: Secondary | ICD-10-CM | POA: Diagnosis not present

## 2024-05-09 DIAGNOSIS — Z860100 Personal history of colon polyps, unspecified: Secondary | ICD-10-CM | POA: Diagnosis not present

## 2024-05-09 DIAGNOSIS — D631 Anemia in chronic kidney disease: Secondary | ICD-10-CM | POA: Diagnosis not present

## 2024-05-09 DIAGNOSIS — Z602 Problems related to living alone: Secondary | ICD-10-CM | POA: Diagnosis not present

## 2024-05-09 DIAGNOSIS — R7303 Prediabetes: Secondary | ICD-10-CM | POA: Diagnosis not present

## 2024-05-09 DIAGNOSIS — Z7982 Long term (current) use of aspirin: Secondary | ICD-10-CM | POA: Diagnosis not present

## 2024-05-09 DIAGNOSIS — M1030 Gout due to renal impairment, unspecified site: Secondary | ICD-10-CM | POA: Diagnosis not present

## 2024-05-09 DIAGNOSIS — L89620 Pressure ulcer of left heel, unstageable: Secondary | ICD-10-CM | POA: Diagnosis not present

## 2024-05-09 DIAGNOSIS — I2510 Atherosclerotic heart disease of native coronary artery without angina pectoris: Secondary | ICD-10-CM | POA: Diagnosis not present

## 2024-05-09 DIAGNOSIS — I340 Nonrheumatic mitral (valve) insufficiency: Secondary | ICD-10-CM | POA: Diagnosis not present

## 2024-05-09 DIAGNOSIS — Z79899 Other long term (current) drug therapy: Secondary | ICD-10-CM | POA: Diagnosis not present

## 2024-05-09 DIAGNOSIS — Z9181 History of falling: Secondary | ICD-10-CM | POA: Diagnosis not present

## 2024-05-09 DIAGNOSIS — I5022 Chronic systolic (congestive) heart failure: Secondary | ICD-10-CM | POA: Diagnosis not present

## 2024-05-17 ENCOUNTER — Other Ambulatory Visit: Payer: Self-pay

## 2024-05-17 ENCOUNTER — Emergency Department

## 2024-05-17 ENCOUNTER — Observation Stay

## 2024-05-17 ENCOUNTER — Inpatient Hospital Stay
Admission: EM | Admit: 2024-05-17 | Discharge: 2024-05-23 | DRG: 291 | Disposition: A | Discharge status: Skilled Nursing Facility | Attending: Obstetrics and Gynecology | Admitting: Obstetrics and Gynecology

## 2024-05-17 DIAGNOSIS — M1612 Unilateral primary osteoarthritis, left hip: Secondary | ICD-10-CM | POA: Diagnosis not present

## 2024-05-17 DIAGNOSIS — N390 Urinary tract infection, site not specified: Secondary | ICD-10-CM | POA: Diagnosis not present

## 2024-05-17 DIAGNOSIS — I5032 Chronic diastolic (congestive) heart failure: Secondary | ICD-10-CM | POA: Diagnosis present

## 2024-05-17 DIAGNOSIS — M7732 Calcaneal spur, left foot: Secondary | ICD-10-CM | POA: Diagnosis not present

## 2024-05-17 DIAGNOSIS — I10 Essential (primary) hypertension: Secondary | ICD-10-CM | POA: Diagnosis not present

## 2024-05-17 DIAGNOSIS — I2583 Coronary atherosclerosis due to lipid rich plaque: Secondary | ICD-10-CM | POA: Diagnosis not present

## 2024-05-17 DIAGNOSIS — I4891 Unspecified atrial fibrillation: Secondary | ICD-10-CM | POA: Diagnosis present

## 2024-05-17 DIAGNOSIS — N1832 Chronic kidney disease, stage 3b: Secondary | ICD-10-CM | POA: Diagnosis not present

## 2024-05-17 DIAGNOSIS — I5022 Chronic systolic (congestive) heart failure: Secondary | ICD-10-CM | POA: Diagnosis not present

## 2024-05-17 DIAGNOSIS — I509 Heart failure, unspecified: Secondary | ICD-10-CM

## 2024-05-17 DIAGNOSIS — N1831 Chronic kidney disease, stage 3a: Secondary | ICD-10-CM | POA: Diagnosis not present

## 2024-05-17 DIAGNOSIS — L89620 Pressure ulcer of left heel, unstageable: Secondary | ICD-10-CM | POA: Diagnosis not present

## 2024-05-17 DIAGNOSIS — I5A Non-ischemic myocardial injury (non-traumatic): Secondary | ICD-10-CM | POA: Diagnosis not present

## 2024-05-17 DIAGNOSIS — N3000 Acute cystitis without hematuria: Secondary | ICD-10-CM | POA: Diagnosis not present

## 2024-05-17 DIAGNOSIS — I5031 Acute diastolic (congestive) heart failure: Secondary | ICD-10-CM | POA: Diagnosis not present

## 2024-05-17 DIAGNOSIS — M79672 Pain in left foot: Secondary | ICD-10-CM | POA: Diagnosis not present

## 2024-05-17 DIAGNOSIS — N400 Enlarged prostate without lower urinary tract symptoms: Secondary | ICD-10-CM | POA: Diagnosis present

## 2024-05-17 DIAGNOSIS — D509 Iron deficiency anemia, unspecified: Secondary | ICD-10-CM | POA: Diagnosis not present

## 2024-05-17 DIAGNOSIS — M1030 Gout due to renal impairment, unspecified site: Secondary | ICD-10-CM | POA: Diagnosis not present

## 2024-05-17 DIAGNOSIS — Z515 Encounter for palliative care: Secondary | ICD-10-CM | POA: Diagnosis not present

## 2024-05-17 DIAGNOSIS — I130 Hypertensive heart and chronic kidney disease with heart failure and stage 1 through stage 4 chronic kidney disease, or unspecified chronic kidney disease: Secondary | ICD-10-CM | POA: Diagnosis not present

## 2024-05-17 DIAGNOSIS — M7989 Other specified soft tissue disorders: Secondary | ICD-10-CM | POA: Diagnosis not present

## 2024-05-17 DIAGNOSIS — M25552 Pain in left hip: Secondary | ICD-10-CM | POA: Diagnosis not present

## 2024-05-17 DIAGNOSIS — R443 Hallucinations, unspecified: Secondary | ICD-10-CM | POA: Diagnosis not present

## 2024-05-17 DIAGNOSIS — R29818 Other symptoms and signs involving the nervous system: Secondary | ICD-10-CM | POA: Diagnosis not present

## 2024-05-17 DIAGNOSIS — I5033 Acute on chronic diastolic (congestive) heart failure: Secondary | ICD-10-CM | POA: Diagnosis not present

## 2024-05-17 DIAGNOSIS — I2510 Atherosclerotic heart disease of native coronary artery without angina pectoris: Secondary | ICD-10-CM | POA: Diagnosis not present

## 2024-05-17 DIAGNOSIS — M85872 Other specified disorders of bone density and structure, left ankle and foot: Secondary | ICD-10-CM | POA: Diagnosis not present

## 2024-05-17 DIAGNOSIS — T68XXXA Hypothermia, initial encounter: Secondary | ICD-10-CM | POA: Diagnosis present

## 2024-05-17 DIAGNOSIS — M8580 Other specified disorders of bone density and structure, unspecified site: Secondary | ICD-10-CM | POA: Diagnosis not present

## 2024-05-17 DIAGNOSIS — R001 Bradycardia, unspecified: Secondary | ICD-10-CM | POA: Diagnosis not present

## 2024-05-17 DIAGNOSIS — D631 Anemia in chronic kidney disease: Secondary | ICD-10-CM | POA: Diagnosis not present

## 2024-05-17 DIAGNOSIS — Z7189 Other specified counseling: Secondary | ICD-10-CM | POA: Diagnosis not present

## 2024-05-17 DIAGNOSIS — F32A Depression, unspecified: Secondary | ICD-10-CM

## 2024-05-17 LAB — TROPONIN T, HIGH SENSITIVITY
Troponin T High Sensitivity: 85 ng/L — ABNORMAL HIGH (ref 0–19)
Troponin T High Sensitivity: 75 ng/L — ABNORMAL HIGH (ref 0–19)

## 2024-05-17 LAB — LACTIC ACID, PLASMA: Lactic Acid, Venous: 1.9 mmol/L (ref 0.5–1.9)

## 2024-05-17 LAB — COMPREHENSIVE METABOLIC PANEL WITH GFR
ALT: 13 U/L (ref 0–44)
AST: 23 U/L (ref 15–41)
Albumin: 3.1 g/dL — ABNORMAL LOW (ref 3.5–5.0)
Alkaline Phosphatase: 87 U/L (ref 38–126)
Anion gap: 10 (ref 5–15)
BUN: 35 mg/dL — ABNORMAL HIGH (ref 8–23)
CO2: 24 mmol/L (ref 22–32)
Calcium: 8.9 mg/dL (ref 8.9–10.3)
Chloride: 105 mmol/L (ref 98–111)
Creatinine, Ser: 1.57 mg/dL — ABNORMAL HIGH (ref 0.61–1.24)
GFR, Estimated: 42 mL/min — ABNORMAL LOW (ref >60)
Glucose, Bld: 86 mg/dL (ref 70–99)
Potassium: 4.7 mmol/L (ref 3.5–5.1)
Sodium: 139 mmol/L (ref 135–145)
Total Bilirubin: 0.5 mg/dL (ref 0.0–1.2)
Total Protein: 7.5 g/dL (ref 6.5–8.1)

## 2024-05-17 LAB — URINALYSIS, ROUTINE W REFLEX MICROSCOPIC
Bilirubin Urine: NEGATIVE
Glucose, UA: NEGATIVE mg/dL
Hgb urine dipstick: NEGATIVE
Ketones, ur: NEGATIVE mg/dL
Nitrite: POSITIVE — AB
Protein, ur: NEGATIVE mg/dL
Specific Gravity, Urine: 1.013 (ref 1.005–1.030)
WBC, UA: >50 WBC/hpf (ref 0–5)
pH: 5 (ref 5.0–8.0)

## 2024-05-17 LAB — CBC
HCT: 33.1 % — ABNORMAL LOW (ref 39.0–52.0)
Hemoglobin: 10.4 g/dL — ABNORMAL LOW (ref 13.0–17.0)
MCH: 30.7 pg (ref 26.0–34.0)
MCHC: 31.4 g/dL (ref 30.0–36.0)
MCV: 97.6 fL (ref 80.0–100.0)
Platelets: 191 K/uL (ref 150–400)
RBC: 3.39 MIL/uL — ABNORMAL LOW (ref 4.22–5.81)
RDW: 17 % — ABNORMAL HIGH (ref 11.5–15.5)
WBC: 6.3 K/uL (ref 4.0–10.5)
nRBC: 0 % (ref 0.0–0.2)

## 2024-05-17 LAB — TSH: TSH: 8.43 u[IU]/mL — ABNORMAL HIGH (ref 0.350–4.500)

## 2024-05-17 LAB — MAGNESIUM: Magnesium: 2.2 mg/dL (ref 1.7–2.4)

## 2024-05-17 LAB — PRO BRAIN NATRIURETIC PEPTIDE: Pro Brain Natriuretic Peptide: 15434 pg/mL — ABNORMAL HIGH (ref <300.0)

## 2024-05-17 LAB — CK: Total CK: 48 U/L — ABNORMAL LOW (ref 49–397)

## 2024-05-17 LAB — CBG MONITORING, ED: Glucose-Capillary: 93 mg/dL (ref 70–99)

## 2024-05-17 LAB — T4, FREE: Free T4: 1.07 ng/dL (ref 0.61–1.12)

## 2024-05-17 MED ORDER — ACETAMINOPHEN 325 MG PO TABS
650.0000 mg | ORAL_TABLET | Freq: Once | ORAL | Status: AC
  Administered 2024-05-17: 650 mg via ORAL
  Filled 2024-05-17: qty 2

## 2024-05-17 MED ORDER — HEPARIN SODIUM (PORCINE) 5000 UNIT/ML IJ SOLN
5000.0000 [IU] | Freq: Three times a day (TID) | INTRAMUSCULAR | Status: DC
  Administered 2024-05-17 – 2024-05-18 (×2): 5000 [IU] via SUBCUTANEOUS
  Filled 2024-05-17 (×2): qty 1

## 2024-05-17 MED ORDER — METHOCARBAMOL 500 MG PO TABS
500.0000 mg | ORAL_TABLET | Freq: Three times a day (TID) | ORAL | Status: DC | PRN
  Administered 2024-05-22: 500 mg via ORAL
  Filled 2024-05-17: qty 1

## 2024-05-17 MED ORDER — ONDANSETRON HCL 4 MG/2ML IJ SOLN: 4.0000 mg | Freq: Three times a day (TID) | INTRAMUSCULAR | Status: DC | PRN

## 2024-05-17 MED ORDER — ASPIRIN 81 MG PO TBEC: 81.0000 mg | DELAYED_RELEASE_TABLET | Freq: Every day | ORAL | Status: DC

## 2024-05-17 MED ORDER — OXYCODONE-ACETAMINOPHEN 5-325 MG PO TABS
1.0000 | ORAL_TABLET | ORAL | Status: DC | PRN
  Administered 2024-05-18: 1 via ORAL
  Filled 2024-05-17: qty 1

## 2024-05-17 MED ORDER — SODIUM CHLORIDE 0.9 % IV SOLN
1.0000 g | Freq: Once | INTRAVENOUS | Status: AC
  Administered 2024-05-17: 1 g via INTRAVENOUS
  Filled 2024-05-17: qty 10

## 2024-05-17 MED ORDER — HYDRALAZINE HCL 20 MG/ML IJ SOLN: 5.0000 mg | INTRAMUSCULAR | Status: DC | PRN

## 2024-05-17 MED ORDER — TAMSULOSIN HCL 0.4 MG PO CAPS
0.4000 mg | ORAL_CAPSULE | Freq: Every day | ORAL | Status: DC
  Administered 2024-05-18 – 2024-05-22 (×6): 0.4 mg via ORAL
  Filled 2024-05-17 (×6): qty 1

## 2024-05-17 MED ORDER — SODIUM CHLORIDE 0.9 % IV SOLN
1.0000 g | INTRAVENOUS | Status: DC
  Administered 2024-05-19 – 2024-05-22 (×2): 1 g via INTRAVENOUS
  Filled 2024-05-17 (×6): qty 10

## 2024-05-17 MED ORDER — ACETAMINOPHEN 325 MG PO TABS
650.0000 mg | ORAL_TABLET | Freq: Four times a day (QID) | ORAL | Status: DC | PRN
  Administered 2024-05-22: 650 mg via ORAL
  Filled 2024-05-17: qty 2

## 2024-05-17 MED ORDER — FUROSEMIDE 10 MG/ML IJ SOLN
40.0000 mg | Freq: Two times a day (BID) | INTRAMUSCULAR | Status: DC
  Administered 2024-05-17 – 2024-05-19 (×4): 40 mg via INTRAVENOUS
  Filled 2024-05-17 (×4): qty 4

## 2024-05-17 MED ORDER — ASPIRIN 81 MG PO TBEC
81.0000 mg | DELAYED_RELEASE_TABLET | Freq: Every day | ORAL | Status: DC
  Administered 2024-05-18: 81 mg via ORAL
  Filled 2024-05-17: qty 1

## 2024-05-17 NOTE — H&P (Addendum)
 History and Physical    Jerome [name] Tod Raddle. FMW:[id number] DOB: [birth year] DOA: 05/17/2024  Referring MD/NP/PA:   PCP: Diedra Lame, MD   Patient coming from:  The patient is coming from home.     Chief Complaint: Hallucination, increased urinary frequency, pain in left heel and left hip  HPI: Jerome [name]. is a 89 y.o. male with medical history significant of HTN, CAD, dCHF, gout, depression with anxiety, BPH, CKD-3B, iron  deficiency anemia, who presents with hallucination, increased urinary frequency, pain in left heel and the left hip.  Per report, pt has hallucination for almost 2 weeks. He sees things and hears things which are not there.  Patient is oriented x 3 during the interview.  Patient complaints of pain in whole left leg, but on my examination, patient seems to have pain in left heel and left hip. He denies any fall or injury.  Patient does not have chest pain, cough, SOB.  No nausea, vomiting, diarrhea or abdominal pain.  He reports increased urinary frequency. He denies dysuria or burning on urination.  No hematuria.  No fever or chills. He has bilateral lower leg edema. He has some bruises in both arms.   Per report, pt lives alone. His son was POA until he passed away. Neighbors check in on him periodically. Per ED RN's report, neighbor reported that pt is bed bound mostly, and can stand with walker. Pt is scared at home   Data reviewed independently and ED Course: pt was found to have WBC 6.3, positive UA (hazy appearance, large amount of leukocyte, positive nitrite, many bacteria, WBC> 50), proBNP 150434, troponin 85, stable renal function.  Temperature 95.1, blood pressure 163/86, heart rate 40-60s, RR 16, oxygen SAT 92 and-98% on room air.  CT head negative for acute intracranial abnormalities.  Patient is admitted to PCU as inpatient. Dr. Rayfield of [facility] card is consulted.  EKG: I have personally reviewed.  Seem to be in A-fib with heart rate 85, right  bundle blockade, low voltage, early R wave progression.  The repeated EKG showed heart rate 43.    Review of Systems:   General: no fevers, chills, no body weight gain, has fatigue HEENT: no blurry vision, hearing changes or sore throat Respiratory: no dyspnea, coughing, wheezing CV: no chest pain, no palpitations GI: no nausea, vomiting, abdominal pain, diarrhea, constipation GU: no dysuria, burning on urination, increased urinary frequency, hematuria  Ext: has leg edema Neuro: no unilateral weakness, numbness, or tingling, no vision change or hearing loss. Has hallucination Skin: no rash, no skin tear. Has bruits in both arms MSK: has pain in left heel and left hip Heme: No easy bruising.  Travel history: No recent long distant travel.    Allergy:  Allergies  Allergen Reactions   Gabapentin  Other (See Comments)    hypotension    Past Medical History:  Diagnosis Date   Anxiety    Arthritis    CAD (coronary artery disease)    Chronic kidney disease    stage 3   Chronic systolic CHF (congestive heart failure) (HCC)    Depression    Hypertension     Past Surgical History:  Procedure Laterality Date   APPENDECTOMY     HERNIA REPAIR Right    inguinal   JOINT REPLACEMENT Right    knee   TOTAL HIP ARTHROPLASTY Right 01/21/2020   Procedure: TOTAL HIP ARTHROPLASTY;  Surgeon: Mardee Lynwood SQUIBB, MD;  Location: [facility] ORS;  Service: Orthopedics;  Laterality:  Right;    Social History:  reports that he has never smoked. He has never used smokeless tobacco. He reports that he does not drink alcohol and does not use drugs.  Family History:  Family History  Problem Relation Age of Onset   Heart failure Son      Prior to Admission medications   Medication Sig Start Date End Date Taking? Authorizing Provider  amLODipine  (NORVASC ) 5 MG tablet Take 5 mg by mouth daily.    [provider]  Ascorbic Acid  (VITAMIN C  WITH ROSE HIPS) 500 MG tablet Take 500 mg by mouth in the  morning and at bedtime.    [provider]  aspirin  EC 81 MG tablet Take 81 mg by mouth daily.    [provider]  carvedilol  (COREG ) 6.25 MG tablet Take 1 tablet (6.25 mg total) by mouth 2 (two) times daily with a meal. 12/12/18   Tobie Calix, MD  iron  polysaccharides (NIFEREX) 150 MG capsule Take 1 capsule (150 mg total) by mouth daily. 07/02/22   Laurita Pillion, MD  Multiple Vitamins-Minerals (MULTIVITAMIN WITH MINERALS) tablet Take 1 tablet by mouth daily.    [provider]  tamsulosin  (FLOMAX ) 0.4 MG CAPS capsule Take 0.4 mg by mouth daily at 6 PM.     [provider]  vitamin B-12 (CYANOCOBALAMIN ) 1000 MCG tablet Take 1,000 mcg by mouth daily.    [provider]    Physical Exam: Vitals:   05/17/24 1945 05/17/24 2123 05/17/24 2135 05/17/24 2220  BP: (!) 163/86  133/77   Pulse: 67  (!) 59 (!) 58  Resp: 16  16 14   Temp:  (!) 95.1 F (35.1 C)    TempSrc:  Rectal    SpO2: 92%  100% 99%  Height:       General: Not in acute distress HEENT:       Eyes: PERRL, EOMI, no jaundice       ENT: No discharge from the ears and nose, no pharynx injection, no tonsillar enlargement.        Neck: No JVD, no bruit, no mass felt. Heme: No neck lymph node enlargement. Cardiac: S1/S2, irregularly irregular rhythm, No gallops or rubs. Respiratory: No rales, wheezing, rhonchi or rubs. GI: Soft, nondistended, nontender, no rebound pain, no organomegaly, BS present. GU: No hematuria Ext: 2+ pitting leg edema bilaterally. 1+DP/PT pulse bilaterally. Musculoskeletal: No joint deformities, No joint redness or warmth, no limitation of ROM in spin. Skin: No rashes.  Neuro: Alert, oriented X3, cranial nerves II-XII grossly intact, moves all extremities normally.  Psych:  no suicidal or hemocidal ideation. Has hallucination  Labs on Admission: I have personally reviewed following labs and imaging studies  CBC: Recent Labs  Lab 05/17/24 1538  WBC 6.3  HGB 10.4*   HCT 33.1*  MCV 97.6  PLT 191   Basic Metabolic Panel: Recent Labs  Lab 05/17/24 1538 05/17/24 2055  NA 139  --   K 4.7  --   CL 105  --   CO2 24  --   GLUCOSE 86  --   BUN 35*  --   CREATININE 1.57*  --   CALCIUM 8.9  --   MG  --  2.2   GFR: CrCl cannot be calculated (Unknown ideal weight.). Liver Function Tests: Recent Labs  Lab 05/17/24 1538  AST 23  ALT 13  ALKPHOS 87  BILITOT 0.5  PROT 7.5  ALBUMIN 3.1*   No results for input(s): LIPASE, AMYLASE in  the last 168 hours. No results for input(s): AMMONIA in the last 168 hours. Coagulation Profile: No results for input(s): INR, PROTIME in the last 168 hours. Cardiac Enzymes: Recent Labs  Lab 05/17/24 2054  CKTOTAL 48*   BNP (last 3 results) Recent Labs    05/17/24 2014  PROBNP 15,434.0*   HbA1C: No results for input(s): HGBA1C in the last 72 hours. CBG: Recent Labs  Lab 05/17/24 1931  GLUCAP 93   Lipid Profile: No results for input(s): CHOL, HDL, LDLCALC, TRIG, CHOLHDL, LDLDIRECT in the last 72 hours. Thyroid Function Tests: Recent Labs    05/17/24 2054  TSH 8.430*  FREET4 1.07   Anemia Panel: No results for input(s): VITAMINB12, FOLATE, FERRITIN, TIBC, IRON , RETICCTPCT in the last 72 hours. Urine analysis:    Component Value Date/Time   COLORURINE YELLOW (A) 05/17/2024 1539   APPEARANCEUR HAZY (A) 05/17/2024 1539   APPEARANCEUR Clear 02/20/2012 1420   LABSPEC 1.013 05/17/2024 1539   LABSPEC 1.013 02/20/2012 1420   PHURINE 5.0 05/17/2024 1539   GLUCOSEU NEGATIVE 05/17/2024 1539   GLUCOSEU Negative 02/20/2012 1420   HGBUR NEGATIVE 05/17/2024 1539   BILIRUBINUR NEGATIVE 05/17/2024 1539   BILIRUBINUR Negative 02/20/2012 1420   KETONESUR NEGATIVE 05/17/2024 1539   PROTEINUR NEGATIVE 05/17/2024 1539   NITRITE POSITIVE (A) 05/17/2024 1539   LEUKOCYTESUR LARGE (A) 05/17/2024 1539   LEUKOCYTESUR Negative 02/20/2012 1420   Sepsis  Labs: @LABRCNTIP (procalcitonin:4,lacticidven:4) )No results found for this or any previous visit (from the past 240 hours).   Radiological Exams on Admission:   Assessment/Plan Principal Problem:   Atrial fibrillation with slow ventricular response (HCC) Active Problems:   UTI (urinary tract infection)   CAD (coronary artery disease)   Myocardial injury   Chronic diastolic CHF (congestive heart failure) (HCC)   HTN (hypertension)   Chronic kidney disease, stage 3b (HCC)   Pain of left heel and left hip   BPH (benign prostatic hyperplasia)   Iron  deficiency anemia   Hallucination   Hypothermia   Assessment and Plan:  Atrial fibrillation with slow ventricular response (HCC): EKG seems to be in new onset A fib. His HR is 40-60s. CHA2DS2-VASc Score is 5, need oral anticoagulation, but pt seems to have high risk of fall and is easy to bruise. I will defer anticoagulants for now.  Consulted Dr. Dewane of [facility] cardiology   -will admit to PCU as inpt -hold coreg  now - Check TSH and Free T4 level - check Mg level - 2d echo. - TOC consult  UTI (urinary tract infection): - Rocephin  - Follow-up urine culture  CAD (coronary artery disease) and  Myocardial injury: trop 85, No CP.  Likely demand ischemia -ASA 81 mg daily -trend trop - f/u 2d echo  Chronic diastolic CHF (congestive heart failure) (HCC): 2D echo on 06/28/2022 showed EF of 60 to 65%.  Patient does not have SOB, no oxygen desaturation, but patient has 2+ bilateral leg edema and significantly elevated proBNP 15434, and very high risk to develop CHF exacerbation. -Start Lasix  40 mg twice daily -2d echo -Daily weights -strict I/O's -Low salt diet -Fluid restriction -As needed bronchodilators for shortness of breath  HTN (hypertension) -IV hydralazine  as needed - hold hydrochlorothiazide since pt is started on IV lasix   Chronic kidney disease, stage 3b [facility]): Renal function stable.  Recent baseline creatinine was 1.8  on 05/08/2024.  His creatinine is 1.57, BUN 35, GFR 42 -Follow-up with BMP  Pain of left heel and left hip -As needed Percocet,  Tylenol  - As needed Robaxin - Follow-up x-ray of left heel and the left hip - PT/OT  BPH (benign prostatic hyperplasia) -Flomax   Iron  deficiency anemia: Hemoglobin stable 10.4 (9.8 on 05/01/2024). Pt is not taking iron  supplement now. -f/u with CBC  Hallucination: Likely due to UTI.  CT head negative.  No focal neurodeficit on physical examination. - Frequent neurocheck - Fall precaution  Hypothermia: - Bair hugger     DVT ppx: SQ Heparin     Code Status: Full code   Family Communication:     not done, no family member is at bed side.       Disposition Plan:  May need reheb  Consults called:  Dr. Rayfield of [facility] card  Admission status and Level of care: Progressive: as inpt        Dispo: The patient is from: Home              Anticipated d/c is to: may need rehab              Anticipated d/c date is: 2 days              Patient currently is not medically stable to d/c.    Severity of Illness:  The appropriate patient status for this patient is INPATIENT. Inpatient status is judged to be reasonable and necessary in order to provide the required intensity of service to ensure the patient's safety. The patient's presenting symptoms, physical exam findings, and initial radiographic and laboratory data in the context of their chronic comorbidities is felt to place them at high risk for further clinical deterioration. Furthermore, it is not anticipated that the patient will be medically stable for discharge from the hospital within 2 midnights of admission.   * I certify that at the point of admission it is my clinical judgment that the patient will require inpatient hospital care spanning beyond 2 midnights from the point of admission due to high intensity of service, high risk for further deterioration and high frequency of surveillance  required.*       Date of Service 05/17/2024    Jerome [name]   If 7PM-7AM, please contact night-coverage [email or web address] 05/17/2024, 10:42 PM

## 2024-05-17 NOTE — ED Notes (Addendum)
 Patient placed on bair hugger for hypothemia

## 2024-05-17 NOTE — ED Notes (Signed)
 Messaged EDP Tan requesting pain medication for pt left leg chronic pain of 8/10.

## 2024-05-17 NOTE — ED Notes (Signed)
 Attempted to obtain temperature axillary and oral several times. Will inform RN that this tech could not obtain temperature at this time.

## 2024-05-17 NOTE — ED Provider Notes (Signed)
 Jerome [name] Provider Note    Event Date/Time   First MD Initiated Contact with Patient 05/17/24 1705     (approximate)   History   Altered Mental Status   HPI  Jerome [name]. is a 89 y.o. male with history of CHF, hypertension, CKD, here for hallucinations.  States that he has been having increased urinary frequency.  He denies any chest pain or shortness of breath, no new weakness or numbness.  States that he legs sometimes twitches at night.  He denies any new falls.  Per independent history from neighbor, he is needing significantly more help from at home, has been having urinary frequency.  Has been hallucinating for the last 2 weeks.  No reported history of dementia.  Per independent history from EMS, patient son was his POA until he passed away.  His neighbor is his caregiver and has been checking them on him, he lives at home alone.  They reported fall smelling urine.  For EMS he was normotensive, blood glucose was 119.  They were called in because patient was having auditory and visual hallucinations for 2 weeks.    On independent review, exam by primary care in November, has history of chronic CHF, CKD, hypertension, has someone coming to help him at home with his ADLs.   Physical Exam   Triage Vital Signs: ED Triage Vitals [05/17/24 1534]  Encounter Vitals Group     BP 138/79     Girls Systolic BP Percentile      Girls Diastolic BP Percentile      Boys Systolic BP Percentile      Boys Diastolic BP Percentile      Pulse Rate 60     Resp 16     Temp (!) 97.4 F (36.3 C)     Temp Source Oral     SpO2 98 %     Weight      Height 5' 9 (1.753 m)     Head Circumference      Peak Flow      Pain Score      Pain Loc      Pain Education      Exclude from Growth Chart     Most recent vital signs: Vitals:   05/17/24 1534 05/17/24 1945  BP: 138/79 (!) 163/86  Pulse: 60 67  Resp: 16 16  Temp: (!) 97.4 F (36.3 C)   SpO2: 98%  92%     General: Awake, no distress.  CV:  Good peripheral perfusion.  Resp:  Normal effort.  No tachypnea or respiratory distress Abd:  No distention.  Soft nontender Other:  Bilateral lower extremity edema that appears symmetrical, no focal weakness or numbness, no facial droop or slurred speech, he is A and O x 3.   ED Results / Procedures / Treatments   Labs (all labs ordered are listed, but only abnormal results are displayed) Labs Reviewed  COMPREHENSIVE METABOLIC PANEL WITH GFR - Abnormal; Notable for the following components:      Result Value   BUN 35 (*)    Creatinine, Ser 1.57 (*)    Albumin 3.1 (*)    GFR, Estimated 42 (*)    All other components within normal limits  CBC - Abnormal; Notable for the following components:   RBC 3.39 (*)    Hemoglobin 10.4 (*)    HCT 33.1 (*)    RDW 17.0 (*)    All other components within  normal limits  URINALYSIS, ROUTINE W REFLEX MICROSCOPIC - Abnormal; Notable for the following components:   Color, Urine YELLOW (*)    APPearance HAZY (*)    Nitrite POSITIVE (*)    Leukocytes,Ua LARGE (*)    Bacteria, UA MANY (*)    All other components within normal limits  TROPONIN T, HIGH SENSITIVITY - Abnormal; Notable for the following components:   Troponin T High Sensitivity 85 (*)    All other components within normal limits  LACTIC ACID, PLASMA  PRO BRAIN NATRIURETIC PEPTIDE  CBG MONITORING, ED     EKG  EKG shows, atrial fibrillation with slow ventricular response, rate 32, normal QTc, right bundle branch block, T wave flattening to lateral leads, this is new compared to prior   RADIOLOGY On my independent interpretation, CT head without obvious intracranial hemorrhage   PROCEDURES:  Critical Care performed: No  Procedures   MEDICATIONS ORDERED IN ED: Medications  acetaminophen  (TYLENOL ) tablet 650 mg (has no administration in time range)  cefTRIAXone  (ROCEPHIN ) 1 g in sodium chloride  0.9 % 100 mL IVPB (has no  administration in time range)  ondansetron  (ZOFRAN ) injection 4 mg (has no administration in time range)  hydrALAZINE  (APRESOLINE ) injection 5 mg (has no administration in time range)  acetaminophen  (TYLENOL ) tablet 650 mg (has no administration in time range)     IMPRESSION / MDM / ASSESSMENT AND PLAN / ED COURSE  I reviewed the triage vital signs and the nursing notes.                              Differential diagnosis includes, but is not limited to, arrhythmia, dementia, UTI, electrolyte derangements.  Did consider CVA but he has no focal deficits.  Get labs, EKG, UA, CT head.  Patient's presentation is most consistent with acute presentation with potential threat to life or bodily function.  Independent interpretation of labs and imaging below.  EKG shows possible A-fib, he has no prior history of A-fib.  He is not in RVR.  UA is consistent with UTI, prior urine culture shows multiple species present.  Will start him on IV ceftriaxone .  Given the hallucinations, UTI, new A-fib, he will need to be admitted for further management.  Consult the hospitalist who will admit him.  He is admitted.    Clinical Course as of 05/17/24 2046  Sat May 17, 2024  1735 Independent review of labs, electrolytes not severely deranged, creatinine is elevated but downtrending compared to prior, LFTs are not elevated, lactic acid is normal, no leukocytosis. [TT]  1810 CT HEAD WO CONTRAST IMPRESSION: 1. Mildly motion degraded despite repeated imaging attempts. 2. No acute intracranial abnormality.   [TT]  1857 Troponin T High Sensitivity(!): 85 Mildly elevated, could be demand he has no chest pain at this time. [TT]  2016 Urinalysis, Routine w reflex microscopic -Urine, Clean Catch(!) UA is consistent with UTI, will start him on antibiotics. [TT]    Clinical Course User Index [TT] [place], Lorelle Cummins, MD     FINAL CLINICAL IMPRESSION(S) / ED DIAGNOSES   Final diagnoses:  Hallucinations  Urinary  tract infection without hematuria, site unspecified  New onset a-fib (HCC)     Rx / DC Orders   ED Discharge Orders     None        Note:  This document was prepared using Dragon voice recognition software and may include unintentional dictation errors.    Kindle Strohmeier,  Lorelle Cummins, MD 05/17/24 2046

## 2024-05-17 NOTE — ED Notes (Signed)
 Two sets of cultures sent down

## 2024-05-17 NOTE — ED Notes (Signed)
 Caregiver lives next door and tinks pt is unsafe DC home and they would like to either get 24/7 care at home or placement. Per neighbor he is bed bound mostly. Can stand with walker. Pt is scared at home and hallucinating.   Verneita Sharps [phone]

## 2024-05-17 NOTE — ED Triage Notes (Signed)
 Pt to ed from home via [facility] for hearing and seeing things x 1-2 weeks. Pt is alert and oriented. Pt smells like UTI. Lives alone. More aggressive than normal.  Son was POA until he passed away. Neighbors check in on him periodically and here with pt.  59 HR  97 % RA 126/73 119 BGL

## 2024-05-17 NOTE — ED Notes (Signed)
 Pt brief changed by this tech and Andrea, CHARITY FUNDRAISER.

## 2024-05-18 ENCOUNTER — Inpatient Hospital Stay: Admit: 2024-05-18 | Discharge: 2024-05-18 | Disposition: A | Attending: Internal Medicine | Admitting: Internal Medicine

## 2024-05-18 DIAGNOSIS — I5031 Acute diastolic (congestive) heart failure: Secondary | ICD-10-CM

## 2024-05-18 LAB — PROTIME-INR
INR: 1.3 — ABNORMAL HIGH (ref 0.8–1.2)
Prothrombin Time: 16.6 s — ABNORMAL HIGH (ref 11.4–15.2)

## 2024-05-18 LAB — BASIC METABOLIC PANEL WITH GFR
Anion gap: 10 (ref 5–15)
BUN: 34 mg/dL — ABNORMAL HIGH (ref 8–23)
CO2: 25 mmol/L (ref 22–32)
Calcium: 8.7 mg/dL — ABNORMAL LOW (ref 8.9–10.3)
Chloride: 105 mmol/L (ref 98–111)
Creatinine, Ser: 1.57 mg/dL — ABNORMAL HIGH (ref 0.61–1.24)
GFR, Estimated: 42 mL/min — ABNORMAL LOW (ref >60)
Glucose, Bld: 64 mg/dL — ABNORMAL LOW (ref 70–99)
Potassium: 4.3 mmol/L (ref 3.5–5.1)
Sodium: 140 mmol/L (ref 135–145)

## 2024-05-18 LAB — CBG MONITORING, ED: Glucose-Capillary: 115 mg/dL — ABNORMAL HIGH (ref 70–99)

## 2024-05-18 LAB — ECHOCARDIOGRAM COMPLETE
AR max vel: 2.11 cm2
AV Area VTI: 2.2 cm2
AV Area mean vel: 1.99 cm2
AV Mean grad: 3 mmHg
AV Peak grad: 5.7 mmHg
Ao pk vel: 1.19 m/s
Area-P 1/2: 4.15 cm2
Height: 69 in
S' Lateral: 3.1 cm

## 2024-05-18 LAB — CBC
HCT: 30 % — ABNORMAL LOW (ref 39.0–52.0)
Hemoglobin: 9.2 g/dL — ABNORMAL LOW (ref 13.0–17.0)
MCH: 29.9 pg (ref 26.0–34.0)
MCHC: 30.7 g/dL (ref 30.0–36.0)
MCV: 97.4 fL (ref 80.0–100.0)
Platelets: 182 K/uL (ref 150–400)
RBC: 3.08 MIL/uL — ABNORMAL LOW (ref 4.22–5.81)
RDW: 16.8 % — ABNORMAL HIGH (ref 11.5–15.5)
WBC: 5.2 K/uL (ref 4.0–10.5)
nRBC: 0 % (ref 0.0–0.2)

## 2024-05-18 LAB — TROPONIN T, HIGH SENSITIVITY: Troponin T High Sensitivity: 86 ng/L — ABNORMAL HIGH (ref 0–19)

## 2024-05-18 LAB — APTT: aPTT: 36 s (ref 24–36)

## 2024-05-18 MED ORDER — VENLAFAXINE HCL ER 75 MG PO CP24
75.0000 mg | ORAL_CAPSULE | Freq: Every day | ORAL | Status: DC
  Administered 2024-05-18 – 2024-05-23 (×6): 75 mg via ORAL
  Filled 2024-05-18 (×6): qty 1

## 2024-05-18 MED ORDER — APIXABAN 2.5 MG PO TABS
2.5000 mg | ORAL_TABLET | Freq: Two times a day (BID) | ORAL | Status: DC
  Administered 2024-05-19 – 2024-05-23 (×9): 2.5 mg via ORAL
  Filled 2024-05-18 (×10): qty 1

## 2024-05-18 MED ORDER — CARVEDILOL 3.125 MG PO TABS: 3.1250 mg | ORAL_TABLET | Freq: Two times a day (BID) | ORAL | Status: DC

## 2024-05-18 NOTE — Consult Note (Signed)
 [facility] CLINIC CARDIOLOGY CONSULT NOTE       Patient ID: Jerome [name]. MRN: [id number] DOB/AGE: Sep 29, 1934 89 y.o.  Admit date: 05/17/2024 Referring Physician [facility] Primary Physician Diedra Lame, MD Primary Cardiologist NA Reason for Consultation Afib, CHF  HPI: Jerome [name]. is a 89 y.o. male with medical history significant of HTN, CAD, dCHF, gout, depression with anxiety, BPH, CKD-3B, iron  deficiency anemia, who presents with hallucination, increased urinary frequency. He has bilateral lower leg edema. He has some bruises in both arms. Cardiology was placed on consultation for Afib and AECHF.   Patient states he is not having any trouble breathing this morning. He is still relatively confused. He denies chest pain, shortness of breath, palpitations, diaphoresis, syncope.  Review of systems complete and found to be negative unless listed above     Past Medical History:  Diagnosis Date   Anxiety    Arthritis    CAD (coronary artery disease)    Chronic kidney disease    stage 3   Chronic systolic CHF (congestive heart failure) (HCC)    Depression    Hypertension     Past Surgical History:  Procedure Laterality Date   APPENDECTOMY     HERNIA REPAIR Right    inguinal   JOINT REPLACEMENT Right    knee   TOTAL HIP ARTHROPLASTY Right 01/21/2020   Procedure: TOTAL HIP ARTHROPLASTY;  Surgeon: Mardee Lynwood SQUIBB, MD;  Location: [facility] ORS;  Service: Orthopedics;  Laterality: Right;    (Not in a hospital admission)  Social History   Socioeconomic History   Marital status: Divorced    Spouse name: Not on file   Number of children: Not on file   Years of education: Not on file   Highest education level: Not on file  Occupational History   Not on file  Tobacco Use   Smoking status: Never   Smokeless tobacco: Never  Vaping Use   Vaping status: Never Used  Substance and Sexual Activity   Alcohol use: Never   Drug use: Never   Sexual activity: Not  Currently  Other Topics Concern   Not on file  Social History Narrative   Not on file   Social Drivers of Health   Financial Resource Strain: Patient Declined (05/01/2024)   Received from [facility]   Overall Financial Resource Strain (CARDIA)    Difficulty of Paying Living Expenses: Patient declined  Food Insecurity: Patient Declined (05/01/2024)   Received from [facility]   Hunger Vital Sign    Within the past 12 months, you worried that your food would run out before you got the money to buy more.: Patient declined    Within the past 12 months, the food you bought just didn't last and you didn't have money to get more.: Patient declined  Transportation Needs: Patient Declined (05/01/2024)   Received from [facility] - Transportation    In the past 12 months, has lack of transportation kept you from medical appointments or from getting medications?: Patient declined    Lack of Transportation (Non-Medical): Patient declined  Physical Activity: Not on file  Stress: Not on file  Social Connections: Not on file  Intimate Partner Violence: Not At Risk (06/27/2022)   Humiliation, Afraid, Rape, and Kick questionnaire    Fear of Current or Ex-Partner: No    Emotionally Abused: No    Physically Abused: No    Sexually Abused: No  Family History  Problem Relation Age of Onset   Heart failure Son      Vitals:   05/18/24 0719 05/18/24 0730 05/18/24 1048 05/18/24 1100  BP:  117/65  (!) 124/58  Pulse:  (!) 57  (!) 57  Resp:    10  Temp: 98.1 F (36.7 C)  98 F (36.7 C) 98.6 F (37 C)  TempSrc: Oral  Oral Oral  SpO2:  99%  (!) 89%  Height:        PHYSICAL EXAM General: well nourished, in no acute distress. HEENT: Normocephalic and atraumatic. Neck: No JVD.  Lungs: Normal respiratory effort. Clear bilaterally to auscultation. No wheezes, crackles, rhonchi.  Heart: HRRR. Normal S1 and S2 without gallops or  murmurs.  Abdomen: Non-distended appearing.  Msk: Normal strength and tone for age. Extremities: Warm and well perfused. No clubbing, cyanosis. 1+ edema.  Neuro: confused  Labs: Basic Metabolic Panel: Recent Labs    05/17/24 1538 05/17/24 2055 05/18/24 0303  NA 139  --  140  K 4.7  --  4.3  CL 105  --  105  CO2 24  --  25  GLUCOSE 86  --  64*  BUN 35*  --  34*  CREATININE 1.57*  --  1.57*  CALCIUM 8.9  --  8.7*  MG  --  2.2  --    Liver Function Tests: Recent Labs    05/17/24 1538  AST 23  ALT 13  ALKPHOS 87  BILITOT 0.5  PROT 7.5  ALBUMIN 3.1*   No results for input(s): LIPASE, AMYLASE in the last 72 hours. CBC: Recent Labs    05/17/24 1538 05/18/24 0303  WBC 6.3 5.2  HGB 10.4* 9.2*  HCT 33.1* 30.0*  MCV 97.6 97.4  PLT 191 182   Cardiac Enzymes: Recent Labs    05/17/24 2054  CKTOTAL 48*   BNP: No results for input(s): BNP in the last 72 hours. D-Dimer: No results for input(s): DDIMER in the last 72 hours. Hemoglobin A1C: No results for input(s): HGBA1C in the last 72 hours. Fasting Lipid Panel: No results for input(s): CHOL, HDL, LDLCALC, TRIG, CHOLHDL, LDLDIRECT in the last 72 hours. Thyroid Function Tests: Recent Labs    05/17/24 2054  TSH 8.430*   Anemia Panel: No results for input(s): VITAMINB12, FOLATE, FERRITIN, TIBC, IRON , RETICCTPCT in the last 72 hours.   Radiology: DG HIP UNILAT WITH PELVIS 2-3 VIEWS LEFT Result Date: 05/17/2024 CLINICAL DATA:  Pain EXAM: DG HIP (WITH OR WITHOUT PELVIS) 2-3V LEFT COMPARISON:  right hip and pelvis x-ray 01/21/2020 FINDINGS: The bones are diffusely osteopenic. There is no acute fracture or dislocation identified. There severe degenerative changes of the left hip with complete loss of joint space. There is mild sclerosis and mild osteophyte formation. This has progressed compared to 2021. Right hip arthroplasty is grossly within normal limits. Soft tissues are within  normal limits. 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 Electronically Signed   By: Greig Pique M.D.   On: 05/17/2024 22:38   DG Foot Complete Left Result Date: 05/17/2024 CLINICAL DATA:  Heel pain EXAM: LEFT FOOT - COMPLETE 3+ VIEW COMPARISON:  None Available. FINDINGS: The bones are osteopenic. There is no acute fracture or focal osseous lesion. Joint spaces are maintained as can be seen. There is a small plantar calcaneal spur. Peripheral vascular calcifications are present. There is soft tissue swelling of the anterior foot. IMPRESSION: 1. No acute fracture or dislocation.  2. Soft tissue swelling of the anterior foot. Electronically Signed   By: Greig Pique M.D.   On: 05/17/2024 22:36   CT HEAD WO CONTRAST Result Date: 05/17/2024 EXAM: CT HEAD WITHOUT CONTRAST 05/17/2024 05:42:41 PM TECHNIQUE: CT of the head was performed without the administration of intravenous contrast. Automated exposure control, iterative reconstruction, and/or weight based adjustment of the mA/kV was utilized to reduce the radiation dose to as low as reasonably achievable. COMPARISON: Prior head CT 06/26/2022. CLINICAL HISTORY: 89 year old male with acute neuro deficit, stroke suspected. FINDINGS: Mild motion artifact despite repeated imaging attempts. BRAIN AND VENTRICLES: Brain volume stable from last year and normal for age. No acute hemorrhage. No evidence of acute infarct. No hydrocephalus. No extra-axial collection. No mass effect or midline shift. Gray white differentiation stable and within normal limits for age. Mild motion artifact despite repeated imaging attempts. ORBITS: No acute abnormality. SINUSES: Visible paranasal sinuses, middle ears and mastoids remain clear. SOFT TISSUES AND SKULL: No acute soft tissue abnormality. No skull fracture. Mild for age calcified atherosclerosis at the skull base. IMPRESSION: 1. Mildly motion degraded despite repeated imaging attempts.  2. No acute intracranial abnormality. Electronically signed by: Helayne Hurst MD 05/17/2024 06:02 PM EST RP Workstation: [id number]    ECHO pending  TELEMETRY reviewed by me ([facility]) 05/18/2024 : Afib SVR  EKG reviewed by me: Afib SVR  Data reviewed by me [facility]) 05/18/2024: last 24h vitals tele labs imaging I/O provider notes  Principal Problem:   Atrial fibrillation with slow ventricular response (HCC) Active Problems:   Acute exacerbation of CHF (congestive heart failure) (HCC)   CAD (coronary artery disease)   HTN (hypertension)   Chronic diastolic CHF (congestive heart failure) (HCC)   Iron  deficiency anemia   Myocardial injury   UTI (urinary tract infection)   Hallucination   Chronic kidney disease, stage 3b (HCC)   Pain of left heel and left hip   BPH (benign prostatic hyperplasia)   Hypothermia    ASSESSMENT AND PLAN:   Afib with slow ventricular response AECHF Maintain on telemetry. Strict I&Os, daily weights. Cardiac diet Optimize lytes, K>4 and Mag>2. Hold AV nodal blocking agents. Currently not requiring anything for rate control. Eliquis dosing by pharmacy for Afib Echocardiogram pending. Continue diuresis with IV lasix . Troponin mildly elevated, not consistent with ACS.   Signed: Anh Mangano, DO 05/18/2024, 12:52 PM [facility]

## 2024-05-18 NOTE — Evaluation (Signed)
 Physical Therapy Evaluation Patient Details Name: Jerome [name]. MRN: [id number] DOB: March 15, 1935 Today's Date: 05/18/2024  History of Present Illness  Jerome [name]. is a 89 y.o. male with medical history significant of HTN, CAD, dCHF, gout, depression with anxiety, BPH, CKD-3B, iron  deficiency anemia, who presents with hallucination, increased urinary frequency, pain in left heel and the left hip.  Clinical Impression  Patient received in bed, just finishing lunch. He is agreeable to PT assessment. Patient reports he lives with grandson and has aide as much as he needs her. Patient is w/c bound at baseline and requires assistance with all mobility and ADLs prior to admission. He is limited by generalized weakness and pain in L LE. He also has limited ROM in B UEs. Patient will continue to benefit from skilled PT to improve functional mobility and independence.          If plan is discharge home, recommend the following: Two people to help with walking and/or transfers;A lot of help with bathing/dressing/bathroom   Can travel by private vehicle   No    Equipment Recommendations None recommended by PT  Recommendations for Other Services       Functional Status Assessment Patient has had a recent decline in their functional status and demonstrates the ability to make significant improvements in function in a reasonable and predictable amount of time.     Precautions / Restrictions Precautions Precautions: Fall Restrictions Weight Bearing Restrictions Per Provider Order: No      Mobility  Bed Mobility Overal bed mobility: Needs Assistance Bed Mobility: Rolling, Supine to Sit, Sit to Supine Rolling: Mod assist   Supine to sit: Max assist, Used rails, HOB elevated Sit to supine: Max assist   General bed mobility comments: patient requires significant assist for bed mobility due to weakness and L LE pain ( has limited use of B Ues due to OA also, weak and  limited ROM)    Transfers                   General transfer comment: not attempted this session    Ambulation/Gait               General Gait Details: not ambulatory at baseline  Stairs            Wheelchair Mobility     Tilt Bed    Modified Rankin (Stroke Patients Only)       Balance Overall balance assessment: Needs assistance Sitting-balance support: Feet supported Sitting balance-Leahy Scale: Poor                                       Pertinent Vitals/Pain Pain Assessment Pain Assessment: Faces Faces Pain Scale: Hurts even more Pain Location: L LE Pain Descriptors / Indicators: Discomfort, Sore Pain Intervention(s): Monitored during session, Repositioned    Home Living Family/patient expects to be discharged to:: Private residence Living Arrangements: Children Available Help at Discharge: Personal care attendant;Available PRN/intermittently Type of Home: House Home Access: Ramped entrance       Home Layout: One level Home Equipment: Wheelchair - manual Additional Comments: patient reports he has caregiver  as much as I need her and grandson to assist him    Prior Function Prior Level of Function : Needs assist       Physical Assist : Mobility (physical);ADLs (physical)     Mobility Comments: needs  assistance with bed mobility and transfers at baseline ADLs Comments: needs assistance dressing and bathing at baseline     Extremity/Trunk Assessment   Upper Extremity Assessment Upper Extremity Assessment: Defer to OT evaluation    Lower Extremity Assessment Lower Extremity Assessment: Generalized weakness;LLE deficits/detail;RLE deficits/detail RLE Coordination: decreased gross motor LLE: Unable to fully assess due to pain LLE Coordination: decreased gross motor       Communication   Communication Communication: No apparent difficulties    Cognition Arousal: Alert Behavior During Therapy: WFL for  tasks assessed/performed   PT - Cognitive impairments: No apparent impairments                         Following commands: Intact       Cueing Cueing Techniques: Verbal cues     General Comments      Exercises     Assessment/Plan    PT Assessment Patient needs continued PT services  PT Problem List Decreased strength;Decreased range of motion;Decreased activity tolerance;Decreased balance;Decreased mobility;Decreased coordination;Decreased cognition;Decreased knowledge of use of DME;Decreased safety awareness;Pain;Decreased skin integrity;Obesity       PT Treatment Interventions Functional mobility training;Therapeutic activities;Therapeutic exercise;Balance training;Neuromuscular re-education;Patient/family education    PT Goals (Current goals can be found in the Care Plan section)  Acute Rehab PT Goals Patient Stated Goal: patient open to rehab PT Goal Formulation: With patient Time For Goal Achievement: 06/01/24 Potential to Achieve Goals: Good    Frequency Min 2X/week     Co-evaluation               AM-PAC PT 6 Clicks Mobility  Outcome Measure Help needed turning from your back to your side while in a flat bed without using bedrails?: A Lot Help needed moving from lying on your back to sitting on the side of a flat bed without using bedrails?: A Lot Help needed moving to and from a bed to a chair (including a wheelchair)?: Total Help needed standing up from a chair using your arms (e.g., wheelchair or bedside chair)?: Total Help needed to walk in hospital room?: Total Help needed climbing 3-5 steps with a railing? : Total 6 Click Score: 8    End of Session   Activity Tolerance: Patient limited by fatigue;Patient limited by pain Patient left: in bed;with call bell/phone within reach;with bed alarm set Nurse Communication: Mobility status PT Visit Diagnosis: Other abnormalities of gait and mobility (R26.89);Muscle weakness (generalized)  (M62.81);Unsteadiness on feet (R26.81);Pain Pain - Right/Left: Left Pain - part of body: Leg    Time: 1400-1409 PT Time Calculation (min) (ACUTE ONLY): 9 min   Charges:   PT Evaluation $PT Eval Low Complexity: 1 Low   PT General Charges $$ ACUTE PT VISIT: 1 Visit         Lauramae Kneisley, PT, GCS 05/18/24,2:24 PM

## 2024-05-18 NOTE — ED Notes (Signed)
 This RN encountered patient to administer antibiotics, lasix , and eliquis which he refused despite education and importance. MD Mansy notified.

## 2024-05-18 NOTE — ED Notes (Signed)
 This RN noted blood glucose level 64 mg/dL on morning labs. Pt awakened and given 4 oz of orange juice, graham crackers w/ peanut butter. Pt repositioned in bed and feet floated on pillow.

## 2024-05-18 NOTE — ED Notes (Signed)
 Echo at bedside

## 2024-05-18 NOTE — Progress Notes (Addendum)
 PROGRESS NOTE    Jerome [name].  FMW:[id number] DOB: 15-May-1935 DOA: 05/17/2024 PCP: Diedra Lame, MD  Outpatient Specialists: none    Brief Narrative:   From admission h and p  Jerome [name]. is a 89 y.o. male with medical history significant of HTN, CAD, dCHF, gout, depression with anxiety, BPH, CKD-3B, iron  deficiency anemia, who presents with hallucination, increased urinary frequency, pain in left heel and the left hip.   Per report, pt has hallucination for almost 2 weeks. He sees things and hears things which are not there.  Patient is oriented x 3 during the interview.  Patient complaints of pain in whole left leg, but on my examination, patient seems to have pain in left heel and left hip. He denies any fall or injury.  Patient does not have chest pain, cough, SOB.  No nausea, vomiting, diarrhea or abdominal pain.  He reports increased urinary frequency. He denies dysuria or burning on urination.  No hematuria.  No fever or chills. He has bilateral lower leg edema. He has some bruises in both arms.    Per report, pt lives alone. His son was POA until he passed away. Neighbors check in on him periodically. Per ED RN's report, neighbor reported that pt is bed bound mostly, and can stand with walker. Pt is scared at home    Assessment & Plan:   Principal Problem:   Atrial fibrillation with slow ventricular response (HCC) Active Problems:   UTI (urinary tract infection)   CAD (coronary artery disease)   Myocardial injury   Chronic diastolic CHF (congestive heart failure) (HCC)   HTN (hypertension)   Chronic kidney disease, stage 3b (HCC)   Pain of left heel and left hip   BPH (benign prostatic hyperplasia)   Iron  deficiency anemia   Hallucination   Hypothermia   Acute exacerbation of CHF (congestive heart failure) (HCC)  # Acute cystitis No signs/symptoms ascending infection. Endorses frequency, dysuria, and recent hallucinations. Bladder scan today  no retention. - continue rocephin  - f/u culture  # A-fib  New diagnosis. Not hyperthyroid - cardiology following, recs appreciated - rate controlled currently in the 50-60s, cardiology advising hold home coreg  - chads2vasc is elevated, apixaban initiated - f/u tte - diurese as below  # Hallucinations None currently, likely 2/2 uti and baseline dementia  # HFpEF Mild LE edema, markedly elevated bnp - f/u TTE - continue lasix  40 bid - I/os  # Debility Mainly bedbound at baseline, neighbor helps care for him. Not a safe situation - pt consult - toc consult - palliative consult  # CKD 3b Kidney function at baseline - monitor  # HTN Normotensive - holding home hydrochlorothiazide while on lasix   # left heel pain Stage 1 pressure ulcer. X-ray neg  # BPH Bladder scan negative - home flomax   # MDD - home effexor   DVT prophylaxis: heparin  Code Status: full Family Communication: friend teresa smith at bedside, patient reports he would want her to be medical power of attorney (will consult chaplain for that)  Level of care: Progressive Status is: Inpatient Remains inpatient appropriate because: severity of illness    Consultants:  cardiology  Procedures: none  Antimicrobials:  ceftriaxone     Subjective: Reports feeling fine, no abd or flank pain, no fevers, stable chronic left knee and heel pain  Objective: Vitals:   05/18/24 0530 05/18/24 0630 05/18/24 0719 05/18/24 0730  BP: (!) 113/59 129/68  117/65  Pulse: (!) 53 62  (!)  57  Resp: 12 15    Temp:   98.1 F (36.7 C)   TempSrc:   Oral   SpO2: 94% 96%  99%  Height:        Intake/Output Summary (Last 24 hours) at 05/18/2024 1030 Last data filed at 05/18/2024 0824 Gross per 24 hour  Intake 100 ml  Output 800 ml  Net -700 ml   There were no vitals filed for this visit.  Examination:  General exam: Appears calm and comfortable  Respiratory system: Clear to auscultation. Respiratory  effort normal. Cardiovascular system: S1 & S2 heard, irreg, soft systolic murmur Gastrointestinal system: Abdomen is obese, soft and nontender.   Central nervous system: Alert and oriented to self, place Extremities: warm, trace LE edema, erythema left heeel Skin: bruising on extremities Psychiatry: calm    Data Reviewed: I have personally reviewed following labs and imaging studies  CBC: Recent Labs  Lab 05/17/24 1538 05/18/24 0303  WBC 6.3 5.2  HGB 10.4* 9.2*  HCT 33.1* 30.0*  MCV 97.6 97.4  PLT 191 182   Basic Metabolic Panel: Recent Labs  Lab 05/17/24 1538 05/17/24 2055 05/18/24 0303  NA 139  --  140  K 4.7  --  4.3  CL 105  --  105  CO2 24  --  25  GLUCOSE 86  --  64*  BUN 35*  --  34*  CREATININE 1.57*  --  1.57*  CALCIUM 8.9  --  8.7*  MG  --  2.2  --    GFR: CrCl cannot be calculated (Unknown ideal weight.). Liver Function Tests: Recent Labs  Lab 05/17/24 1538  AST 23  ALT 13  ALKPHOS 87  BILITOT 0.5  PROT 7.5  ALBUMIN 3.1*   No results for input(s): LIPASE, AMYLASE in the last 168 hours. No results for input(s): AMMONIA in the last 168 hours. Coagulation Profile: Recent Labs  Lab 05/18/24 0303  INR 1.3*   Cardiac Enzymes: Recent Labs  Lab 05/17/24 2054  CKTOTAL 48*   BNP (last 3 results) Recent Labs    05/17/24 2014  PROBNP 15,434.0*   HbA1C: No results for input(s): HGBA1C in the last 72 hours. CBG: Recent Labs  Lab 05/17/24 1931 05/18/24 0444  GLUCAP 93 115*   Lipid Profile: No results for input(s): CHOL, HDL, LDLCALC, TRIG, CHOLHDL, LDLDIRECT in the last 72 hours. Thyroid Function Tests: Recent Labs    05/17/24 2054  TSH 8.430*  FREET4 1.07   Anemia Panel: No results for input(s): VITAMINB12, FOLATE, FERRITIN, TIBC, IRON , RETICCTPCT in the last 72 hours. Urine analysis:    Component Value Date/Time   COLORURINE YELLOW (A) 05/17/2024 1539   APPEARANCEUR HAZY (A) 05/17/2024 1539    APPEARANCEUR Clear 02/20/2012 1420   LABSPEC 1.013 05/17/2024 1539   LABSPEC 1.013 02/20/2012 1420   PHURINE 5.0 05/17/2024 1539   GLUCOSEU NEGATIVE 05/17/2024 1539   GLUCOSEU Negative 02/20/2012 1420   HGBUR NEGATIVE 05/17/2024 1539   BILIRUBINUR NEGATIVE 05/17/2024 1539   BILIRUBINUR Negative 02/20/2012 1420   KETONESUR NEGATIVE 05/17/2024 1539   PROTEINUR NEGATIVE 05/17/2024 1539   NITRITE POSITIVE (A) 05/17/2024 1539   LEUKOCYTESUR LARGE (A) 05/17/2024 1539   LEUKOCYTESUR Negative 02/20/2012 1420   Sepsis Labs: @LABRCNTIP (procalcitonin:4,lacticidven:4)  )No results found for this or any previous visit (from the past 240 hours).       Radiology Studies: DG HIP UNILAT WITH PELVIS 2-3 VIEWS LEFT Result Date: 05/17/2024 CLINICAL DATA:  Pain EXAM: DG HIP (WITH OR  WITHOUT PELVIS) 2-3V LEFT COMPARISON:  right hip and pelvis x-ray 01/21/2020 FINDINGS: The bones are diffusely osteopenic. There is no acute fracture or dislocation identified. There severe degenerative changes of the left hip with complete loss of joint space. There is mild sclerosis and mild osteophyte formation. This has progressed compared to 2021. Right hip arthroplasty is grossly within normal limits. Soft tissues are within normal limits. 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 Electronically Signed   By: Greig Pique M.D.   On: 05/17/2024 22:38   DG Foot Complete Left Result Date: 05/17/2024 CLINICAL DATA:  Heel pain EXAM: LEFT FOOT - COMPLETE 3+ VIEW COMPARISON:  None Available. FINDINGS: The bones are osteopenic. There is no acute fracture or focal osseous lesion. Joint spaces are maintained as can be seen. There is a small plantar calcaneal spur. Peripheral vascular calcifications are present. There is soft tissue swelling of the anterior foot. IMPRESSION: 1. No acute fracture or dislocation. 2. Soft tissue swelling of the anterior foot. Electronically Signed   By:  Greig Pique M.D.   On: 05/17/2024 22:36   CT HEAD WO CONTRAST Result Date: 05/17/2024 EXAM: CT HEAD WITHOUT CONTRAST 05/17/2024 05:42:41 PM TECHNIQUE: CT of the head was performed without the administration of intravenous contrast. Automated exposure control, iterative reconstruction, and/or weight based adjustment of the mA/kV was utilized to reduce the radiation dose to as low as reasonably achievable. COMPARISON: Prior head CT 06/26/2022. CLINICAL HISTORY: 89 year old male with acute neuro deficit, stroke suspected. FINDINGS: Mild motion artifact despite repeated imaging attempts. BRAIN AND VENTRICLES: Brain volume stable from last year and normal for age. No acute hemorrhage. No evidence of acute infarct. No hydrocephalus. No extra-axial collection. No mass effect or midline shift. Gray white differentiation stable and within normal limits for age. Mild motion artifact despite repeated imaging attempts. ORBITS: No acute abnormality. SINUSES: Visible paranasal sinuses, middle ears and mastoids remain clear. SOFT TISSUES AND SKULL: No acute soft tissue abnormality. No skull fracture. Mild for age calcified atherosclerosis at the skull base. IMPRESSION: 1. Mildly motion degraded despite repeated imaging attempts. 2. No acute intracranial abnormality. Electronically signed by: Helayne Hurst MD 05/17/2024 06:02 PM EST RP Workstation: [id number]        Scheduled Meds:  aspirin  EC  81 mg Oral Daily   furosemide   40 mg Intravenous Q12H   heparin   5,000 Units Subcutaneous Q8H   tamsulosin   0.4 mg Oral q1800   Continuous Infusions:  cefTRIAXone  (ROCEPHIN )  IV       LOS: 1 day     Devaughn KATHEE Ban, MD [facility]   If 7PM-7AM, please contact night-coverage [email or web address] Password [email or web address] 05/18/2024, 10:30 AM

## 2024-05-18 NOTE — Progress Notes (Signed)
   05/18/24 1335  Spiritual Encounters  Type of Visit Initial  Care provided to: Patient  Conversation partners present during encounter Nurse  Referral source Clinical staff  Reason for visit Advance directives  OnCall Visit Yes  Advance Directives (For Healthcare)  Does Patient Have a Medical Advance Directive? No  Would patient like information on creating a medical advance directive? Yes (Inpatient - patient defers creating a medical advance directive at this time - Information given)   Chaplain provided AD paperwork to patient.  Patient stated he wanted to wait until his attorney and caregiver arrive before completing.

## 2024-05-18 NOTE — ED Notes (Signed)
 Granddaughter given update via phone.

## 2024-05-18 NOTE — ED Notes (Signed)
 Cardiology at the bedside.

## 2024-05-19 ENCOUNTER — Other Ambulatory Visit (HOSPITAL_COMMUNITY): Payer: Self-pay

## 2024-05-19 DIAGNOSIS — Z7189 Other specified counseling: Secondary | ICD-10-CM | POA: Diagnosis not present

## 2024-05-19 DIAGNOSIS — I4891 Unspecified atrial fibrillation: Secondary | ICD-10-CM | POA: Diagnosis not present

## 2024-05-19 LAB — BASIC METABOLIC PANEL WITH GFR
Anion gap: 9 (ref 5–15)
BUN: 34 mg/dL — ABNORMAL HIGH (ref 8–23)
CO2: 29 mmol/L (ref 22–32)
Calcium: 8.5 mg/dL — ABNORMAL LOW (ref 8.9–10.3)
Chloride: 101 mmol/L (ref 98–111)
Creatinine, Ser: 1.75 mg/dL — ABNORMAL HIGH (ref 0.61–1.24)
GFR, Estimated: 37 mL/min — ABNORMAL LOW (ref >60)
Glucose, Bld: 74 mg/dL (ref 70–99)
Potassium: 4.4 mmol/L (ref 3.5–5.1)
Sodium: 139 mmol/L (ref 135–145)

## 2024-05-19 LAB — URINE CULTURE

## 2024-05-19 NOTE — Consult Note (Signed)
 Consultation Note Date: 05/19/2024   Patient Name: Jerome Jerome [name].  DOB: 01-13-1935  MRN: [id number]  Age / Sex: 89 y.o., male  PCP: Diedra Lame, MD Referring Physician: Dino Antu, MD  Reason for Consultation: Establishing goals of care  HPI/Patient Profile:  Jerome Jerome [name]. is a 89 y.o. male with medical history significant of HTN, CAD, dCHF, gout, depression with anxiety, BPH, CKD-3B, iron  deficiency anemia, who presents with hallucination, increased urinary frequency, pain in left heel and the left hip.   Clinical Assessment and Goals of Care: Notes and labs reviewed.  In to Jerome [name] patient.  He is currently resting in bed at this time, no family at bedside.  He discusses that he lives alone at baseline.  He he states he is able to stand with a walker, and walks to the door every day to meet the Meals on Wheels person that brings his food.  He states he has a person that comes in 2 times a week for couple of hours to spend time with him.  He states he has a neighbor that lives nearby that comes and spends most of the day with him.  He states the person cooks and cleans, and at times helps with ADLs.  He discusses having had a right hip replacement, and surgery on his right knee.  He states he has left knee and hip pain that he has talked with his doctor about, but has not been able to get a referral to have this further assessed.  He states this pain is the reason that he is mostly bed and chair bound.  He states he has heel pain from trying to push himself up in bed.  He states he also has right hand numbness where he can somewhat hold utensils, but has significant issues with this.  He discusses that he had left carpal tunnel release years ago, and needs this on the right.  He discusses significant family dynamics.  He discusses that he has a grandson.  He states his son died in 08-30-23.   He states his son's significant other does not like him, and has affected the relationship he has with his grandson.  We discussed his diagnosis, prognosis, GOC, EOL wishes disposition and options.  Created space and opportunity for patient  to explore thoughts and feelings regarding current medical information.   A detailed discussion was had today regarding advanced directives.  Concepts specific to code status, artifical feeding and hydration, IV antibiotics and rehospitalization were discussed.  The difference between an aggressive medical intervention path and a comfort care path was discussed.  Values and goals of care important to patient and family were attempted to be elicited.  Discussed limitations of medical interventions to prolong quality of life in some situations and discussed the concept of human mortality.  He states he is a man of great faith.  He states he is no longer able to go to church, but enjoys watching Jerome Jerome [name] and [name].  We discussed  different scenarios regarding his care.  Patient discusses that when the Lord calls him home he is ready, from a salvation standpoint; but he wants to do all he can to stay on this earth until that time.  Offered to have spiritual care assist with H POA documents.  He states he is not interested in completing these at this time as he is not sure if he would want his neighbor or his pastor to be his healthcare power of attorney.  He states he is sure he would not want his grandson to do it, again stating that his mother has affected their relationship and he feels she would affect the decisions made regarding his care.     SUMMARY OF RECOMMENDATIONS   Continue full code full scope      Primary Diagnoses: Present on Admission:  Myocardial injury  UTI (urinary tract infection)  CAD (coronary artery disease)  Iron  deficiency anemia  Chronic diastolic CHF (congestive heart failure) (HCC)  HTN (hypertension)   Hallucination  Chronic kidney disease, stage 3b (HCC)  Pain of left heel and left hip  Atrial fibrillation with slow ventricular response (HCC)  BPH (benign prostatic hyperplasia)  Hypothermia   I have reviewed the medical record, interviewed the patient and family, and examined the patient. The following aspects are pertinent.  Past Medical History:  Diagnosis Date   Anxiety    Arthritis    CAD (coronary artery disease)    Chronic kidney disease    stage 3   Chronic systolic CHF (congestive heart failure) (HCC)    Depression    Hypertension    Social History   Socioeconomic History   Marital status: Divorced    Spouse name: Not on file   Number of children: Not on file   Years of education: Not on file   Highest education level: Not on file  Occupational History   Not on file  Tobacco Use   Smoking status: Never   Smokeless tobacco: Never  Vaping Use   Vaping status: Never Used  Substance and Sexual Activity   Alcohol use: Never   Drug use: Never   Sexual activity: Not Currently  Other Topics Concern   Not on file  Social History Narrative   Not on file   Social Drivers of Health   Financial Resource Strain: Patient Declined (05/01/2024)   Received from [facility]   Overall Financial Resource Strain (CARDIA)    Difficulty of Paying Living Expenses: Patient declined  Food Insecurity: Patient Declined (05/01/2024)   [facility]   Hunger Vital Sign    Within the past 12 months, you worried that your food would run out before you got the money to buy more.: Patient declined    Within the past 12 months, the food you bought just didn't last and you didn't have money to get more.: Patient declined  Transportation Needs: Patient Declined (05/01/2024)   Received from [facility] - Transportation    In the past 12 months, has lack of transportation kept you from medical appointments or  from getting medications?: Patient declined    Lack of Transportation (Non-Medical): Patient declined  Physical Activity: Not on file  Stress: Not on file  Social Connections: Not on file   Family History  Problem Relation Age of Onset   Heart failure Son    Scheduled Meds:  apixaban  2.5 mg Oral BID   furosemide   40  mg Intravenous Q12H   tamsulosin   0.4 mg Oral q1800   venlafaxine  XR  75 mg Oral Daily   Continuous Infusions:  cefTRIAXone  (ROCEPHIN )  IV     PRN Meds:.acetaminophen , hydrALAZINE , methocarbamol , ondansetron  (ZOFRAN ) IV Medications Prior to Admission:  Prior to Admission medications   Medication Sig Start Date End Date Taking? Authorizing Provider  Ascorbic Acid  (VITAMIN C  WITH ROSE HIPS) 500 MG tablet Take 500 mg by mouth in the morning and at bedtime.   Yes [provider]  aspirin  EC 81 MG tablet Take 81 mg by mouth daily.   Yes [provider]  carvedilol  (COREG ) 6.25 MG tablet Take 1 tablet (6.25 mg total) by mouth 2 (two) times daily with a meal. 12/12/18  Yes Patel, Sona, MD  hydrochlorothiazide (HYDRODIURIL) 12.5 MG tablet Take 12.5 mg by mouth daily. 05/02/24 05/02/25 Yes [provider]  oxybutynin (DITROPAN) 5 MG tablet Take 5 mg by mouth at bedtime. 05/01/24 05/01/25 Yes [provider]  polyethylene glycol powder ([facility] PURELAX) 17 GM/SCOOP powder Take 17 g by mouth daily as needed for mild constipation. MIX 17 GRAMS IN 4-8 OUNCES OF FLUID & TAKE DAILY AS NEEDED FOR CONSTIPATION FOR UP TO 30 DAYS 12/25/19  Yes [provider]  tamsulosin  (FLOMAX ) 0.4 MG CAPS capsule Take 0.4 mg by mouth daily at 6 PM.    Yes [provider]  venlafaxine  XR (EFFEXOR -XR) 75 MG 24 hr capsule Take 75 mg by mouth daily. 04/16/24  Yes [provider]  vitamin B-12 (CYANOCOBALAMIN ) 1000 MCG tablet Take 1,000 mcg by mouth daily.   Yes [provider]  amLODipine  (NORVASC ) 2.5 MG tablet Take 2.5 mg by mouth  daily. Patient not taking: Reported on 05/17/2024 02/08/24   [provider]  amLODipine  (NORVASC ) 5 MG tablet Take 5 mg by mouth daily. Patient not taking: Reported on 05/17/2024    [provider]  iron  polysaccharides (NIFEREX) 150 MG capsule Take 1 capsule (150 mg total) by mouth daily. Patient not taking: Reported on 05/17/2024 07/02/22   Laurita Pillion, MD  Multiple Vitamins-Minerals (MULTIVITAMIN WITH MINERALS) tablet Take 1 tablet by mouth daily. Patient not taking: Reported on 05/17/2024    [provider]   Allergies  Allergen Reactions   Gabapentin  Other (Jerome [name] Comments)    hypotension   Review of Systems  All other systems reviewed and are negative.   Physical Exam Pulmonary:     Effort: Pulmonary effort is normal.  Skin:    General: Skin is warm and dry.  Neurological:     Mental Status: He is alert.     Vital Signs: BP 137/75 (BP Location: Right Arm)   Pulse 66   Temp 98.5 F (36.9 C) (Oral)   Resp 18   Ht 5' 9 (1.753 m)   SpO2 97%   BMI 28.65 kg/m  Pain Scale: 0-10   Pain Score: Asleep   SpO2: SpO2: 97 % O2 Device:SpO2: 97 % O2 Flow Rate: .   IO: Intake/output summary:  Intake/Output Summary (Last 24 hours) at 05/19/2024 1237 Last data filed at 05/19/2024 0300 Gross per 24 hour  Intake --  Output 2300 ml  Net -2300 ml    LBM:   Baseline Weight:   Most recent weight:        Signed by: Camelia Lewis, NP   Please contact Palliative Medicine Team phone at [phone] for questions and concerns.  For individual provider: See Tracey

## 2024-05-19 NOTE — Progress Notes (Signed)
 Heart Failure Navigator Progress Note  Assessed for Heart & Vascular TOC clinic readiness.  Patient does not meet criteria due to current [facility] Cardiology consult.  Navigator will sign off at this time.  Charmaine Pines, RN, BSN [facility] Heart Failure Navigator Secure Chat Only

## 2024-05-19 NOTE — TOC Initial Note (Signed)
 Transition of Care (TOC) - Initial/Assessment Note    Patient Details  Name: Jerome [name]. MRN: [id number] Date of Birth: [birth year]  Transition of Care [facility]) CM/SW Contact:    Delphine KANDICE Bring, RN Phone Number: 05/19/2024, 3:58 PM  Clinical Narrative:                 Patient states he lives alone but have a  Caregiver, Zebedee Sharps 7 days/ week that assist in his care. He states that his PCP is Dr. Diedra and [facility]  (sometimes grandson will pick up or he has medications delivered to the home.). Patient states that he uses [place] [facility] transportation for [facility] appointments.   Patient states that he has PT coming to the home but not sure the name of the company. Patient states that he is in a wheelchair but he can stan and pivot prior to admission. Patient states that he has a walker, hospital bed and BSC at home.  At time of assess this morning therapy had not seen patient. CM asked if they recommend SNF was in agreement with placement. Patient states that he has been to [facility] and [facility] in the past. He does not want to go back to [facility]. He would like [facility]   Expected Discharge Plan: Skilled Nursing Facility     Patient Goals and CMS Choice            Expected Discharge Plan and Services       Living arrangements for the past 2 months: Single Family Home                                      Prior Living Arrangements/Services Living arrangements for the past 2 months: Single Family Home Lives with:: Self Patient language and need for interpreter reviewed:: No (English) Do you feel safe going back to the place where you live?: Yes          Current home services: DME, Home PT, Meals on wheels    Activities of Daily Living      Permission Sought/Granted                  Emotional Assessment Appearance:: Appears stated age Attitude/Demeanor/Rapport:  (Cooperative) Affect (typically observed): Accepting, Appropriate,  Calm Orientation: : Oriented to Self, Oriented to Place, Oriented to  Time, Oriented to Situation      Admission diagnosis:  New onset atrial fibrillation (HCC) [I48.91] Atrial fibrillation with slow ventricular response (HCC) [I48.91] Patient Active Problem List   Diagnosis Date Noted   New onset atrial fibrillation (HCC) 05/17/2024   Myocardial injury 05/17/2024   UTI (urinary tract infection) 05/17/2024   Hallucination 05/17/2024   Chronic kidney disease, stage 3b (HCC) 05/17/2024   Pain of left heel and left hip 05/17/2024   Atrial fibrillation with slow ventricular response (HCC) 05/17/2024   BPH (benign prostatic hyperplasia) 05/17/2024   Hypothermia 05/17/2024   Iron  deficiency anemia 06/30/2022   Pressure ulcer of left heel, unstageable (HCC) 06/29/2022   Chronic diastolic CHF (congestive heart failure) (HCC) 06/29/2022   Anemia of chronic disease 06/29/2022   Hyponatremia 06/28/2022   Pressure injury of skin 06/28/2022   Overweight 06/28/2022   Syncope due to orthostatic hypotension 06/28/2022   Syncope 06/26/2022   Hx of total hip arthroplasty, right 01/21/2020   Sepsis (HCC) 03/20/2019   CAD (coronary artery disease) 03/20/2019  HTN (hypertension) 03/20/2019   Acute on chronic renal failure 03/20/2019   Severe sepsis (HCC) 03/20/2019   Right leg pain 12/13/2018   Acute exacerbation of CHF (congestive heart failure) (HCC) 12/09/2018   Benign essential hypertension 01/12/2014   Gout 01/12/2014   PCP:  Diedra Lame, MD Pharmacy[phone] [place] [place] - [address] [place] [address] Phone: [phone] Fax: [phone]     Social Drivers of Health (SDOH) Social History: SDOH Screenings   Food Insecurity: Patient Declined (05/01/2024)   Received [facility]  Housing: Patient Declined (05/01/2024)   Received from [facility]  Transportation Needs: Patient Declined (05/01/2024)    Received from [facility]  Utilities: Patient Declined (05/01/2024)   Received from [facility]  Financial Resource Strain: Patient Declined (05/01/2024)   Received from [facility]  Tobacco Use: Low Risk  (05/17/2024)   SDOH Interventions:     Readmission Risk Interventions     No data to display

## 2024-05-19 NOTE — Progress Notes (Signed)
 [facility] CLINIC CARDIOLOGY PROGRESS NOTE       Patient ID: Jerome [name]. MRN: [id number] DOB/AGE: [birth year] 89 y.o.  Admit date: 05/17/2024 Referring Physician [facility] Primary Physician Diedra Lame, MD Primary Cardiologist NA Reason for Consultation Afib, CHF  HPI: Jerome [name]. is a 89 y.o. male with medical history significant of HTN, CAD, dCHF, gout, depression with anxiety, BPH, CKD-3B, iron  deficiency anemia, who presents with hallucination, increased urinary frequency. Cardiology was placed on consultation for Afib and AoCHF.   Interval history: -Patient seen and examined this AM, resting in bed with caregiver at bedside.  -Denies dizziness/lightheadedness. HR controlled.  -Denies SOB. Still with significant LE edema R > L.   Review of systems complete and found to be negative unless listed above     Past Medical History:  Diagnosis Date   Anxiety    Arthritis    CAD (coronary artery disease)    Chronic kidney disease    stage 3   Chronic systolic CHF (congestive heart failure) (HCC)    Depression    Hypertension     Past Surgical History:  Procedure Laterality Date   APPENDECTOMY     HERNIA REPAIR Right    inguinal   JOINT REPLACEMENT Right    knee   TOTAL HIP ARTHROPLASTY Right 01/21/2020   Procedure: TOTAL HIP ARTHROPLASTY;  Surgeon: Mardee Lynwood SQUIBB, MD;  Location: [facility] ORS;  Service: Orthopedics;  Laterality: Right;    (Not in a hospital admission)  Social History   Socioeconomic History   Marital status: Divorced    Spouse name: Not on file   Number of children: Not on file   Years of education: Not on file   Highest education level: Not on file  Occupational History   Not on file  Tobacco Use   Smoking status: Never   Smokeless tobacco: Never  Vaping Use   Vaping status: Never Used  Substance and Sexual Activity   Alcohol use: Never   Drug use: Never   Sexual activity: Not Currently  Other Topics Concern   Not on file   Social History Narrative   Not on file   Social Drivers of Health   Financial Resource Strain: Patient Declined (05/01/2024)   Received from [facility]   Overall Financial Resource Strain (CARDIA)    Difficulty of Paying Living Expenses: Patient declined  Food Insecurity: Patient Declined (05/01/2024)   Received from [facility]   Hunger Vital Sign    Within the past 12 months, you worried that your food would run out before you got the money to buy more.: Patient declined    Within the past 12 months, the food you bought just didn't last and you didn't have money to get more.: Patient declined  Transportation Needs: Patient Declined (05/01/2024)   Received from [facility] - Transportation    In the past 12 months, has lack of transportation kept you from medical appointments or from getting medications?: Patient declined    Lack of Transportation (Non-Medical): Patient declined  Physical Activity: Not on file  Stress: Not on file  Social Connections: Not on file  Intimate Partner Violence: Not At Risk (06/27/2022)   Humiliation, Afraid, Rape, and Kick questionnaire    Fear of Current or Ex-Partner: No    Emotionally Abused: No    Physically Abused: No    Sexually Abused: No    Family History  Problem Relation Age  of Onset   Heart failure Son      Vitals:   05/18/24 2348 05/19/24 0340 05/19/24 0630 05/19/24 0654  BP: 136/64 (!) 147/71 (!) 141/72   Pulse: 61 64 (!) 55 61  Resp: 16 16 13    Temp:    98 F (36.7 C)  TempSrc:    Oral  SpO2: 96% 96% 98%   Height:        PHYSICAL EXAM General: well nourished, in no acute distress. HEENT: Normocephalic and atraumatic. Neck: No JVD.  Lungs: Normal respiratory effort. Clear bilaterally to auscultation. No wheezes, crackles, rhonchi.  Heart: HRRR. Normal S1 and S2 without gallops or murmurs.  Abdomen: Non-distended appearing.  Msk: Normal strength and tone for  age. Extremities: Warm and well perfused. No clubbing, cyanosis. 1+ edema.  Neuro: confused  Labs: Basic Metabolic Panel: Recent Labs    05/17/24 2055 05/18/24 0303 05/19/24 0424  NA  --  140 139  K  --  4.3 4.4  CL  --  105 101  CO2  --  25 29  GLUCOSE  --  64* 74  BUN  --  34* 34*  CREATININE  --  1.57* 1.75*  CALCIUM  --  8.7* 8.5*  MG 2.2  --   --    Liver Function Tests: Recent Labs    05/17/24 1538  AST 23  ALT 13  ALKPHOS 87  BILITOT 0.5  PROT 7.5  ALBUMIN 3.1*   No results for input(s): LIPASE, AMYLASE in the last 72 hours. CBC: Recent Labs    05/17/24 1538 05/18/24 0303  WBC 6.3 5.2  HGB 10.4* 9.2*  HCT 33.1* 30.0*  MCV 97.6 97.4  PLT 191 182   Cardiac Enzymes: Recent Labs    05/17/24 2054  CKTOTAL 48*   BNP: No results for input(s): BNP in the last 72 hours. D-Dimer: No results for input(s): DDIMER in the last 72 hours. Hemoglobin A1C: No results for input(s): HGBA1C in the last 72 hours. Fasting Lipid Panel: No results for input(s): CHOL, HDL, LDLCALC, TRIG, CHOLHDL, LDLDIRECT in the last 72 hours. Thyroid Function Tests: Recent Labs    05/17/24 2054  TSH 8.430*   Anemia Panel: No results for input(s): VITAMINB12, FOLATE, FERRITIN, TIBC, IRON , RETICCTPCT in the last 72 hours.   Radiology: ECHOCARDIOGRAM COMPLETE Result Date: 05/18/2024    ECHOCARDIOGRAM REPORT   Patient Name:   Jerome [name]. Date of Exam: 05/18/2024 Medical Rec #:  [id number]               Height:       69.0 in Accession #:    [id number]              Weight:       194.0 lb Date of Birth:  1935/02/09               BSA:          2.039 m Patient Age:    89 years                BP:           113/59 mmHg Patient Gender: M                       HR:           54 bpm. Exam Location:  [facility] Procedure: 2D Echo, Cardiac Doppler and Color Doppler (Both Spectral and Color  Flow Doppler were utilized during procedure). Indications:      I50.20* Unspecified systolic (congestive) heart failure  History:         Patient has prior history of Echocardiogram examinations, most                  recent 06/28/2022. CHF, CAD, Signs/Symptoms:Syncope; Risk                  Factors:Hypertension.  Sonographer:     Doyal Point MHA, BS, RDCS Referring Phys:  [id number] XILIN NIU Diagnosing Phys: Redell Cave MD  Sonographer Comments: Image acquisition challenging due to patient body habitus and Image acquisition challenging due to uncooperative patient. IMPRESSIONS  1. Left ventricular ejection fraction, by estimation, is 50 to 55%. The left ventricle has low normal function. The left ventricle has no regional wall motion abnormalities. There is mild left ventricular hypertrophy. Left ventricular diastolic parameters are consistent with Grade II diastolic dysfunction (pseudonormalization).  2. Right ventricular systolic function is low normal. The right ventricular size is moderately enlarged. There is moderately elevated pulmonary artery systolic pressure. The estimated right ventricular systolic pressure is 48.2 mmHg.  3. Left atrial size was severely dilated.  4. Right atrial size was severely dilated.  5. Moderate pleural effusion in the left lateral region.  6. The mitral valve is normal in structure. Mild mitral valve regurgitation.  7. The aortic valve is tricuspid. Aortic valve regurgitation is not visualized. Aortic valve sclerosis/calcification is present, without any evidence of aortic stenosis.  8. Aortic dilatation noted. There is mild dilatation of the ascending aorta, measuring 40 mm.  9. The inferior vena cava is dilated in size with <50% respiratory variability, suggesting right atrial pressure of 15 mmHg. FINDINGS  Left Ventricle: Left ventricular ejection fraction, by estimation, is 50 to 55%. The left ventricle has low normal function. The left ventricle has no regional wall motion abnormalities. The left ventricular internal cavity size  was normal in size. There is mild left ventricular hypertrophy. Left ventricular diastolic parameters are consistent with Grade II diastolic dysfunction (pseudonormalization). Right Ventricle: The right ventricular size is moderately enlarged. No increase in right ventricular wall thickness. Right ventricular systolic function is low normal. There is moderately elevated pulmonary artery systolic pressure. The tricuspid regurgitant velocity is 2.88 m/s, and with an assumed right atrial pressure of 15 mmHg, the estimated right ventricular systolic pressure is 48.2 mmHg. Left Atrium: Left atrial size was severely dilated. Right Atrium: Right atrial size was severely dilated. Pericardium: Trivial pericardial effusion is present. Mitral Valve: The mitral valve is normal in structure. Mild mitral valve regurgitation. Tricuspid Valve: The tricuspid valve is normal in structure. Tricuspid valve regurgitation is mild. Aortic Valve: The aortic valve is tricuspid. Aortic valve regurgitation is not visualized. Aortic valve sclerosis/calcification is present, without any evidence of aortic stenosis. Aortic valve mean gradient measures 3.0 mmHg. Aortic valve peak gradient measures 5.7 mmHg. Aortic valve area, by VTI measures 2.20 cm. Pulmonic Valve: The pulmonic valve was normal in structure. Pulmonic valve regurgitation is mild. Aorta: Aortic dilatation noted. There is mild dilatation of the ascending aorta, measuring 40 mm. Venous: The inferior vena cava is dilated in size with less than 50% respiratory variability, suggesting right atrial pressure of 15 mmHg. IAS/Shunts: No atrial level shunt detected by color flow Doppler. Additional Comments: There is a moderate pleural effusion in the left lateral region.  LEFT VENTRICLE PLAX 2D LVIDd:         4.20 cm  Diastology LVIDs:         3.10 cm   LV e' medial:    5.00 cm/s LV PW:         1.30 cm   LV E/e' medial:  17.2 LV IVS:        1.30 cm   LV e' lateral:   6.85 cm/s LVOT diam:      2.20 cm   LV E/e' lateral: 12.5 LV SV:         53 LV SV Index:   26 LVOT Area:     3.80 cm  RIGHT VENTRICLE RV Basal diam:  5.00 cm RV Mid diam:    4.30 cm RV S prime:     7.72 cm/s LEFT ATRIUM              Index        RIGHT ATRIUM           Index LA diam:        5.20 cm  2.55 cm/m   RA Area:     31.90 cm LA Vol (A2C):   90.4 ml  44.33 ml/m  RA Volume:   112.00 ml 54.92 ml/m LA Vol (A4C):   110.0 ml 53.94 ml/m LA Biplane Vol: 106.0 ml 51.97 ml/m  AORTIC VALVE AV Area (Vmax):    2.11 cm AV Area (Vmean):   1.99 cm AV Area (VTI):     2.20 cm AV Vmax:           119.00 cm/s AV Vmean:          84.100 cm/s AV VTI:            0.240 m AV Peak Grad:      5.7 mmHg AV Mean Grad:      3.0 mmHg LVOT Vmax:         66.00 cm/s LVOT Vmean:        44.000 cm/s LVOT VTI:          0.139 m LVOT/AV VTI ratio: 0.58  AORTA Ao Root diam: 3.50 cm Ao Asc diam:  4.00 cm MITRAL VALVE               TRICUSPID VALVE MV Area (PHT): 4.15 cm    TR Peak grad:   33.2 mmHg MV Decel Time: 183 msec    TR Vmax:        288.00 cm/s MV E velocity: 85.90 cm/s MV A velocity: 41.40 cm/s  SHUNTS MV E/A ratio:  2.07        Systemic VTI:  0.14 m                            Systemic Diam: 2.20 cm Redell Cave MD Electronically signed by Redell Cave MD Signature Date/Time: 05/18/2024/5:40:41 PM    Final    DG HIP UNILAT WITH PELVIS 2-3 VIEWS LEFT Result Date: 05/17/2024 CLINICAL DATA:  Pain EXAM: DG HIP (WITH OR WITHOUT PELVIS) 2-3V LEFT COMPARISON:  right hip and pelvis x-ray 01/21/2020 FINDINGS: The bones are diffusely osteopenic. There is no acute fracture or dislocation identified. There severe degenerative changes of the left hip with complete loss of joint space. There is mild sclerosis and mild osteophyte formation. This has progressed compared to 2021. Right hip arthroplasty is grossly within normal limits. Soft tissues are within normal limits. 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  Electronically Signed  By: Greig Pique M.D.   On: 05/17/2024 22:38   DG Foot Complete Left Result Date: 05/17/2024 CLINICAL DATA:  Heel pain EXAM: LEFT FOOT - COMPLETE 3+ VIEW COMPARISON:  None Available. FINDINGS: The bones are osteopenic. There is no acute fracture or focal osseous lesion. Joint spaces are maintained as can be seen. There is a small plantar calcaneal spur. Peripheral vascular calcifications are present. There is soft tissue swelling of the anterior foot. IMPRESSION: 1. No acute fracture or dislocation. 2. Soft tissue swelling of the anterior foot. Electronically Signed   By: Greig Pique M.D.   On: 05/17/2024 22:36   CT HEAD WO CONTRAST Result Date: 05/17/2024 EXAM: CT HEAD WITHOUT CONTRAST 05/17/2024 05:42:41 PM TECHNIQUE: CT of the head was performed without the administration of intravenous contrast. Automated exposure control, iterative reconstruction, and/or weight based adjustment of the mA/kV was utilized to reduce the radiation dose to as low as reasonably achievable. COMPARISON: Prior head CT 06/26/2022. CLINICAL HISTORY: 89 year old male with acute neuro deficit, stroke suspected. FINDINGS: Mild motion artifact despite repeated imaging attempts. BRAIN AND VENTRICLES: Brain volume stable from last year and normal for age. No acute hemorrhage. No evidence of acute infarct. No hydrocephalus. No extra-axial collection. No mass effect or midline shift. Gray white differentiation stable and within normal limits for age. Mild motion artifact despite repeated imaging attempts. ORBITS: No acute abnormality. SINUSES: Visible paranasal sinuses, middle ears and mastoids remain clear. SOFT TISSUES AND SKULL: No acute soft tissue abnormality. No skull fracture. Mild for age calcified atherosclerosis at the skull base. IMPRESSION: 1. Mildly motion degraded despite repeated imaging attempts. 2. No acute intracranial abnormality. Electronically signed by: Helayne Hurst MD 05/17/2024 06:02 PM  EST RP Workstation: [id number]    ECHO as above  TELEMETRY reviewed by me [facility]) 05/19/2024 : atrial fibrillation rate 50-60s  EKG reviewed by me: Afib SVR  Data reviewed by me [facility]) 05/19/2024: last 24h vitals tele labs imaging I/O provider notes  Principal Problem:   Atrial fibrillation with slow ventricular response (HCC) Active Problems:   Acute exacerbation of CHF (congestive heart failure) (HCC)   CAD (coronary artery disease)   HTN (hypertension)   Chronic diastolic CHF (congestive heart failure) (HCC)   Iron  deficiency anemia   Myocardial injury   UTI (urinary tract infection)   Hallucination   Chronic kidney disease, stage 3b (HCC)   Pain of left heel and left hip   BPH (benign prostatic hyperplasia)   Hypothermia    ASSESSMENT AND PLAN:   # Afib with slow ventricular response # Newly diagnosed atrial fibrillation # Acute heart failure preserved EF -Defer AV nodal blocking agents. Currently not requiring anything for rate control. -Eliquis dosing by pharmacy for Afib - currently on reduced dose due to Cr and age. -Continue diuresis with IV lasix . -Troponin mildly elevated, not consistent with ACS.  This patient's plan of care was discussed and created with Dr. Florencio and he is in agreement.    Signed: Danita Bloch, PA-C 05/19/2024, 9:53 AM [facility]

## 2024-05-19 NOTE — Progress Notes (Signed)
 Physical Therapy Treatment Patient Details Name: Jerome [name]. MRN: [id number] DOB: 12-25-1934 Today's Date: 05/19/2024   History of Present Illness Jerome [name]. is a 89 y.o. male with medical history significant of HTN, CAD, dCHF, gout, depression with anxiety, BPH, CKD-3B, iron  deficiency anemia, who presents with hallucination, increased urinary frequency, pain in left heel and the left hip.    PT Comments  Co-tx with OT for improved outcomes.  Pt is able to get to EOB with heavy mod a x 2.  Once sitting he is generally steady eating his lunch.  Despite prior reports pt does state he takes a few steps with RW to/from bathroom.  He is able to stand with inc time and cues to RW with min/mod a x 2.  Once up he is generally unsteady with assist to keep walker on the floor and for balance.  He does take a few sidesteps to left with min a x 2 and generally stooped posture with heavy lean on RW for support.  Returns to supine with good ability to lift his legs back up onto the bed.  Overall tolerates well but clearly not at baseline.  Will benefit from <3 hrs a day therapy at discharge.   If plan is discharge home, recommend the following: Two people to help with walking and/or transfers;A lot of help with bathing/dressing/bathroom   Can travel by private vehicle     No  Equipment Recommendations  None recommended by PT    Recommendations for Other Services       Precautions / Restrictions Precautions Precautions: Fall Restrictions Weight Bearing Restrictions Per Provider Order: No     Mobility  Bed Mobility Overal bed mobility: Needs Assistance Bed Mobility: Supine to Sit, Sit to Supine     Supine to sit: Mod assist, +2 for physical assistance Sit to supine: Max assist, +2 for physical assistance     Patient Response: Cooperative  Transfers Overall transfer level: Needs assistance Equipment used: Rolling walker (2 wheels) Transfers: Sit to/from Stand Sit  to Stand: Min assist, +2 physical assistance           General transfer comment: slow    Ambulation/Gait Ambulation/Gait assistance: Min assist, +2 physical assistance Gait Distance (Feet): 3 Feet Assistive device: Rolling walker (2 wheels) Gait Pattern/deviations: Step-to pattern Gait velocity: dec     General Gait Details: very few sidesteps along bed. stated he does walk some with RW at home to bathroom   Stairs             Wheelchair Mobility     Tilt Bed Tilt Bed Patient Response: Cooperative  Modified Rankin (Stroke Patients Only)       Balance Overall balance assessment: Needs assistance Sitting-balance support: Feet supported Sitting balance-Leahy Scale: Fair     Standing balance support: Bilateral upper extremity supported, Reliant on assistive device for balance Standing balance-Leahy Scale: Poor                              Communication Communication Communication: No apparent difficulties  Cognition Arousal: Alert Behavior During Therapy: WFL for tasks assessed/performed   PT - Cognitive impairments: No apparent impairments                         Following commands: Intact      Cueing Cueing Techniques: Verbal cues  Exercises      General  Comments        Pertinent Vitals/Pain Pain Assessment Pain Assessment: Faces Faces Pain Scale: Hurts little more Pain Location: L LE Pain Descriptors / Indicators: Discomfort, Sore Pain Intervention(s): Limited activity within patient's tolerance, Monitored during session, Repositioned    Home Living Family/patient expects to be discharged to:: Private residence Living Arrangements: Children Available Help at Discharge: Personal care attendant;Available PRN/intermittently;Available 24 hours/day Type of Home: House Home Access: Ramped entrance       Home Layout: One level Home Equipment: Wheelchair - Forensic Psychologist (2 wheels) Additional Comments: patient  reports he has caregiver  as much as I need her and grandson to assist him, he reports she's there for majority of the day, everyday and grandson comes PRN on the weekends    Prior Function            PT Goals (current goals can now be found in the care plan section) Progress towards PT goals: Progressing toward goals    Frequency    Min 2X/week      PT Plan      Co-evaluation PT/OT/SLP Co-Evaluation/Treatment: Yes Reason for Co-Treatment: To address functional/ADL transfers PT goals addressed during session: Mobility/safety with mobility OT goals addressed during session: ADL's and self-care      AM-PAC PT 6 Clicks Mobility   Outcome Measure  Help needed turning from your back to your side while in a flat bed without using bedrails?: A Lot Help needed moving from lying on your back to sitting on the side of a flat bed without using bedrails?: A Lot Help needed moving to and from a bed to a chair (including a wheelchair)?: Total Help needed standing up from a chair using your arms (e.g., wheelchair or bedside chair)?: A Lot Help needed to walk in hospital room?: Total Help needed climbing 3-5 steps with a railing? : Total 6 Click Score: 9    End of Session Equipment Utilized During Treatment: Gait belt Activity Tolerance: Patient limited by fatigue;Patient limited by pain Patient left: in bed;with call bell/phone within reach;with bed alarm set Nurse Communication: Mobility status PT Visit Diagnosis: Other abnormalities of gait and mobility (R26.89);Muscle weakness (generalized) (M62.81);Unsteadiness on feet (R26.81);Pain Pain - Right/Left: Left Pain - part of body: Leg     Time: 1203-1229 PT Time Calculation (min) (ACUTE ONLY): 26 min  Charges:    $Gait Training: 8-22 mins PT General Charges $$ ACUTE PT VISIT: 1 Visit                   Lauraine Gills, PTA 05/19/24, 1:20 PM

## 2024-05-19 NOTE — Evaluation (Signed)
 Occupational Therapy Evaluation Patient Details Name: Jerome [name]. MRN: [id number] DOB: [birth year] Today's Date: 05/19/2024   History of Present Illness   Jerome [name]. is a 89 y.o. male with medical history significant of HTN, CAD, dCHF, gout, depression with anxiety, BPH, CKD-3B, iron  deficiency anemia, who presents with hallucination, increased urinary frequency, pain in left heel and the left hip.     Clinical Impressions Pt was seen for OT evaluation and co-tx with PT this date to optimize safety for ADL transfers. Prior to hospital admission, pt was requiring assist for bathing, dressing, sometimes transfers in/out of bed to his w/c, and endorses difficulty at times with pericare after toileting. Caregiver present daily (not 24/7), to assist with all IADL, bathing, and dressing. Pt reports taking ACTA for medical appts and has assist from family/caregiver for groceries. Pt presents with deficits in strength, bilat shoulder ROM, balance, and activity tolerance, affecting safe and optimal ADL completion. Pt currently requires MOD A +2 for bed mobility, MIN A+2 for STS and taking shuffled lateral side steps EOB with RW, MAX A for LB ADL, and setup assist for meal. Pt tolerated sitting EOB for ~39min to eat lunch with fair static sitting balance. Required cues and education in optimal positioning for safety with self feeding and tray/food positioning to maximize access 2/2 decr bilat shoulder ROM. Pt would benefit from skilled OT services to address noted impairments and functional limitations (see below for any additional details) in order to maximize safety and independence while minimizing future risk of falls, injury, and readmission. Anticipate the need for follow up OT services upon acute hospital DC.     If plan is discharge home, recommend the following:   A lot of help with walking and/or transfers;A lot of help with bathing/dressing/bathroom;Assistance with  cooking/housework;Assist for transportation;Help with stairs or ramp for entrance;Direct supervision/assist for medications management;Direct supervision/assist for financial management     Functional Status Assessment   Patient has had a recent decline in their functional status and demonstrates the ability to make significant improvements in function in a reasonable and predictable amount of time.     Equipment Recommendations   Other (comment) (defer)     Recommendations for Other Services         Precautions/Restrictions   Precautions Precautions: Fall Restrictions Weight Bearing Restrictions Per Provider Order: No     Mobility Bed Mobility Overal bed mobility: Needs Assistance Bed Mobility: Supine to Sit, Sit to Supine     Supine to sit: Mod assist, +2 for physical assistance Sit to supine: Max assist, +2 for physical assistance        Transfers Overall transfer level: Needs assistance Equipment used: Rolling walker (2 wheels) Transfers: Sit to/from Stand Sit to Stand: Min assist, +2 physical assistance           General transfer comment: slow      Balance Overall balance assessment: Needs assistance Sitting-balance support: Feet supported Sitting balance-Leahy Scale: Fair     Standing balance support: Bilateral upper extremity supported, Reliant on assistive device for balance Standing balance-Leahy Scale: Poor                             ADL either performed or assessed with clinical judgement   ADL Overall ADL's : Needs assistance/impaired Eating/Feeding: Sitting;Set up Eating/Feeding Details (indicate cue type and reason): requierd assist for cutting meat/veggies, oopening packets/containers  Lower Body Dressing: Sitting/lateral leans;Bed level Lower Body Dressing Details (indicate cue type and reason): anticipate MAX A                     Vision         Perception         Praxis          Pertinent Vitals/Pain Pain Assessment Pain Assessment: No/denies pain     Extremity/Trunk Assessment Upper Extremity Assessment Upper Extremity Assessment: Generalized weakness (decreased shoulder ROM bilat, L thumb red, hx arthritis)   Lower Extremity Assessment Lower Extremity Assessment: Generalized weakness;Defer to PT evaluation       Communication Communication Communication: No apparent difficulties   Cognition Arousal: Alert Behavior During Therapy: WFL for tasks assessed/performed Cognition: No family/caregiver present to determine baseline             OT - Cognition Comments: pt reports recent hallucinations pt cites med meds as culprit, alert and oriented x4, follows commands                 Following commands: Intact       Cueing  General Comments   Cueing Techniques: Verbal cues      Exercises Other Exercises Other Exercises: Pt edu in positioning to improve safety and access with self feeding   Shoulder Instructions      Home Living Family/patient expects to be discharged to:: Private residence Living Arrangements: Children Available Help at Discharge: Personal care attendant;Available PRN/intermittently;Available 24 hours/day Type of Home: House Home Access: Ramped entrance     Home Layout: One level     Bathroom Shower/Tub: Sponge bathes at baseline   Bathroom Toilet: Handicapped height     Home Equipment: Wheelchair - Forensic Psychologist (2 wheels)   Additional Comments: patient reports he has caregiver  as much as I need her and grandson to assist him, he reports she's there for majority of the day, everyday and grandson comes PRN on the weekends      Prior Functioning/Environment Prior Level of Function : Needs assist       Physical Assist : Mobility (physical);ADLs (physical) Mobility (physical): Transfers;Bed mobility ADLs (physical): Bathing;Dressing;IADLs Mobility Comments: needs assistance with bed  mobility and transfers at baseline ADLs Comments: needs assistance dressing and bathing at baseline, assist for meds, Meals on wheels, housekeeping, ACTA takes to dr appts, family/caregiver help wiht groceries    OT Problem List: Decreased strength;Decreased range of motion;Cardiopulmonary status limiting activity;Impaired balance (sitting and/or standing);Decreased knowledge of use of DME or AE;Impaired UE functional use   OT Treatment/Interventions: Self-care/ADL training;Therapeutic exercise;Therapeutic activities;DME and/or AE instruction;Patient/family education;Balance training      OT Goals(Current goals can be found in the care plan section)   Acute Rehab OT Goals Patient Stated Goal: get better OT Goal Formulation: With patient Time For Goal Achievement: 06/02/24 Potential to Achieve Goals: Good ADL Goals Pt Will Perform Upper Body Dressing: sitting;with caregiver independent in assisting;with min assist Pt Will Perform Lower Body Dressing: sit to/from stand;with caregiver independent in assisting Pt Will Transfer to Toilet: ambulating;bedside commode;with contact guard assist (RW, 2-3') Pt Will Perform Toileting - Clothing Manipulation and hygiene: with adaptive equipment;sitting/lateral leans;with modified independence   OT Frequency:  Min 2X/week    Co-evaluation PT/OT/SLP Co-Evaluation/Treatment: Yes Reason for Co-Treatment: To address functional/ADL transfers PT goals addressed during session: Mobility/safety with mobility OT goals addressed during session: ADL's and self-care      AM-PAC OT 6 Clicks Daily Activity  Outcome Measure Help from another person eating meals?: None (after set up) Help from another person taking care of personal grooming?: A Little Help from another person toileting, which includes using toliet, bedpan, or urinal?: A Lot Help from another person bathing (including washing, rinsing, drying)?: A Lot Help from another person to put on  and taking off regular upper body clothing?: A Lot Help from another person to put on and taking off regular lower body clothing?: A Lot 6 Click Score: 15   End of Session Equipment Utilized During Treatment: Rolling walker (2 wheels)  Activity Tolerance: Patient tolerated treatment well Patient left: in bed;with call bell/phone within reach;with bed alarm set  OT Visit Diagnosis: Unsteadiness on feet (R26.81);Muscle weakness (generalized) (M62.81)                Time: 8797-8759 OT Time Calculation (min): 38 min Charges:  OT General Charges $OT Visit: 1 Visit OT Evaluation $OT Eval Moderate Complexity: 1 Mod OT Treatments $Self Care/Home Management : 8-22 mins  Warren SAUNDERS., MPH, MS, OTR/L ascom [phone] 05/19/24, 1:13 PM

## 2024-05-19 NOTE — Progress Notes (Signed)
 Progress Note   Patient: Jerome [name]. FMW:[id number] DOB: 1935-02-19 DOA: 05/17/2024     2 DOS: the patient was seen and examined on 05/19/2024   Brief hospital course: 89 y.o. male with medical history significant of HTN, CAD, dCHF, gout, depression with anxiety, BPH, CKD-3B, iron  deficiency anemia, who presents with hallucination, increased urinary frequency, pain in left heel and the left hip.  Cardiology consulted for management of new onset atrial fibrillation as well as possible new onset acute exacerbation of CHF.  Assessment and Plan:  89 years old male with past medical history of multiple comorbidities, presented to the emergency room on 05/17/2024 with multiple chief complaints.  Atrial fibrillation with slow ventricular response, POA: Continue to monitor on telemetry closely.  Strict intake output monitoring, daily weight, heart healthy diet. Follow-up magnesium  and potassium levels closely. She is not requiring any AV nodal blocking agents for rate control. Continue Eliquis for stroke prophylaxis. Transthoracic echocardiogram was done and EF of 50 to 55% with grade 2 diastolic dysfunction   Acute diastolic CHF, POA:  NYHA class III.  Echocardiogram was done which showed evidence of grade 2 diastolic dysfunction with normal ejection fraction.  Cardiology is on board.  Strict intake of monitoring, daily weight, fluid striction of 1.5 L/day.  Continue diuresis with Lasix  with close monitoring of BMP.  Possible acute cystitis, POA: Continue Rocephin  follow-up final culture results  Hallucinations, POA: Likely secondary to UTI in the setting of underlying baseline Alzheimer's dementia  Physical deconditioning, POA: PT/OT consulted.  TOC consulted to help with a safe disposition.  CKD stage IIIb, POA: Kidney function at baseline.  Continue to monitor BMP closely  Essential hypertension, POA: Continue with Lasix .  Left heel pain, POA: Stage I pressure ulcer.  X-ray is  negative for any acute fractures.  BPH, POA: Continue Flomax   Major depressive disorder, POA: Continue with Effexor     Subjective: Patient refused multiple medications overnight.  Denies any chest pain or shortness of breath.  Case discussed with cardiology team as well.  Physical Exam: Vitals:   05/18/24 2348 05/19/24 0340 05/19/24 0630 05/19/24 0654  BP: 136/64 (!) 147/71 (!) 141/72   Pulse: 61 64 (!) 55 61  Resp: 16 16 13    Temp:    98 F (36.7 C)  TempSrc:    Oral  SpO2: 96% 96% 98%   Height:       Constitutional: NAD, calm, comfortable Eyes: PERRL, lids and conjunctivae normal ENMT: Mucous membranes are moist. Posterior pharynx clear of any exudate or lesions.Normal dentition.  Neck: normal, supple, no masses, no thyromegaly Respiratory: clear to auscultation bilaterally, no wheezing, no crackles. Normal respiratory effort. No accessory muscle use.  Cardiovascular: Regular rate and rhythm, no murmurs / rubs / gallops. No extremity edema. 2+ pedal pulses. No carotid bruits.  Abdomen: no tenderness, no masses palpated. No hepatosplenomegaly. Bowel sounds positive.  Musculoskeletal: no clubbing / cyanosis. No joint deformity upper and lower extremities. Good ROM, no contractures. Normal muscle tone.  Skin: no rashes, lesions, ulcers. No induration Neurologic: CN 2-12 grossly intact. Sensation intact, DTR normal. Strength 5/5 x all 4 extremities.   Data Reviewed:  There are no new results to review at this time.  Family Communication: Caregiver at the bedside  Disposition: Status is: Inpatient Remains inpatient appropriate because: Acute exacerbation of CHF, A-fib  Planned Discharge Destination: Home with Home Health    Time spent: 41 minutes  Author: Deliliah Room, MD 05/19/2024 9:56 AM  For  on call review [email or web address].

## 2024-05-19 NOTE — NC FL2 (Signed)
 [place]  MEDICAID FL2 LEVEL OF CARE FORM     IDENTIFICATION  Patient Name: Jerome [name]. Birthdate: 02-13-1935 Sex: male Admission Date (Current Location): 05/17/2024  [facility] and [place] Number:  Chiropodist and Address:  [facility], [address], [place], [place] [address]      Provider Number: [id number]  Attending Physician Name and Address:  Dino Antu, MD  Relative Name and Phone Number:  Roi Jafari grandson [phone]    Current Level of Care: Hospital Recommended Level of Care: Skilled Nursing Facility Prior Approval Number:    Date Approved/Denied:   PASRR Number: [id number]  Discharge Plan: SNF    Current Diagnoses: Patient Active Problem List   Diagnosis Date Noted   New onset atrial fibrillation (HCC) 05/17/2024   Myocardial injury 05/17/2024   UTI (urinary tract infection) 05/17/2024   Hallucination 05/17/2024   Chronic kidney disease, stage 3b (HCC) 05/17/2024   Pain of left heel and left hip 05/17/2024   Atrial fibrillation with slow ventricular response (HCC) 05/17/2024   BPH (benign prostatic hyperplasia) 05/17/2024   Hypothermia 05/17/2024   Iron  deficiency anemia 06/30/2022   Pressure ulcer of left heel, unstageable (HCC) 06/29/2022   Chronic diastolic CHF (congestive heart failure) (HCC) 06/29/2022   Anemia of chronic disease 06/29/2022   Hyponatremia 06/28/2022   Pressure injury of skin 06/28/2022   Overweight 06/28/2022   Syncope due to orthostatic hypotension 06/28/2022   Syncope 06/26/2022   Hx of total hip arthroplasty, right 01/21/2020   Sepsis (HCC) 03/20/2019   CAD (coronary artery disease) 03/20/2019   HTN (hypertension) 03/20/2019   Acute on chronic renal failure 03/20/2019   Severe sepsis (HCC) 03/20/2019   Right leg pain 12/13/2018   Acute exacerbation of CHF (congestive heart failure) (HCC) 12/09/2018   Benign essential hypertension 01/12/2014   Gout 01/12/2014     Orientation RESPIRATION BLADDER Height & Weight     Self, Time, Situation, Place  Normal External catheter (purwick) Weight:   Height:  5' 9 (175.3 cm)  BEHAVIORAL SYMPTOMS/MOOD NEUROLOGICAL BOWEL NUTRITION STATUS      Continent Diet  AMBULATORY STATUS COMMUNICATION OF NEEDS Skin   Extensive Assist Verbally Normal                       Personal Care Assistance Level of Assistance  Bathing, Feeding, Dressing Bathing Assistance: Limited assistance Feeding assistance: Independent Dressing Assistance: Limited assistance     Functional Limitations Info  Sight, Hearing, Speech Sight Info: Adequate Hearing Info: Adequate Speech Info: Adequate    SPECIAL CARE FACTORS FREQUENCY  PT (By licensed PT), OT (By licensed OT)     PT Frequency: 5days/week OT Frequency: 5 days/week            Contractures Contractures Info: Not present    Additional Factors Info  Code Status, Allergies Code Status Info: Full Allergies Info: Gabapentin            Current Medications (05/19/2024):  This is the current hospital active medication list Current Facility-Administered Medications  Medication Dose Route Frequency Provider Last Rate Last Admin   acetaminophen  (TYLENOL ) tablet 650 mg  650 mg Oral Q6H PRN Niu, Xilin, MD       apixaban  (ELIQUIS ) tablet 2.5 mg  2.5 mg Oral BID Kandis Devaughn Sayres, MD   2.5 mg at 05/19/24 1001   cefTRIAXone  (ROCEPHIN ) 1 g in sodium chloride  0.9 % 100 mL IVPB  1 g Intravenous Q24H Niu, Xilin,  MD       furosemide  (LASIX ) injection 40 mg  40 mg Intravenous Q12H Niu, Xilin, MD   40 mg at 05/19/24 1001   hydrALAZINE  (APRESOLINE ) injection 5 mg  5 mg Intravenous Q2H PRN Niu, Xilin, MD       methocarbamol (ROBAXIN) tablet 500 mg  500 mg Oral Q8H PRN Niu, Xilin, MD       ondansetron  (ZOFRAN ) injection 4 mg  4 mg Intravenous Q8H PRN Niu, Xilin, MD       tamsulosin  (FLOMAX ) capsule 0.4 mg  0.4 mg Oral q1800 Niu, Xilin, MD   0.4 mg at 05/19/24 1001    venlafaxine XR (EFFEXOR-XR) 24 hr capsule 75 mg  75 mg Oral Daily Kandis Devaughn Sayres, MD   75 mg at 05/19/24 1001   Current Outpatient Medications  Medication Sig Dispense Refill   Ascorbic Acid  (VITAMIN C  WITH ROSE HIPS) 500 MG tablet Take 500 mg by mouth in the morning and at bedtime.     aspirin  EC 81 MG tablet Take 81 mg by mouth daily.     carvedilol  (COREG ) 6.25 MG tablet Take 1 tablet (6.25 mg total) by mouth 2 (two) times daily with a meal. 60 tablet 1   hydrochlorothiazide (HYDRODIURIL) 12.5 MG tablet Take 12.5 mg by mouth daily.     oxybutynin (DITROPAN) 5 MG tablet Take 5 mg by mouth at bedtime.     polyethylene glycol powder ([employer] PURELAX) 17 GM/SCOOP powder Take 17 g by mouth daily as needed for mild constipation. MIX 17 GRAMS IN 4-8 OUNCES OF FLUID & TAKE DAILY AS NEEDED FOR CONSTIPATION FOR UP TO 30 DAYS     tamsulosin  (FLOMAX ) 0.4 MG CAPS capsule Take 0.4 mg by mouth daily at 6 PM.      venlafaxine XR (EFFEXOR-XR) 75 MG 24 hr capsule Take 75 mg by mouth daily.     vitamin B-12 (CYANOCOBALAMIN ) 1000 MCG tablet Take 1,000 mcg by mouth daily.     amLODipine  (NORVASC ) 2.5 MG tablet Take 2.5 mg by mouth daily. (Patient not taking: Reported on 05/17/2024)     amLODipine  (NORVASC ) 5 MG tablet Take 5 mg by mouth daily. (Patient not taking: Reported on 05/17/2024)     iron  polysaccharides (NIFEREX) 150 MG capsule Take 1 capsule (150 mg total) by mouth daily. (Patient not taking: Reported on 05/17/2024)     Multiple Vitamins-Minerals (MULTIVITAMIN WITH MINERALS) tablet Take 1 tablet by mouth daily. (Patient not taking: Reported on 05/17/2024)       Discharge Medications: Please see discharge summary for a list of discharge medications.  Relevant Imaging Results:  Relevant Lab Results:   Additional Information [id number]  Delphine KANDICE Bring, RN

## 2024-05-20 ENCOUNTER — Telehealth (HOSPITAL_COMMUNITY): Payer: Self-pay | Admitting: Pharmacy Technician

## 2024-05-20 ENCOUNTER — Other Ambulatory Visit (HOSPITAL_COMMUNITY): Payer: Self-pay

## 2024-05-20 DIAGNOSIS — D631 Anemia in chronic kidney disease: Secondary | ICD-10-CM | POA: Diagnosis not present

## 2024-05-20 DIAGNOSIS — L89620 Pressure ulcer of left heel, unstageable: Secondary | ICD-10-CM | POA: Diagnosis not present

## 2024-05-20 DIAGNOSIS — F32A Depression, unspecified: Secondary | ICD-10-CM

## 2024-05-20 DIAGNOSIS — I5022 Chronic systolic (congestive) heart failure: Secondary | ICD-10-CM | POA: Diagnosis not present

## 2024-05-20 DIAGNOSIS — M1030 Gout due to renal impairment, unspecified site: Secondary | ICD-10-CM | POA: Diagnosis not present

## 2024-05-20 DIAGNOSIS — I4891 Unspecified atrial fibrillation: Secondary | ICD-10-CM | POA: Diagnosis not present

## 2024-05-20 DIAGNOSIS — Z7189 Other specified counseling: Secondary | ICD-10-CM | POA: Diagnosis not present

## 2024-05-20 DIAGNOSIS — N1831 Chronic kidney disease, stage 3a: Secondary | ICD-10-CM | POA: Diagnosis not present

## 2024-05-20 DIAGNOSIS — I130 Hypertensive heart and chronic kidney disease with heart failure and stage 1 through stage 4 chronic kidney disease, or unspecified chronic kidney disease: Secondary | ICD-10-CM | POA: Diagnosis not present

## 2024-05-20 DIAGNOSIS — I2510 Atherosclerotic heart disease of native coronary artery without angina pectoris: Secondary | ICD-10-CM | POA: Diagnosis not present

## 2024-05-20 LAB — BASIC METABOLIC PANEL WITH GFR
Anion gap: 9 (ref 5–15)
BUN: 33 mg/dL — ABNORMAL HIGH (ref 8–23)
CO2: 31 mmol/L (ref 22–32)
Calcium: 8.5 mg/dL — ABNORMAL LOW (ref 8.9–10.3)
Chloride: 97 mmol/L — ABNORMAL LOW (ref 98–111)
Creatinine, Ser: 1.84 mg/dL — ABNORMAL HIGH (ref 0.61–1.24)
GFR, Estimated: 35 mL/min — ABNORMAL LOW (ref >60)
Glucose, Bld: 94 mg/dL (ref 70–99)
Potassium: 4 mmol/L (ref 3.5–5.1)
Sodium: 136 mmol/L (ref 135–145)

## 2024-05-20 MED ORDER — FUROSEMIDE 40 MG PO TABS
40.0000 mg | ORAL_TABLET | Freq: Every day | ORAL | Status: DC
  Administered 2024-05-20 – 2024-05-23 (×4): 40 mg via ORAL
  Filled 2024-05-20 (×4): qty 1

## 2024-05-20 NOTE — Assessment & Plan Note (Signed)
 Resolved

## 2024-05-20 NOTE — Progress Notes (Signed)
 [facility] CLINIC CARDIOLOGY PROGRESS NOTE       Patient ID: Jerome [name]. MRN: [id number] DOB/AGE: Oct 22, 1934 89 y.o.  Admit date: 05/17/2024 Referring Physician [facility] Primary Physician Diedra Lame, MD Primary Cardiologist NA Reason for Consultation Afib, CHF  HPI: Jerome [name]. is a 89 y.o. male with medical history significant of HTN, CAD, dCHF, gout, depression with anxiety, BPH, CKD-3B, iron  deficiency anemia, who presents with hallucination, increased urinary frequency. Cardiology was placed on consultation for Afib and AoCHF.   Interval history: -Patient seen and examined this AM, resting in bed. -Denies dizziness/lightheadedness. HR controlled.  -Denies SOB. LE edema somewhat improved, still R > L.   Review of systems complete and found to be negative unless listed above     Past Medical History:  Diagnosis Date   Anxiety    Arthritis    CAD (coronary artery disease)    Chronic kidney disease    stage 3   Chronic systolic CHF (congestive heart failure) (HCC)    Depression    Hypertension     Past Surgical History:  Procedure Laterality Date   APPENDECTOMY     HERNIA REPAIR Right    inguinal   JOINT REPLACEMENT Right    knee   TOTAL HIP ARTHROPLASTY Right 01/21/2020   Procedure: TOTAL HIP ARTHROPLASTY;  Surgeon: Mardee Lynwood SQUIBB, MD;  Location: [facility] ORS;  Service: Orthopedics;  Laterality: Right;    Medications Prior to Admission  Medication Sig Dispense Refill Last Dose/Taking   Ascorbic Acid  (VITAMIN C  WITH ROSE HIPS) 500 MG tablet Take 500 mg by mouth in the morning and at bedtime.   Taking   aspirin  EC 81 MG tablet Take 81 mg by mouth daily.   Taking   carvedilol  (COREG ) 6.25 MG tablet Take 1 tablet (6.25 mg total) by mouth 2 (two) times daily with a meal. 60 tablet 1 Taking   oxybutynin (DITROPAN) 5 MG tablet Take 5 mg by mouth at bedtime.   Taking   polyethylene glycol powder ([employer] PURELAX) 17 GM/SCOOP powder Take 17 g by mouth daily  as needed for mild constipation. MIX 17 GRAMS IN 4-8 OUNCES OF FLUID & TAKE DAILY AS NEEDED FOR CONSTIPATION FOR UP TO 30 DAYS   Taking As Needed   tamsulosin  (FLOMAX ) 0.4 MG CAPS capsule Take 0.4 mg by mouth daily at 6 PM.    Taking   venlafaxine XR (EFFEXOR-XR) 75 MG 24 hr capsule Take 75 mg by mouth daily.   Taking   vitamin B-12 (CYANOCOBALAMIN ) 1000 MCG tablet Take 1,000 mcg by mouth daily.   Taking   amLODipine  (NORVASC ) 2.5 MG tablet Take 2.5 mg by mouth daily. (Patient not taking: Reported on 05/17/2024)   Not Taking   amLODipine  (NORVASC ) 5 MG tablet Take 5 mg by mouth daily. (Patient not taking: Reported on 05/17/2024)   Not Taking   iron  polysaccharides (NIFEREX) 150 MG capsule Take 1 capsule (150 mg total) by mouth daily. (Patient not taking: Reported on 05/17/2024)   Not Taking   Multiple Vitamins-Minerals (MULTIVITAMIN WITH MINERALS) tablet Take 1 tablet by mouth daily. (Patient not taking: Reported on 05/17/2024)   Not Taking   Social History   Socioeconomic History   Marital status: Divorced    Spouse name: Not on file   Number of children: Not on file   Years of education: Not on file   Highest education level: Not on file  Occupational History   Not on file  Tobacco Use  Smoking status: Never   Smokeless tobacco: Never  Vaping Use   Vaping status: Never Used  Substance and Sexual Activity   Alcohol use: Never   Drug use: Never   Sexual activity: Not Currently  Other Topics Concern   Not on file  Social History Narrative   Not on file   Social Drivers of Health   Financial Resource Strain: Patient Declined (05/01/2024)   Received from [facility]   Overall Financial Resource Strain (CARDIA)    Difficulty of Paying Living Expenses: Patient declined  Food Insecurity: Patient Declined (05/01/2024)   Received from [facility]   Hunger Vital Sign    Within the past 12 months, you worried that your food would run out before you  got the money to buy more.: Patient declined    Within the past 12 months, the food you bought just didn't last and you didn't have money to get more.: Patient declined  Transportation Needs: Patient Declined (05/01/2024)   Received from [facility]    In the past 12 months, has lack of transportation kept you from medical appointments or from getting medications?: Patient declined    Lack of Transportation (Non-Medical): Patient declined  Physical Activity: Not on file  Stress: Not on file  Social Connections: Not on file  Intimate Partner Violence: Not At Risk (06/27/2022)   Humiliation, Afraid, Rape, and Kick questionnaire    Fear of Current or Ex-Partner: No    Emotionally Abused: No    Physically Abused: No    Sexually Abused: No    Family History  Problem Relation Age of Onset   Heart failure Son      Vitals:   05/20/24 0037 05/20/24 0335 05/20/24 0340 05/20/24 0859  BP: 125/66 (!) 110/59  138/76  Pulse: (!) 59 (!) 58  63  Resp: 17 18  18   Temp: 98.2 F (36.8 C) 98 F (36.7 C)  97.8 F (36.6 C)  TempSrc:      SpO2: 90% 93%  94%  Weight:   72.8 kg   Height:        PHYSICAL EXAM General: well nourished, in no acute distress. HEENT: Normocephalic and atraumatic. Neck: No JVD.  Lungs: Normal respiratory effort. Clear bilaterally to auscultation. No wheezes, crackles, rhonchi.  Heart: HRRR. Normal S1 and S2 without gallops or murmurs.  Abdomen: Non-distended appearing.  Msk: Normal strength and tone for age. Extremities: Warm and well perfused. No clubbing, cyanosis. 1+ edema.  Neuro: confused  Labs: Basic Metabolic Panel: Recent Labs    05/17/24 2055 05/18/24 0303 05/19/24 0424 05/20/24 0443  NA  --    < > 139 136  K  --    < > 4.4 4.0  CL  --    < > 101 97*  CO2  --    < > 29 31  GLUCOSE  --    < > 74 94  BUN  --    < > 34* 33*  CREATININE  --    < > 1.75* 1.84*  CALCIUM  --    < > 8.5* 8.5*  MG 2.2  --   --    --    < > = values in this interval not displayed.   Liver Function Tests: Recent Labs    05/17/24 1538  AST 23  ALT 13  ALKPHOS 87  BILITOT 0.5  PROT 7.5  ALBUMIN 3.1*  No results for input(s): LIPASE, AMYLASE in the last 72 hours. CBC: Recent Labs    05/17/24 1538 05/18/24 0303  WBC 6.3 5.2  HGB 10.4* 9.2*  HCT 33.1* 30.0*  MCV 97.6 97.4  PLT 191 182   Cardiac Enzymes: Recent Labs    05/17/24 2054  CKTOTAL 48*   BNP: No results for input(s): BNP in the last 72 hours. D-Dimer: No results for input(s): DDIMER in the last 72 hours. Hemoglobin A1C: No results for input(s): HGBA1C in the last 72 hours. Fasting Lipid Panel: No results for input(s): CHOL, HDL, LDLCALC, TRIG, CHOLHDL, LDLDIRECT in the last 72 hours. Thyroid Function Tests: Recent Labs    05/17/24 2054  TSH 8.430*   Anemia Panel: No results for input(s): VITAMINB12, FOLATE, FERRITIN, TIBC, IRON , RETICCTPCT in the last 72 hours.   Radiology: ECHOCARDIOGRAM COMPLETE Result Date: 05/18/2024    ECHOCARDIOGRAM REPORT   Patient Name:   Afshin Chrystal. Date of Exam: 05/18/2024 Medical Rec #:  [id number]               Height:       69.0 in Accession #:    [id number]              Weight:       194.0 lb Date of Birth:  1934/09/05               BSA:          2.039 m Patient Age:    89 years                BP:           113/59 mmHg Patient Gender: M                       HR:           54 bpm. Exam Location:  [facility] Procedure: 2D Echo, Cardiac Doppler and Color Doppler (Both Spectral and Color            Flow Doppler were utilized during procedure). Indications:     I50.20* Unspecified systolic (congestive) heart failure  History:         Patient has prior history of Echocardiogram examinations, most                  recent 06/28/2022. CHF, CAD, Signs/Symptoms:Syncope; Risk                  Factors:Hypertension.  Sonographer:     Doyal Point MHA, BS, RDCS Referring Phys:   [id number] XILIN NIU Diagnosing Phys: Redell Cave MD  Sonographer Comments: Image acquisition challenging due to patient body habitus and Image acquisition challenging due to uncooperative patient. IMPRESSIONS  1. Left ventricular ejection fraction, by estimation, is 50 to 55%. The left ventricle has low normal function. The left ventricle has no regional wall motion abnormalities. There is mild left ventricular hypertrophy. Left ventricular diastolic parameters are consistent with Grade II diastolic dysfunction (pseudonormalization).  2. Right ventricular systolic function is low normal. The right ventricular size is moderately enlarged. There is moderately elevated pulmonary artery systolic pressure. The estimated right ventricular systolic pressure is 48.2 mmHg.  3. Left atrial size was severely dilated.  4. Right atrial size was severely dilated.  5. Moderate pleural effusion in the left lateral region.  6. The mitral valve is normal in structure. Mild mitral valve regurgitation.  7. The aortic valve is tricuspid. Aortic  valve regurgitation is not visualized. Aortic valve sclerosis/calcification is present, without any evidence of aortic stenosis.  8. Aortic dilatation noted. There is mild dilatation of the ascending aorta, measuring 40 mm.  9. The inferior vena cava is dilated in size with <50% respiratory variability, suggesting right atrial pressure of 15 mmHg. FINDINGS  Left Ventricle: Left ventricular ejection fraction, by estimation, is 50 to 55%. The left ventricle has low normal function. The left ventricle has no regional wall motion abnormalities. The left ventricular internal cavity size was normal in size. There is mild left ventricular hypertrophy. Left ventricular diastolic parameters are consistent with Grade II diastolic dysfunction (pseudonormalization). Right Ventricle: The right ventricular size is moderately enlarged. No increase in right ventricular wall thickness. Right ventricular systolic  function is low normal. There is moderately elevated pulmonary artery systolic pressure. The tricuspid regurgitant velocity is 2.88 m/s, and with an assumed right atrial pressure of 15 mmHg, the estimated right ventricular systolic pressure is 48.2 mmHg. Left Atrium: Left atrial size was severely dilated. Right Atrium: Right atrial size was severely dilated. Pericardium: Trivial pericardial effusion is present. Mitral Valve: The mitral valve is normal in structure. Mild mitral valve regurgitation. Tricuspid Valve: The tricuspid valve is normal in structure. Tricuspid valve regurgitation is mild. Aortic Valve: The aortic valve is tricuspid. Aortic valve regurgitation is not visualized. Aortic valve sclerosis/calcification is present, without any evidence of aortic stenosis. Aortic valve mean gradient measures 3.0 mmHg. Aortic valve peak gradient measures 5.7 mmHg. Aortic valve area, by VTI measures 2.20 cm. Pulmonic Valve: The pulmonic valve was normal in structure. Pulmonic valve regurgitation is mild. Aorta: Aortic dilatation noted. There is mild dilatation of the ascending aorta, measuring 40 mm. Venous: The inferior vena cava is dilated in size with less than 50% respiratory variability, suggesting right atrial pressure of 15 mmHg. IAS/Shunts: No atrial level shunt detected by color flow Doppler. Additional Comments: There is a moderate pleural effusion in the left lateral region.  LEFT VENTRICLE PLAX 2D LVIDd:         4.20 cm   Diastology LVIDs:         3.10 cm   LV e' medial:    5.00 cm/s LV PW:         1.30 cm   LV E/e' medial:  17.2 LV IVS:        1.30 cm   LV e' lateral:   6.85 cm/s LVOT diam:     2.20 cm   LV E/e' lateral: 12.5 LV SV:         53 LV SV Index:   26 LVOT Area:     3.80 cm  RIGHT VENTRICLE RV Basal diam:  5.00 cm RV Mid diam:    4.30 cm RV S prime:     7.72 cm/s LEFT ATRIUM              Index        RIGHT ATRIUM           Index LA diam:        5.20 cm  2.55 cm/m   RA Area:     31.90 cm LA  Vol (A2C):   90.4 ml  44.33 ml/m  RA Volume:   112.00 ml 54.92 ml/m LA Vol (A4C):   110.0 ml 53.94 ml/m LA Biplane Vol: 106.0 ml 51.97 ml/m  AORTIC VALVE AV Area (Vmax):    2.11 cm AV Area (Vmean):   1.99 cm AV Area (VTI):  2.20 cm AV Vmax:           119.00 cm/s AV Vmean:          84.100 cm/s AV VTI:            0.240 m AV Peak Grad:      5.7 mmHg AV Mean Grad:      3.0 mmHg LVOT Vmax:         66.00 cm/s LVOT Vmean:        44.000 cm/s LVOT VTI:          0.139 m LVOT/AV VTI ratio: 0.58  AORTA Ao Root diam: 3.50 cm Ao Asc diam:  4.00 cm MITRAL VALVE               TRICUSPID VALVE MV Area (PHT): 4.15 cm    TR Peak grad:   33.2 mmHg MV Decel Time: 183 msec    TR Vmax:        288.00 cm/s MV E velocity: 85.90 cm/s MV A velocity: 41.40 cm/s  SHUNTS MV E/A ratio:  2.07        Systemic VTI:  0.14 m                            Systemic Diam: 2.20 cm Redell Cave MD Electronically signed by Redell Cave MD Signature Date/Time: 05/18/2024/5:40:41 PM    Final    DG HIP UNILAT WITH PELVIS 2-3 VIEWS LEFT Result Date: 05/17/2024 CLINICAL DATA:  Pain EXAM: DG HIP (WITH OR WITHOUT PELVIS) 2-3V LEFT COMPARISON:  right hip and pelvis x-ray 01/21/2020 FINDINGS: The bones are diffusely osteopenic. There is no acute fracture or dislocation identified. There severe degenerative changes of the left hip with complete loss of joint space. There is mild sclerosis and mild osteophyte formation. This has progressed compared to 2021. Right hip arthroplasty is grossly within normal limits. Soft tissues are within normal limits. 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 Electronically Signed   By: Greig Pique M.D.   On: 05/17/2024 22:38   DG Foot Complete Left Result Date: 05/17/2024 CLINICAL DATA:  Heel pain EXAM: LEFT FOOT - COMPLETE 3+ VIEW COMPARISON:  None Available. FINDINGS: The bones are osteopenic. There is no acute fracture or focal osseous lesion. Joint spaces are  maintained as can be seen. There is a small plantar calcaneal spur. Peripheral vascular calcifications are present. There is soft tissue swelling of the anterior foot. IMPRESSION: 1. No acute fracture or dislocation. 2. Soft tissue swelling of the anterior foot. Electronically Signed   By: Greig Pique M.D.   On: 05/17/2024 22:36   CT HEAD WO CONTRAST Result Date: 05/17/2024 EXAM: CT HEAD WITHOUT CONTRAST 05/17/2024 05:42:41 PM TECHNIQUE: CT of the head was performed without the administration of intravenous contrast. Automated exposure control, iterative reconstruction, and/or weight based adjustment of the mA/kV was utilized to reduce the radiation dose to as low as reasonably achievable. COMPARISON: Prior head CT 06/26/2022. CLINICAL HISTORY: 89 year old male with acute neuro deficit, stroke suspected. FINDINGS: Mild motion artifact despite repeated imaging attempts. BRAIN AND VENTRICLES: Brain volume stable from last year and normal for age. No acute hemorrhage. No evidence of acute infarct. No hydrocephalus. No extra-axial collection. No mass effect or midline shift. Gray white differentiation stable and within normal limits for age. Mild motion artifact despite repeated imaging attempts. ORBITS: No acute abnormality. SINUSES: Visible paranasal sinuses, middle ears and mastoids  remain clear. SOFT TISSUES AND SKULL: No acute soft tissue abnormality. No skull fracture. Mild for age calcified atherosclerosis at the skull base. IMPRESSION: 1. Mildly motion degraded despite repeated imaging attempts. 2. No acute intracranial abnormality. Electronically signed by: Helayne Hurst MD 05/17/2024 06:02 PM EST RP Workstation: [id number]    ECHO as above  TELEMETRY reviewed by me [facility]) 05/20/2024 : atrial fibrillation rate 50-60s  EKG reviewed by me: Afib SVR  Data reviewed by me [facility]) 05/20/2024: last 24h vitals tele labs imaging I/O provider notes  Principal Problem:   Atrial fibrillation with slow  ventricular response (HCC) Active Problems:   Acute exacerbation of CHF (congestive heart failure) (HCC)   CAD (coronary artery disease)   HTN (hypertension)   Chronic diastolic CHF (congestive heart failure) (HCC)   Iron  deficiency anemia   Myocardial injury   UTI (urinary tract infection)   Hallucination   Chronic kidney disease, stage 3b (HCC)   Pain of left heel and left hip   BPH (benign prostatic hyperplasia)   Hypothermia    ASSESSMENT AND PLAN:   # Afib with slow ventricular response # Newly diagnosed atrial fibrillation # Acute heart failure preserved EF -Defer AV nodal blocking agents. Currently not requiring anything for rate control. -Eliquis dosing by pharmacy for Afib - currently on reduced dose due to Cr and age. -Transition to PO lasix  40 mg daily. -Troponin mildly elevated, not consistent with ACS.  This patient's plan of care was discussed and created with Dr. Florencio and he is in agreement.    Signed: Danita Bloch, PA-C 05/20/2024, 9:21 AM [facility] Cardiology

## 2024-05-20 NOTE — Assessment & Plan Note (Signed)
 Not in acute exacerbation Furosemide  40 mg daily

## 2024-05-20 NOTE — Telephone Encounter (Signed)
 Patient Product/process development scientist completed.    The patient is insured through [employer]/ [employer]. Patient has Medicare and is not eligible for a copay card, but may be able to apply for patient assistance or Medicare RX Payment Plan (Patient Must reach out to their plan, if eligible for payment plan), if available.    Ran test claim for Eliquis 2.5 mg and the current 30 day co-pay is $47.00.   This test claim was processed through  [facility]- copay amounts may vary at other pharmacies due to pharmacy/plan contracts, or as the patient moves through the different stages of their insurance plan.     Reyes Sharps, CPHT Pharmacy Technician Patient Advocate Specialist Lead [facility] Patient Advocate Team Direct Number: [phone]  Fax: [phone]

## 2024-05-20 NOTE — Care Management Important Message (Signed)
 Important Message  Patient Details  Name: Jerome [name]. MRN: [id number] Date of Birth: 16-Jan-1935   Important Message Given:  Yes - Medicare IM     Ann Groeneveld W, CMA 05/20/2024, 1:03 PM

## 2024-05-20 NOTE — Progress Notes (Signed)
 Daily Progress Note   Patient Name: Jerome [name].       Date: 05/20/2024 DOB: 1935/03/12  Age: 89 y.o. MRN#: [id number] Attending Physician: Sherre Greig SAILOR, DO Primary Care Physician: Diedra Lame, MD Admit Date: 05/17/2024  Reason for Consultation/Follow-up: Establishing goals of care  Subjective: Into see patient.  He is currently resting in bed with Jerome [name], the friend who comes to help him at bedside.  He provides permission to talk with her.  She discusses that at he is able to stand and pivot, but is otherwise wheelchair-bound.  She discusses that he is not able to cook but is able to warm food in the microwave.  She discusses that his care and need for assistance has increased.  She advises that he does not complain of pain, but is weak and frail.  Patient agrees to skilled nursing, and Jerome [name] discusses that depending on how things go with skilled nursing they will talk further about what his needs may be.  She states she believes she is aware of his wishes and goals of care, though she states they do need to discuss them further.  She states she is aware that he wants to live as long as he is able to.  She states she is aware he would want CPR, and temporary ventilator support.  Patient is unsure about the length of time he would want to live on life support.  They would like to continue to discuss amongst themselves.   They discuss the patient's family dynamics.  Jerome [name] discusses that the patient's grandson has autism and sometimes has difficulty with being out of his regimen or making decisions out of his regimen.  She states he just turned 89 years old.  She discusses the grandson's mother who is patient's deceased sons late significant other.  Jerome [name] discusses the dynamics  between patient and grandson's mother, and they both voice concern for possible attempts at affecting the grandson's decisions should he be required to make decisions on patient's behalf.    Patient would like to complete healthcare power of attorney forms for Jerome [name] to be his surrogate management consultant.  The document was left at bedside and was completed.  Chaplain service consulted for notary relation.  Length of Stay: 3  Current Medications: Scheduled Meds:   apixaban  2.5  mg Oral BID   furosemide   40 mg Oral Daily   tamsulosin   0.4 mg Oral q1800   venlafaxine XR  75 mg Oral Daily    Continuous Infusions:  cefTRIAXone  (ROCEPHIN )  IV 1 g (05/19/24 2231)    PRN Meds: acetaminophen , hydrALAZINE , methocarbamol, ondansetron  (ZOFRAN ) IV  Physical Exam Pulmonary:     Effort: Pulmonary effort is normal.  Skin:    General: Skin is warm and dry.  Neurological:     Mental Status: He is alert.             Vital Signs: BP (!) 148/73 (BP Location: Left Arm)   Pulse 67   Temp 98 F (36.7 C)   Resp 18   Ht 5' 9 (1.753 m)   Wt 72.8 kg Comment: bed weight with only pillow and blanket  SpO2 100%   BMI 23.70 kg/m  SpO2: SpO2: 100 % O2 Device: O2 Device: Room Air O2 Flow Rate:    Intake/output summary:  Intake/Output Summary (Last 24 hours) at 05/20/2024 1301 Last data filed at 05/20/2024 1200 Gross per 24 hour  Intake --  Output 1100 ml  Net -1100 ml   LBM: Last BM Date : 05/19/24 Baseline Weight: Weight: 72.8 kg (bed weight with only pillow and blanket) Most recent weight: Weight: 72.8 kg (bed weight with only pillow and blanket)    Patient Active Problem List   Diagnosis Date Noted   New onset atrial fibrillation (HCC) 05/17/2024   Myocardial injury 05/17/2024   UTI (urinary tract infection) 05/17/2024   Hallucination 05/17/2024   Chronic kidney disease, stage 3b (HCC) 05/17/2024   Pain of left heel and left hip 05/17/2024   Atrial fibrillation with slow ventricular  response (HCC) 05/17/2024   BPH (benign prostatic hyperplasia) 05/17/2024   Hypothermia 05/17/2024   Iron  deficiency anemia 06/30/2022   Pressure ulcer of left heel, unstageable (HCC) 06/29/2022   Chronic diastolic CHF (congestive heart failure) (HCC) 06/29/2022   Anemia of chronic disease 06/29/2022   Hyponatremia 06/28/2022   Pressure injury of skin 06/28/2022   Overweight 06/28/2022   Syncope due to orthostatic hypotension 06/28/2022   Syncope 06/26/2022   Hx of total hip arthroplasty, right 01/21/2020   Sepsis (HCC) 03/20/2019   CAD (coronary artery disease) 03/20/2019   HTN (hypertension) 03/20/2019   Acute on chronic renal failure 03/20/2019   Severe sepsis (HCC) 03/20/2019   Right leg pain 12/13/2018   Acute exacerbation of CHF (congestive heart failure) (HCC) 12/09/2018   Benign essential hypertension 01/12/2014   Gout 01/12/2014    Palliative Care Assessment & Plan    Recommendations/Plan: Full code and full scope  Patient would like to complete HCPOA forms for his friend Jerome [name] to be his surrogate management consultant.  The forms have been completed and spiritual care was consulted to assist with motorization.     Code Status Orders  (From admission, onward)           Start     Ordered   05/17/24 2054  Full code  Continuous       Question:  By:  Answer:  Consent: discussion documented in EHR   05/17/24 2055           Code Status History     Date Active Date Inactive Code Status Order ID Comments User Context   06/26/2022 1843 07/03/2022 1902 Full Code [id number]  Sherre Greig SAILOR, DO ED   01/21/2020 1411 01/24/2020 2233 Full Code [id number]  Mardee Agent  SHAUNNA, MD Inpatient   03/21/2019 0326 03/25/2019 1943 Full Code [id number]  Jenel Lenis, MD Inpatient   12/09/2018 0153 12/12/2018 2051 Full Code [id number]  Darl Jon DEL, NP ED      Camelia Lewis, NP  Please contact Palliative Medicine Team phone at [phone] for questions and concerns.

## 2024-05-20 NOTE — Progress Notes (Signed)
 PROGRESS NOTE  Jerome [name] Tod Raddle.  FMW:[id number] DOB: 09-02-1934 DOA: 05/17/2024 PCP: Diedra Lame, MD   [name] is an 89 year old male with history of hypertension, depression, anxiety, BPH, CAD, gout, presents to emergency department for chief concerns of increased urinary frequency, left heel pain, left hip pain, and hallucination.  11/29: Presented to the ED.  11/29: Patient admitted to hospital service for A-fib with slow ventricular response.  Patient also treated for UTI with Rocephin .  12/2: Assumed care of the patient.  Pending SNF.  IV Lasix  has been switched to p.o. today.  Assessment & Plan:   Principal Problem:   Atrial fibrillation with slow ventricular response (HCC) Active Problems:   CAD (coronary artery disease)   Chronic diastolic CHF (congestive heart failure) (HCC)   HTN (hypertension)   Iron  deficiency anemia   UTI (urinary tract infection)   Hypothermia   Chronic kidney disease, stage 3b (HCC)   Pain of left heel and left hip   BPH (benign prostatic hyperplasia)   Hallucination   Acute exacerbation of CHF (congestive heart failure) (HCC)   Depression   Assessment and Plan:  * Atrial fibrillation with slow ventricular response (HCC) Complete echo showed estimated ejection fraction is 50 to 55%  Chronic diastolic CHF (congestive heart failure) (HCC) Not in acute exacerbation Furosemide  40 mg daily  HTN (hypertension) Furosemide  40 mg daily  Hypothermia Resolved  UTI (urinary tract infection) 12/2: Ceftriaxone , day 3 of antibiotic  Hallucination Resolved  Depression Venlafaxine 75 mg daily  DVT prophylaxis:  Code Status:  Family Communication:  Disposition Plan:  Level of care: Telemetry  Consultants:  PT, OT, TOC  Procedures:  None indicated at this time  Antimicrobials: Ceftriaxone  1 g IV daily  Subjective:  At bedside, patient patient was able to tell me his first left name, age,  location.  Objective: Vitals:   05/20/24 0335 05/20/24 0340 05/20/24 0859 05/20/24 1239  BP: (!) 110/59  138/76 (!) 148/73  Pulse: (!) 58  63 67  Resp: 18  18 18   Temp: 98 F (36.7 C)  97.8 F (36.6 C) 98 F (36.7 C)  TempSrc:      SpO2: 93%  94% 100%  Weight:  72.8 kg    Height:        Intake/Output Summary (Last 24 hours) at 05/20/2024 1742 Last data filed at 05/20/2024 1200 Gross per 24 hour  Intake --  Output 1100 ml  Net -1100 ml   Filed Weights   05/20/24 0340  Weight: 72.8 kg    Examination:  General exam: Appears calm and comfortable  Respiratory system: Clear to auscultation. Respiratory effort normal. Cardiovascular system: S1 & S2 heard, RRR. No JVD, murmurs, rubs, gallops or clicks. No pedal edema. Gastrointestinal system: Abdomen is nondistended, soft and nontender. No organomegaly or masses felt. Normal bowel sounds heard. Central nervous system: Alert and oriented. No focal neurological deficits. Extremities: Symmetric 5 x 5 power. Skin: No rashes, lesions or ulcers Psychiatry: Judgement and insight appear normal. Mood & affect appropriate.   Data Reviewed: I have personally reviewed following labs and imaging studies  CBC: Recent Labs  Lab 05/17/24 1538 05/18/24 0303  WBC 6.3 5.2  HGB 10.4* 9.2*  HCT 33.1* 30.0*  MCV 97.6 97.4  PLT 191 182   Basic Metabolic Panel: Recent Labs  Lab 05/17/24 1538 05/17/24 2055 05/18/24 0303 05/19/24 0424 05/20/24 0443  NA 139  --  140 139 136  K 4.7  --  4.3  4.4 4.0  CL 105  --  105 101 97*  CO2 24  --  25 29 31   GLUCOSE 86  --  64* 74 94  BUN 35*  --  34* 34* 33*  CREATININE 1.57*  --  1.57* 1.75* 1.84*  CALCIUM 8.9  --  8.7* 8.5* 8.5*  MG  --  2.2  --   --   --    GFR: Estimated Creatinine Clearance: 28.3 mL/min (A) (by C-G formula based on SCr of 1.84 mg/dL (H)).  Liver Function Tests: Recent Labs  Lab 05/17/24 1538  AST 23  ALT 13  ALKPHOS 87  BILITOT 0.5  PROT 7.5  ALBUMIN 3.1*    Coagulation Profile: Recent Labs  Lab 05/18/24 0303  INR 1.3*   Cardiac Enzymes: Recent Labs  Lab 05/17/24 2054  CKTOTAL 48*   BNP (last 3 results) Recent Labs    05/17/24 2014  PROBNP 15,434.0*   CBG: Recent Labs  Lab 05/17/24 1931 05/18/24 0444  GLUCAP 93 115*   Thyroid Function Tests: Recent Labs    05/17/24 2054  TSH 8.430*  FREET4 1.07   Sepsis Labs: Recent Labs  Lab 05/17/24 1538  LATICACIDVEN 1.9   Recent Results (from the past 240 hours)  Urine Culture (for pregnant, neutropenic or urologic patients or patients with an indwelling urinary catheter)     Status: Abnormal   Collection Time: 05/17/24  8:49 PM   Specimen: Urine, Random  Result Value Ref Range Status   Specimen Description   Final    URINE, RANDOM Performed at [facility], [address], [place], [place] [address]    Special Requests   Final    NONE Performed at [facility], [address], [place], [place] [address]    Culture MULTIPLE SPECIES PRESENT, SUGGEST RECOLLECTION (A)  Final   Report Status 05/19/2024 FINAL  Final    Scheduled Meds:  apixaban   2.5 mg Oral BID   furosemide   40 mg Oral Daily   tamsulosin   0.4 mg Oral q1800   venlafaxine  XR  75 mg Oral Daily   Continuous Infusions:  cefTRIAXone  (ROCEPHIN )  IV 1 g (05/19/24 2231)     LOS: 3 days   Time spent: 35 minutes  Dr. Sherre [facility] If 7PM-7AM, please contact night-coverage 05/20/2024, 5:42 PM

## 2024-05-20 NOTE — Assessment & Plan Note (Addendum)
 Patient completed antibiotic therapy with ceftriaxone  with good toleration

## 2024-05-20 NOTE — Assessment & Plan Note (Signed)
Venlafaxine 75 mg daily

## 2024-05-20 NOTE — Progress Notes (Signed)
   05/20/24 1600  Spiritual Encounters  Type of Visit Follow up  Care provided to: Patient  Referral source Chaplain team;Other (comment) Dorothye Harari)  Reason for visit Advance directives  OnCall Visit No  Spiritual Framework  Presenting Themes Meaning/purpose/sources of inspiration;Goals in life/care;Values and beliefs  Values/beliefs Pt is a Christian  Interventions  Spiritual Care Interventions Made Established relationship of care and support;Compassionate presence;Reflective listening;Encouragement  Intervention Outcomes  Outcomes Connection to spiritual care;Awareness around self/spiritual resourses;Connection to values and goals of care;Awareness of support  Advance Directives (For Healthcare)  Does Patient Have a Medical Advance Directive? No  Would patient like information on creating a medical advance directive? Yes (Inpatient - patient defers creating a medical advance directive at this time - Information given) (Pt has information but wants tohave time to read the document wait on his caregiver to be here before he can finish filling out the information)   Pt will ask the nurse to page the Chaplain when he's ready to have the completed AD notarized.

## 2024-05-20 NOTE — Assessment & Plan Note (Signed)
 Patient with controlled atrial fibrillation.  Continue anticoagulation with apixaban.  Ok to discontinue telemetry .  Plan to follow up as outpatient.

## 2024-05-20 NOTE — TOC Progression Note (Signed)
 Transition of Care [facility]) - Progression Note    Patient Details  Name: Jerome [name]. MRN: [id number] Date of Birth: April 12, 1935  Transition of Care [facility]) CM/SW Contact  Shasta DELENA Daring, RN Phone Number: 05/20/2024, 12:59 PM  Clinical Narrative:     Advised patient that [facility] did not offer a bed and that we needed to widen the search. Patient was amenable to other facilities and said he had reconsidered and would be willing to go to [facility] if a bed is available.   Initiated search. Will follow.  Expected Discharge Plan: Skilled Nursing Facility                 Expected Discharge Plan and Services       Living arrangements for the past 2 months: Single Family Home                                       Social Drivers of Health (SDOH) Interventions SDOH Screenings   Food Insecurity: Patient Declined (05/01/2024)   Received from [facility]  Housing: Patient Declined (05/01/2024)   Received from [facility]  Transportation Needs: Patient Declined (05/01/2024)   Received from [facility]  Utilities: Patient Declined (05/01/2024)   Received from [facility]  Financial Resource Strain: Patient Declined (05/01/2024)   Received from [facility]  Tobacco Use: Low Risk  (05/17/2024)    Readmission Risk Interventions     No data to display

## 2024-05-20 NOTE — Hospital Course (Addendum)
 [name] is an 89 year old male with history of hypertension, depression, anxiety, BPH, CAD, gout, presents to emergency department for chief concerns of increased urinary frequency, left heel pain, left hip pain, and hallucination.  11/29: Presented to the ED.  11/29: Patient admitted to hospital service for A-fib with slow ventricular response.  Patient also treated for UTI with Rocephin .  12/2: Assumed care of the patient.  Pending SNF.  IV Lasix  has been switched to p.o. today.

## 2024-05-20 NOTE — Assessment & Plan Note (Signed)
 Continue losartan  for blood pressure control.

## 2024-05-21 DIAGNOSIS — Z515 Encounter for palliative care: Secondary | ICD-10-CM

## 2024-05-21 LAB — BASIC METABOLIC PANEL WITH GFR
Anion gap: 8 (ref 5–15)
BUN: 33 mg/dL — ABNORMAL HIGH (ref 8–23)
CO2: 32 mmol/L (ref 22–32)
Calcium: 8.5 mg/dL — ABNORMAL LOW (ref 8.9–10.3)
Chloride: 96 mmol/L — ABNORMAL LOW (ref 98–111)
Creatinine, Ser: 1.7 mg/dL — ABNORMAL HIGH (ref 0.61–1.24)
GFR, Estimated: 39 mL/min — ABNORMAL LOW (ref >60)
Glucose, Bld: 97 mg/dL (ref 70–99)
Potassium: 4.1 mmol/L (ref 3.5–5.1)
Sodium: 136 mmol/L (ref 135–145)

## 2024-05-21 LAB — CBC
HCT: 31.4 % — ABNORMAL LOW (ref 39.0–52.0)
Hemoglobin: 10.1 g/dL — ABNORMAL LOW (ref 13.0–17.0)
MCH: 30.3 pg (ref 26.0–34.0)
MCHC: 32.2 g/dL (ref 30.0–36.0)
MCV: 94.3 fL (ref 80.0–100.0)
Platelets: 210 K/uL (ref 150–400)
RBC: 3.33 MIL/uL — ABNORMAL LOW (ref 4.22–5.81)
RDW: 16.9 % — ABNORMAL HIGH (ref 11.5–15.5)
WBC: 8.8 K/uL (ref 4.0–10.5)
nRBC: 0 % (ref 0.0–0.2)

## 2024-05-21 LAB — GLUCOSE, CAPILLARY: Glucose-Capillary: 116 mg/dL — ABNORMAL HIGH (ref 70–99)

## 2024-05-21 NOTE — Progress Notes (Signed)
 PROGRESS NOTE   HPI was taken from Dr. Hilma: Jerome [name] Jerome [name]. is a 89 y.o. male with medical history significant of HTN, CAD, dCHF, gout, depression with anxiety, BPH, CKD-3B, iron  deficiency anemia, who presents with hallucination, increased urinary frequency, pain in left heel and the left hip.   Per report, pt has hallucination for almost 2 weeks. He sees things and hears things which are not there.  Patient is oriented x 3 during the interview.  Patient complaints of pain in whole left leg, but on my examination, patient seems to have pain in left heel and left hip. He denies any fall or injury.  Patient does not have chest pain, cough, SOB.  No nausea, vomiting, diarrhea or abdominal pain.  He reports increased urinary frequency. He denies dysuria or burning on urination.  No hematuria.  No fever or chills. He has bilateral lower leg edema. He has some bruises in both arms.    Per report, pt lives alone. His son was POA until he passed away. Neighbors check in on him periodically. Per ED RN's report, neighbor reported that pt is bed bound mostly, and can stand with walker. Pt is scared at home     Data reviewed independently and ED Course: pt was found to have WBC 6.3, positive UA (hazy appearance, large amount of leukocyte, positive nitrite, many bacteria, WBC> 50), proBNP 150434, troponin 85, stable renal function.  Temperature 95.1, blood pressure 163/86, heart rate 40-60s, RR 16, oxygen SAT 92 and-98% on room air.  CT head negative for acute intracranial abnormalities.  Patient is admitted to PCU as inpatient. Dr. Rayfield of [facility] card is consulted.   Jerome [name] Jerome [name].  FMW:[id number] DOB: 02-24-1935 DOA: 05/17/2024 PCP: Diedra Lame, MD    Assessment & Plan:   Principal Problem:   Atrial fibrillation with slow ventricular response (HCC) Active Problems:   CAD (coronary artery disease)   Chronic diastolic CHF (congestive heart failure) (HCC)   HTN (hypertension)   Iron   deficiency anemia   UTI (urinary tract infection)   Hypothermia   Chronic kidney disease, stage 3b (HCC)   Pain of left heel and left hip   BPH (benign prostatic hyperplasia)   Hallucination   Acute exacerbation of CHF (congestive heart failure) (HCC)   Depression  Assessment and Plan: A. fib: new onset. W/ slow ventricular response. Continue on eliquis . Not on any rate controlling meds. No further recs inpatient as per cardio. Will need to f/u outpatient w/ cardio, Dr. Dewane in 1-2 weeks    Generalized weakness: PT/OT recs SNF. Pt is agreeable to go to SNF  Chronic diastolic CHF: continue on lasix . Monitor I/Os. Appears compensated  CKDIIIb: Cr is trending down from day prior. Avoid nephrotoxic meds  HTN: no longer taking amlodipine  as per med rec. Holding home coreg  b/c HR in 50s.    Hypothermia: resolved   UTI: urine cx shows containment. Will continue on IV rocephin  to complete the course of abxs   Hallucination: resolved   Depression: severity unknown. Continue on home dose of venlafaxine     BPH: continue on home dose of flomax       DVT prophylaxis: eliquis  Code Status: full  Family Communication: discussed pt's care w/ pt's family at bedside and answered their questions  Disposition Plan: likely d/c to SNF  Level of care: Telemetry  Status is: Inpatient Remains inpatient appropriate because: requiring IV abxs & needs SNF placement    Consultants:  Cardio   Procedures:  Antimicrobials: rocpehin   Subjective: Pt c/o malaise  Objective: Vitals:   05/20/24 2349 05/21/24 0440 05/21/24 0450 05/21/24 0737  BP: 129/60  136/60 119/64  Pulse: 63  63 (!) 53  Resp: 16  16 17   Temp: 98.4 F (36.9 C)  (!) 97.2 F (36.2 C) 98.1 F (36.7 C)  TempSrc:      SpO2: 94%  92% 98%  Weight:  75.6 kg    Height:        Intake/Output Summary (Last 24 hours) at 05/21/2024 0917 Last data filed at 05/21/2024 0450 Gross per 24 hour  Intake --  Output 3100 ml   Net -3100 ml   Filed Weights   05/20/24 0340 05/21/24 0440  Weight: 72.8 kg 75.6 kg    Examination:  General exam: Appears calm and comfortable  Respiratory system: Clear to auscultation. Respiratory effort normal. Cardiovascular system: S1 & S2+. No rubs, gallops or clicks.  Gastrointestinal system: Abdomen is nondistended, soft and nontender. Normal bowel sounds heard. Central nervous system: Alert and oriented. Moves all extremities Psychiatry: Judgement and insight appears at baseline. Flat mood and affect   Data Reviewed: I have personally reviewed following labs and imaging studies  CBC: Recent Labs  Lab 05/17/24 1538 05/18/24 0303 05/21/24 0416  WBC 6.3 5.2 8.8  HGB 10.4* 9.2* 10.1*  HCT 33.1* 30.0* 31.4*  MCV 97.6 97.4 94.3  PLT 191 182 210   Basic Metabolic Panel: Recent Labs  Lab 05/17/24 1538 05/17/24 2055 05/18/24 0303 05/19/24 0424 05/20/24 0443 05/21/24 0416  NA 139  --  140 139 136 136  K 4.7  --  4.3 4.4 4.0 4.1  CL 105  --  105 101 97* 96*  CO2 24  --  25 29 31  32  GLUCOSE 86  --  64* 74 94 97  BUN 35*  --  34* 34* 33* 33*  CREATININE 1.57*  --  1.57* 1.75* 1.84* 1.70*  CALCIUM 8.9  --  8.7* 8.5* 8.5* 8.5*  MG  --  2.2  --   --   --   --    GFR: Estimated Creatinine Clearance: 30.6 mL/min (A) (by C-G formula based on SCr of 1.7 mg/dL (H)). Liver Function Tests: Recent Labs  Lab 05/17/24 1538  AST 23  ALT 13  ALKPHOS 87  BILITOT 0.5  PROT 7.5  ALBUMIN 3.1*   No results for input(s): LIPASE, AMYLASE in the last 168 hours. No results for input(s): AMMONIA in the last 168 hours. Coagulation Profile: Recent Labs  Lab 05/18/24 0303  INR 1.3*   Cardiac Enzymes: Recent Labs  Lab 05/17/24 2054  CKTOTAL 48*   BNP (last 3 results) Recent Labs    05/17/24 2014  PROBNP 15,434.0*   HbA1C: No results for input(s): HGBA1C in the last 72 hours. CBG: Recent Labs  Lab 05/17/24 1931 05/18/24 0444  GLUCAP 93 115*    Lipid Profile: No results for input(s): CHOL, HDL, LDLCALC, TRIG, CHOLHDL, LDLDIRECT in the last 72 hours. Thyroid Function Tests: No results for input(s): TSH, T4TOTAL, FREET4, T3FREE, THYROIDAB in the last 72 hours. Anemia Panel: No results for input(s): VITAMINB12, FOLATE, FERRITIN, TIBC, IRON , RETICCTPCT in the last 72 hours. Sepsis Labs: Recent Labs  Lab 05/17/24 1538  LATICACIDVEN 1.9    Recent Results (from the past 240 hours)  Urine Culture (for pregnant, neutropenic or urologic patients or patients with an indwelling urinary catheter)     Status: Abnormal   Collection Time: 05/17/24  8:49 PM   Specimen: Urine, Random  Result Value Ref Range Status   Specimen Description   Final    URINE, RANDOM Performed at [facility], [address] [place], [place] [address]    Special Requests   Final    NONE Performed at [facility], [address] [place], [place] [address]    Culture MULTIPLE SPECIES PRESENT, SUGGEST RECOLLECTION (A)  Final   Report Status 05/19/2024 FINAL  Final         Radiology Studies: No results found.      Scheduled Meds:  apixaban  2.5 mg Oral BID   furosemide   40 mg Oral Daily   tamsulosin   0.4 mg Oral q1800   venlafaxine XR  75 mg Oral Daily   Continuous Infusions:  cefTRIAXone  (ROCEPHIN )  IV 1 g (05/19/24 2231)     LOS: 4 days       Anthony CHRISTELLA Pouch, MD [facility] Pager [phone]  If 7PM-7AM, please contact night-coverage [email or web address] 05/21/2024, 9:17 AM

## 2024-05-21 NOTE — Progress Notes (Signed)
 Physical Therapy Treatment Patient Details Name: Jerome [name]. MRN: [id number] DOB: 03-Mar-1935 Today's Date: 05/21/2024   History of Present Illness Jerome [name]. is a 89 y.o. male with medical history significant of HTN, CAD, dCHF, gout, depression with anxiety, BPH, CKD-3B, iron  deficiency anemia, who presents with hallucination, increased urinary frequency, pain in left heel and the left hip.    PT Comments  Pt received upright in bed agreeable to PT. Pt with notable limitations in BUE ROM at the shoulder leading to maxA+1 and bed features to sit EOB. Pt with purewick malfunction lending to removal of soiled chuck in sitting with L lateral lean. Unable to return to sitting without maxA from PT. MinA needed for x3 L lateral scoots EOB with fair tolerance to mobilize body. Pt is able to stand modA+1 with bed elevated to RW and take minimal L lateral steps with heavy BUE support needed on RW prior to return to sitting. minA+1 at LE's to return to supine with all needs in reach. Pt remains heavily dependent on single person assist for mobility. D/c recs remain appropriate.     If plan is discharge home, recommend the following: Two people to help with walking and/or transfers;A lot of help with bathing/dressing/bathroom   Can travel by private vehicle     No  Equipment Recommendations       Recommendations for Other Services       Precautions / Restrictions Precautions Precautions: Fall Recall of Precautions/Restrictions: Intact Restrictions Weight Bearing Restrictions Per Provider Order: No     Mobility  Bed Mobility Overal bed mobility: Needs Assistance Bed Mobility: Supine to Sit, Sit to Supine     Supine to sit: Max assist, HOB elevated, Used rails Sit to supine: Min assist, Used rails     Patient Response: Cooperative  Transfers Overall transfer level: Needs assistance Equipment used: Rolling walker (2 wheels) Transfers: Sit to/from Stand Sit to  Stand: Mod assist, From elevated surface           General transfer comment: using RW. Limited L knee extension in standing. ABle to side step x2 to the L towards HOB. Very effortful for pt.    Ambulation/Gait                   Stairs             Wheelchair Mobility     Tilt Bed Tilt Bed Patient Response: Cooperative  Modified Rankin (Stroke Patients Only)       Balance Overall balance assessment: Needs assistance Sitting-balance support: Feet supported Sitting balance-Leahy Scale: Fair     Standing balance support: Bilateral upper extremity supported, Reliant on assistive device for balance Standing balance-Leahy Scale: Poor Standing balance comment: heavy BUE support on RW                            Communication Communication Communication: No apparent difficulties  Cognition Arousal: Alert Behavior During Therapy: WFL for tasks assessed/performed   PT - Cognitive impairments: No apparent impairments                         Following commands: Intact      Cueing Cueing Techniques: Verbal cues  Exercises      General Comments        Pertinent Vitals/Pain Pain Assessment Pain Assessment: Faces Faces Pain Scale: Hurts a little bit Pain Location: L  knee Pain Descriptors / Indicators: Discomfort, Sore Pain Intervention(s): Limited activity within patient's tolerance, Monitored during session, Repositioned    Home Living                          Prior Function            PT Goals (current goals can now be found in the care plan section) Acute Rehab PT Goals Patient Stated Goal: patient open to rehab PT Goal Formulation: With patient Time For Goal Achievement: 06/01/24 Potential to Achieve Goals: Good Progress towards PT goals: Progressing toward goals    Frequency    Min 2X/week      PT Plan      Co-evaluation              AM-PAC PT 6 Clicks Mobility   Outcome Measure  Help  needed turning from your back to your side while in a flat bed without using bedrails?: A Lot Help needed moving from lying on your back to sitting on the side of a flat bed without using bedrails?: A Lot Help needed moving to and from a bed to a chair (including a wheelchair)?: Total Help needed standing up from a chair using your arms (e.g., wheelchair or bedside chair)?: A Lot Help needed to walk in hospital room?: Total Help needed climbing 3-5 steps with a railing? : Total 6 Click Score: 9    End of Session Equipment Utilized During Treatment: Gait belt Activity Tolerance: Patient tolerated treatment well Patient left: in bed;with call bell/phone within reach;with bed alarm set Nurse Communication: Mobility status PT Visit Diagnosis: Other abnormalities of gait and mobility (R26.89);Muscle weakness (generalized) (M62.81);Unsteadiness on feet (R26.81);Pain Pain - Right/Left: Left Pain - part of body: Leg     Time: 8567-8546 PT Time Calculation (min) (ACUTE ONLY): 21 min  Charges:    $Therapeutic Activity: 8-22 mins PT General Charges $$ ACUTE PT VISIT: 1 Visit                     Dorina HERO. Fairly IV, PT, DPT Physical Therapist- [place]  [facility] 05/21/2024, 3:20 PM

## 2024-05-21 NOTE — Progress Notes (Signed)
   05/21/24 1500  Spiritual Encounters  Type of Visit Follow up  Care provided to: Patient  Referral source Chaplain team  Reason for visit Advance directives  OnCall Visit No  Advance Directives (For Healthcare)  Does Patient Have a Medical Advance Directive? No  Would patient like information on creating a medical advance directive? Yes (Inpatient - patient defers creating a medical advance directive at this time - Information given)  Mental Health Advance Directives  Does Patient Have a Mental Health Advance Directive? No  Would patient like information on creating a mental health advance directive? No - Patient declined   Chaplain unable to secure notary after 4 calls. No responses. Will circle back at later point to see if patient still desires document to be completed.   Esly Selvage Chaplain Intern-[facility]

## 2024-05-21 NOTE — Progress Notes (Signed)
 Daily Progress Note   Patient Name: Jerome [name]       Date: 05/21/2024 DOB: 06/11/1946  Age: 89 y.o. MRN#: [id number] Attending Physician: Al-Sultani, Anmar, MD Primary Care Physician: Tobie Domino, MD Admit Date: 05/09/2024  Reason for Consultation/Follow-up: Establishing goals of care  Subjective: Notes and labs reviewed.  In to see patient.  He is currently sitting in bed at this time with his male friend/caregiver at bedside.  She discusses that the [facility] portion of the forms was completed yesterday, and a that is notarized.  She again discusses the family dynamics with the patient's son asked significant other grandson.  Patient is in agreement with all of this.  Friend states they have talked further regarding any limits to care.  Currently patient would like to continue full code and full scope treatment.  Length of Stay: 11  Current Medications: Scheduled Meds:   vitamin C   500 mg Per Tube BID   aspirin   81 mg Per NG tube Daily   atorvastatin  80 mg Per NG tube Daily   carvedilol   12.5 mg Per NG tube BID WC   collagenase   Topical Daily   cyanocobalamin   1,000 mcg Intramuscular Q0600   feeding supplement (PROSource TF20)  60 mL Per Tube Daily   fiber supplement (BANATROL TF)  60 mL Per Tube BID   free water  200 mL Per Tube Q4H   insulin aspart  0-15 Units Subcutaneous Q4H   latanoprost  1 drop Both Eyes QHS   losartan  25 mg Per Tube Daily    morphine injection  2 mg Intravenous Once   multivitamin with minerals  1 tablet Per Tube Daily   mouth rinse  15 mL Mouth Rinse 4 times per day   zinc sulfate (50mg  elemental zinc)  220 mg Per Tube Daily    Continuous Infusions:  feeding supplement (OSMOLITE 1.5 CAL) Stopped (05/21/24 0000)   meropenem (MERREM) IV 1 g (05/21/24  0547)   vancomycin  1,500 mg (05/21/24 1047)    PRN Meds: acetaminophen  **OR** acetaminophen  (TYLENOL ) oral liquid 160 mg/5 mL **OR** acetaminophen , ibuprofen, ipratropium-albuterol, lip balm, morphine injection, mouth rinse  Physical Exam          Vital Signs: BP (!) 130/52 (BP Location: Right Arm)   Pulse 91   Temp 99.9  F (37.7 C) (Axillary)   Resp (!) 29   Ht 5' 5 (1.651 m)   Wt 85.8 kg   SpO2 98%   BMI 31.48 kg/m  SpO2: SpO2: 98 % O2 Device: O2 Device: Nasal Cannula O2 Flow Rate: O2 Flow Rate (L/min): 1 L/min  Intake/output summary:  Intake/Output Summary (Last 24 hours) at 05/21/2024 1255 Last data filed at 05/21/2024 0800 Gross per 24 hour  Intake 660 ml  Output 1550 ml  Net -890 ml   LBM: Last BM Date : 05/20/24 Baseline Weight: Weight: 83.5 kg Most recent weight: Weight:  (cant get weight on bed)       Patient Active Problem List   Diagnosis Date Noted   Fever 05/20/2024   Severe aortic stenosis 05/11/2024   Pneumonitis 05/11/2024   AKI (acute kidney injury) 05/10/2024   Hypernatremia 05/10/2024   Abnormal EKG 05/10/2024   SIRS (systemic inflammatory response syndrome) (HCC) 05/10/2024   Acute CVA (cerebrovascular accident) (HCC) 05/09/2024   BP (high blood pressure) 11/24/2014   Asthma, chronic 11/24/2014   Essential hypertension 11/24/2014   Obesity 11/24/2014   Sleep apnea 11/24/2014    Palliative Care Assessment & Plan   Goals of Care Discussion: 05/20/24  Recommendations/Plan: Full code full scope Working towards SNF this month Spiritual care reconsulted for completion of HPOA document PMT will shadow for needs.    Code Status:    Code Status Orders  (From admission, onward)           Start     Ordered   05/11/24 1605  Do not attempt resuscitation (DNR) Pre-Arrest Interventions Desired  (Code Status)  Continuous       Question Answer Comment  If pulseless and not breathing No CPR or chest compressions.   In Pre-Arrest  Conditions (Patient Has Pulse and Is Breathing) May intubate, use advanced airway interventions and cardioversion/ACLS medications if appropriate or indicated. May transfer to ICU.   Consent: Discussion documented in EHR or advanced directives reviewed      05/11/24 1604           Code Status History     Date Active Date Inactive Code Status Order ID Comments User Context   05/11/2024 1250 05/11/2024 1604 Full Code [id number]  Jens Durand, MD Inpatient   05/10/2024 1558 05/11/2024 1250 Limited: Do not attempt resuscitation (DNR) -DNR-LIMITED -Do Not Intubate/DNI  [id number]  Jens Durand, MD Inpatient   05/10/2024 0059 05/10/2024 1558 Full Code [id number]  Cleatus Delayne GAILS, MD Inpatient      Camelia Lewis, NP  Please contact Palliative Medicine Team phone at [phone] for questions and concerns.

## 2024-05-21 NOTE — TOC Progression Note (Signed)
 Transition of Care [facility]) - Progression Note    Patient Details  Name: Jerome [name]. MRN: [id number] Date of Birth: Jan 19, 1935  Transition [facility]) CM/SW Contact  Alexius Hangartner  [place], [place] Phone Number: 05/21/2024, 3:06 PM  Clinical Narrative:   Shara started by [employer] for patient to admit to [facility].   TOC to follow for discharge    Expected Discharge Plan: Skilled Nursing Facility                 Expected Discharge Plan and Services       Living arrangements for the past 2 months: Single Family Home                                       Social Drivers of Health (SDOH) Interventions SDOH Screenings   Food Insecurity: No Food Insecurity (05/21/2024)  Housing: Low Risk  (05/21/2024)  Transportation Needs: No Transportation Needs (05/21/2024)  Utilities: Not At Risk (05/21/2024)  Financial Resource Strain: Patient Declined (05/01/2024)   Received from [facility]  Social Connections: Socially Isolated (05/21/2024)  Tobacco Use: Low Risk  (05/17/2024)    Readmission Risk Interventions     No data to display

## 2024-05-21 NOTE — Progress Notes (Signed)
 [facility] CLINIC CARDIOLOGY PROGRESS NOTE       Patient ID: Jerome [name]. MRN: [id number] DOB/AGE: 1934/11/07 89 y.o.  Admit date: 05/17/2024 Referring Physician [facility] Primary Physician Diedra Lame, MD Primary Cardiologist NA Reason for Consultation Afib, CHF  HPI: Jerome Ohlin. is a 89 y.o. male with medical history significant of HTN, CAD, dCHF, gout, depression with anxiety, BPH, CKD-3B, iron  deficiency anemia, who presents with hallucination, increased urinary frequency. Cardiology was placed on consultation for Afib and AoCHF.   Interval history: -Patient seen and examined this AM, resting in bed. -Denies dizziness/lightheadedness. HR controlled.  -Denies SOB. LE edema gradually improving.  Review of systems complete and found to be negative unless listed above     Past Medical History:  Diagnosis Date   Anxiety    Arthritis    CAD (coronary artery disease)    Chronic kidney disease    stage 3   Chronic systolic CHF (congestive heart failure) (HCC)    Depression    Hypertension     Past Surgical History:  Procedure Laterality Date   APPENDECTOMY     HERNIA REPAIR Right    inguinal   JOINT REPLACEMENT Right    knee   TOTAL HIP ARTHROPLASTY Right 01/21/2020   Procedure: TOTAL HIP ARTHROPLASTY;  Surgeon: Mardee Lynwood SQUIBB, MD;  Location: [facility] ORS;  Service: Orthopedics;  Laterality: Right;    Medications Prior to Admission  Medication Sig Dispense Refill Last Dose/Taking   Ascorbic Acid  (VITAMIN C  WITH ROSE HIPS) 500 MG tablet Take 500 mg by mouth in the morning and at bedtime.   Taking   aspirin  EC 81 MG tablet Take 81 mg by mouth daily.   Taking   carvedilol  (COREG ) 6.25 MG tablet Take 1 tablet (6.25 mg total) by mouth 2 (two) times daily with a meal. 60 tablet 1 Taking   oxybutynin (DITROPAN) 5 MG tablet Take 5 mg by mouth at bedtime.   Taking   polyethylene glycol powder ([employer] PURELAX) 17 GM/SCOOP powder Take 17 g by mouth daily as needed for  mild constipation. MIX 17 GRAMS IN 4-8 OUNCES OF FLUID & TAKE DAILY AS NEEDED FOR CONSTIPATION FOR UP TO 30 DAYS   Taking As Needed   tamsulosin  (FLOMAX ) 0.4 MG CAPS capsule Take 0.4 mg by mouth daily at 6 PM.    Taking   venlafaxine  XR (EFFEXOR -XR) 75 MG 24 hr capsule Take 75 mg by mouth daily.   Taking   vitamin B-12 (CYANOCOBALAMIN ) 1000 MCG tablet Take 1,000 mcg by mouth daily.   Taking   amLODipine  (NORVASC ) 2.5 MG tablet Take 2.5 mg by mouth daily. (Patient not taking: Reported on 05/17/2024)   Not Taking   amLODipine  (NORVASC ) 5 MG tablet Take 5 mg by mouth daily. (Patient not taking: Reported on 05/17/2024)   Not Taking   iron  polysaccharides (NIFEREX) 150 MG capsule Take 1 capsule (150 mg total) by mouth daily. (Patient not taking: Reported on 05/17/2024)   Not Taking   Multiple Vitamins-Minerals (MULTIVITAMIN WITH MINERALS) tablet Take 1 tablet by mouth daily. (Patient not taking: Reported on 05/17/2024)   Not Taking   Social History   Socioeconomic History   Marital status: Divorced    Spouse name: Not on file   Number of children: Not on file   Years of education: Not on file   Highest education level: Not on file  Occupational History   Not on file  Tobacco Use   Smoking status: Never  Smokeless tobacco: Never  Vaping Use   Vaping status: Never Used  Substance and Sexual Activity   Alcohol use: Never   Drug use: Never   Sexual activity: Not Currently  Other Topics Concern   Not on file  Social History Narrative   Not on file   Social Drivers of Health   Financial Resource Strain: Patient Declined (05/01/2024)   [facility] from [facility]   Overall Financial Resource Strain (CARDIA)    Difficulty of Paying Living Expenses: Patient declined  Food Insecurity: Patient Declined (05/01/2024)   [facility] from [facility]   Hunger Vital Sign    Within the past 12 months, you worried that your food would run out before you got the money  to buy more.: Patient declined    Within the past 12 months, the food you bought just didn't last and you didn't have money to get more.: Patient declined  Transportation Needs: Patient Declined (05/01/2024)   Received from [facility] - Transportation    In the past 12 months, has lack of transportation kept you from medical appointments or from getting medications?: Patient declined    Lack of Transportation (Non-Medical): Patient declined  Physical Activity: Not on file  Stress: Not on file  Social Connections: Not on file  Intimate Partner Violence: Not At Risk (06/27/2022)   Humiliation, Afraid, Rape, and Kick questionnaire    Fear of Current or Ex-Partner: No    Emotionally Abused: No    Physically Abused: No    Sexually Abused: No    Family History  Problem Relation Age of Onset   Heart failure Son      Vitals:   05/20/24 2349 05/21/24 0440 05/21/24 0450 05/21/24 0737  BP: 129/60  136/60 119/64  Pulse: 63  63 (!) 53  Resp: 16  16 17   Temp: 98.4 F (36.9 C)  (!) 97.2 F (36.2 C) 98.1 F (36.7 C)  TempSrc:      SpO2: 94%  92% 98%  Weight:  75.6 kg    Height:        PHYSICAL EXAM General: well nourished, in no acute distress. HEENT: Normocephalic and atraumatic. Neck: No JVD.  Lungs: Normal respiratory effort. Clear bilaterally to auscultation. No wheezes, crackles, rhonchi.  Heart: HRRR. Normal S1 and S2 without gallops or murmurs.  Abdomen: Non-distended appearing.  Msk: Normal strength and tone for age. Extremities: Warm and well perfused. No clubbing, cyanosis. Trace edema LLE, 1 + pitting RLE.  Neuro: confused  Labs: Basic Metabolic Panel: Recent Labs    05/20/24 0443 05/21/24 0416  NA 136 136  K 4.0 4.1  CL 97* 96*  CO2 31 32  GLUCOSE 94 97  BUN 33* 33*  CREATININE 1.84* 1.70*  CALCIUM 8.5* 8.5*   Liver Function Tests: No results for input(s): AST, ALT, ALKPHOS, BILITOT, PROT, ALBUMIN in the last 72  hours.  No results for input(s): LIPASE, AMYLASE in the last 72 hours. CBC: Recent Labs    05/21/24 0416  WBC 8.8  HGB 10.1*  HCT 31.4*  MCV 94.3  PLT 210   Cardiac Enzymes: No results for input(s): CKTOTAL, CKMB, CKMBINDEX, TROPONINIHS in the last 72 hours.  BNP: No results for input(s): BNP in the last 72 hours. D-Dimer: No results for input(s): DDIMER in the last 72 hours. Hemoglobin A1C: No results for input(s): HGBA1C in the last 72 hours. Fasting Lipid Panel: No results for input(s): CHOL, HDL,  LDLCALC, TRIG, CHOLHDL, LDLDIRECT in the last 72 hours. Thyroid Function Tests: No results for input(s): TSH, T4TOTAL, T3FREE, THYROIDAB in the last 72 hours.  Invalid input(s): FREET3  Anemia Panel: No results for input(s): VITAMINB12, FOLATE, FERRITIN, TIBC, IRON , RETICCTPCT in the last 72 hours.   Radiology: ECHOCARDIOGRAM COMPLETE Result Date: 05/18/2024    ECHOCARDIOGRAM REPORT   Patient Name:   Jerome Bartoszek. Date of Exam: 05/18/2024 Medical Rec #:  [id number]               Height:       69.0 in Accession #:    [id number]              Weight:       194.0 lb Date of Birth:  08-14-1934               BSA:          2.039 m Patient Age:    89 years                BP:           113/59 mmHg Patient Gender: M                       HR:           54 bpm. Exam Location:  [facility] Procedure: 2D Echo, Cardiac Doppler and Color Doppler (Both Spectral and Color            Flow Doppler were utilized during procedure). Indications:     I50.20* Unspecified systolic (congestive) heart failure  History:         Patient has prior history of Echocardiogram examinations, most                  recent 06/28/2022. CHF, CAD, Signs/Symptoms:Syncope; Risk                  Factors:Hypertension.  Sonographer:     Doyal Point MHA, BS, RDCS Referring Phys:  [id number] XILIN NIU Diagnosing Phys: Redell Cave MD  Sonographer Comments: Image acquisition  challenging due to patient body habitus and Image acquisition challenging due to uncooperative patient. IMPRESSIONS  1. Left ventricular ejection fraction, by estimation, is 50 to 55%. The left ventricle has low normal function. The left ventricle has no regional wall motion abnormalities. There is mild left ventricular hypertrophy. Left ventricular diastolic parameters are consistent with Grade II diastolic dysfunction (pseudonormalization).  2. Right ventricular systolic function is low normal. The right ventricular size is moderately enlarged. There is moderately elevated pulmonary artery systolic pressure. The estimated right ventricular systolic pressure is 48.2 mmHg.  3. Left atrial size was severely dilated.  4. Right atrial size was severely dilated.  5. Moderate pleural effusion in the left lateral region.  6. The mitral valve is normal in structure. Mild mitral valve regurgitation.  7. The aortic valve is tricuspid. Aortic valve regurgitation is not visualized. Aortic valve sclerosis/calcification is present, without any evidence of aortic stenosis.  8. Aortic dilatation noted. There is mild dilatation of the ascending aorta, measuring 40 mm.  9. The inferior vena cava is dilated in size with <50% respiratory variability, suggesting right atrial pressure of 15 mmHg. FINDINGS  Left Ventricle: Left ventricular ejection fraction, by estimation, is 50 to 55%. The left ventricle has low normal function. The left ventricle has no regional wall motion abnormalities. The left ventricular internal cavity size was normal in size. There  is mild left ventricular hypertrophy. Left ventricular diastolic parameters are consistent with Grade II diastolic dysfunction (pseudonormalization). Right Ventricle: The right ventricular size is moderately enlarged. No increase in right ventricular wall thickness. Right ventricular systolic function is low normal. There is moderately elevated pulmonary artery systolic pressure. The  tricuspid regurgitant velocity is 2.88 m/s, and with an assumed right atrial pressure of 15 mmHg, the estimated right ventricular systolic pressure is 48.2 mmHg. Left Atrium: Left atrial size was severely dilated. Right Atrium: Right atrial size was severely dilated. Pericardium: Trivial pericardial effusion is present. Mitral Valve: The mitral valve is normal in structure. Mild mitral valve regurgitation. Tricuspid Valve: The tricuspid valve is normal in structure. Tricuspid valve regurgitation is mild. Aortic Valve: The aortic valve is tricuspid. Aortic valve regurgitation is not visualized. Aortic valve sclerosis/calcification is present, without any evidence of aortic stenosis. Aortic valve mean gradient measures 3.0 mmHg. Aortic valve peak gradient measures 5.7 mmHg. Aortic valve area, by VTI measures 2.20 cm. Pulmonic Valve: The pulmonic valve was normal in structure. Pulmonic valve regurgitation is mild. Aorta: Aortic dilatation noted. There is mild dilatation of the ascending aorta, measuring 40 mm. Venous: The inferior vena cava is dilated in size with less than 50% respiratory variability, suggesting right atrial pressure of 15 mmHg. IAS/Shunts: No atrial level shunt detected by color flow Doppler. Additional Comments: There is a moderate pleural effusion in the left lateral region.  LEFT VENTRICLE PLAX 2D LVIDd:         4.20 cm   Diastology LVIDs:         3.10 cm   LV e' medial:    5.00 cm/s LV PW:         1.30 cm   LV E/e' medial:  17.2 LV IVS:        1.30 cm   LV e' lateral:   6.85 cm/s LVOT diam:     2.20 cm   LV E/e' lateral: 12.5 LV SV:         53 LV SV Index:   26 LVOT Area:     3.80 cm  RIGHT VENTRICLE RV Basal diam:  5.00 cm RV Mid diam:    4.30 cm RV S prime:     7.72 cm/s LEFT ATRIUM              Index        RIGHT ATRIUM           Index LA diam:        5.20 cm  2.55 cm/m   RA Area:     31.90 cm LA Vol (A2C):   90.4 ml  44.33 ml/m  RA Volume:   112.00 ml 54.92 ml/m LA Vol (A4C):   110.0 ml  53.94 ml/m LA Biplane Vol: 106.0 ml 51.97 ml/m  AORTIC VALVE AV Area (Vmax):    2.11 cm AV Area (Vmean):   1.99 cm AV Area (VTI):     2.20 cm AV Vmax:           119.00 cm/s AV Vmean:          84.100 cm/s AV VTI:            0.240 m AV Peak Grad:      5.7 mmHg AV Mean Grad:      3.0 mmHg LVOT Vmax:         66.00 cm/s LVOT Vmean:        44.000 cm/s LVOT VTI:  0.139 m LVOT/AV VTI ratio: 0.58  AORTA Ao Root diam: 3.50 cm Ao Asc diam:  4.00 cm MITRAL VALVE               TRICUSPID VALVE MV Area (PHT): 4.15 cm    TR Peak grad:   33.2 mmHg MV Decel Time: 183 msec    TR Vmax:        288.00 cm/s MV E velocity: 85.90 cm/s MV A velocity: 41.40 cm/s  SHUNTS MV E/A ratio:  2.07        Systemic VTI:  0.14 m                            Systemic Diam: 2.20 cm Redell Cave MD Electronically signed by Redell Cave MD Signature Date/Time: 05/18/2024/5:40:41 PM    Final    DG HIP UNILAT WITH PELVIS 2-3 VIEWS LEFT Result Date: 05/17/2024 CLINICAL DATA:  Pain EXAM: DG HIP (WITH OR WITHOUT PELVIS) 2-3V LEFT COMPARISON:  right hip and pelvis x-ray 01/21/2020 FINDINGS: The bones are diffusely osteopenic. There is no acute fracture or dislocation identified. There severe degenerative changes of the left hip with complete loss of joint space. There is mild sclerosis and mild osteophyte formation. This has progressed compared to 2021. Right hip arthroplasty is grossly within normal limits. Soft tissues are within normal limits. 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 Electronically Signed   By: Greig Pique M.D.   On: 05/17/2024 22:38   DG Foot Complete Left Result Date: 05/17/2024 CLINICAL DATA:  Heel pain EXAM: LEFT FOOT - COMPLETE 3+ VIEW COMPARISON:  None Available. FINDINGS: The bones are osteopenic. There is no acute fracture or focal osseous lesion. Joint spaces are maintained as can be seen. There is a small plantar calcaneal spur. Peripheral vascular  calcifications are present. There is soft tissue swelling of the anterior foot. IMPRESSION: 1. No acute fracture or dislocation. 2. Soft tissue swelling of the anterior foot. Electronically Signed   By: Greig Pique M.D.   On: 05/17/2024 22:36   CT HEAD WO CONTRAST Result Date: 05/17/2024 EXAM: CT HEAD WITHOUT CONTRAST 05/17/2024 05:42:41 PM TECHNIQUE: CT of the head was performed without the administration of intravenous contrast. Automated exposure control, iterative reconstruction, and/or weight based adjustment of the mA/kV was utilized to reduce the radiation dose to as low as reasonably achievable. COMPARISON: Prior head CT 06/26/2022. CLINICAL HISTORY: 89 year old male with acute neuro deficit, stroke suspected. FINDINGS: Mild motion artifact despite repeated imaging attempts. BRAIN AND VENTRICLES: Brain volume stable from last year and normal for age. No acute hemorrhage. No evidence of acute infarct. No hydrocephalus. No extra-axial collection. No mass effect or midline shift. Gray white differentiation stable and within normal limits for age. Mild motion artifact despite repeated imaging attempts. ORBITS: No acute abnormality. SINUSES: Visible paranasal sinuses, middle ears and mastoids remain clear. SOFT TISSUES AND SKULL: No acute soft tissue abnormality. No skull fracture. Mild for age calcified atherosclerosis at the skull base. IMPRESSION: 1. Mildly motion degraded despite repeated imaging attempts. 2. No acute intracranial abnormality. Electronically signed by: Helayne Hurst MD 05/17/2024 06:02 PM EST RP Workstation: [id number]    ECHO as above  TELEMETRY reviewed by me [facility]) 05/21/2024 : atrial fibrillation rate 50-60s  EKG reviewed by me: Afib SVR  Data reviewed by me [facility]) 05/21/2024: last 24h vitals tele labs imaging I/O provider notes  Principal Problem:   Atrial fibrillation  with slow ventricular response (HCC) Active Problems:   Acute exacerbation of CHF (congestive heart  failure) (HCC)   CAD (coronary artery disease)   HTN (hypertension)   Chronic diastolic CHF (congestive heart failure) (HCC)   Iron  deficiency anemia   UTI (urinary tract infection)   Hallucination   Chronic kidney disease, stage 3b (HCC)   Pain of left heel and left hip   BPH (benign prostatic hyperplasia)   Hypothermia   Depression    ASSESSMENT AND PLAN:   # Afib with slow ventricular response # Newly diagnosed atrial fibrillation # Acute heart failure preserved EF -Defer AV nodal blocking agents. Currently not requiring anything for rate control. -Eliquis dosing by pharmacy for Afib - currently on reduced dose due to Cr and age. -Continue PO lasix  40 mg daily. -Troponin mildly elevated, not consistent with ACS.  Cardiology will sign off. Please haiku with questions or re-engage if needed. Follow up with Dr. Custovic in 1-2 weeks.   This patient's plan of care was discussed and created with Dr. Florencio and he is in agreement.    Signed: Danita Bloch, PA-C 05/21/2024, 7:59 AM [facility]

## 2024-05-22 LAB — BASIC METABOLIC PANEL WITH GFR
Anion gap: 7 (ref 5–15)
BUN: 31 mg/dL — ABNORMAL HIGH (ref 8–23)
CO2: 32 mmol/L (ref 22–32)
Calcium: 8.3 mg/dL — ABNORMAL LOW (ref 8.9–10.3)
Chloride: 96 mmol/L — ABNORMAL LOW (ref 98–111)
Creatinine, Ser: 1.58 mg/dL — ABNORMAL HIGH (ref 0.61–1.24)
GFR, Estimated: 42 mL/min — ABNORMAL LOW (ref >60)
Glucose, Bld: 86 mg/dL (ref 70–99)
Potassium: 4 mmol/L (ref 3.5–5.1)
Sodium: 135 mmol/L (ref 135–145)

## 2024-05-22 MED ORDER — POLYETHYLENE GLYCOL 3350 17 G PO PACK
17.0000 g | PACK | Freq: Every day | ORAL | Status: DC
  Administered 2024-05-22 – 2024-05-23 (×2): 17 g via ORAL
  Filled 2024-05-22 (×2): qty 1

## 2024-05-22 MED ORDER — DICLOFENAC SODIUM 1 % EX GEL
2.0000 g | Freq: Four times a day (QID) | CUTANEOUS | Status: DC
  Administered 2024-05-22 – 2024-05-23 (×5): 2 g via TOPICAL
  Filled 2024-05-22: qty 100

## 2024-05-22 NOTE — Assessment & Plan Note (Signed)
 Stable cell count with hgb at 10.1

## 2024-05-22 NOTE — Assessment & Plan Note (Signed)
 Renal function with serum cr at 1,58 with K at 4,0 and serum bicarbonate at 32 Na 135  No need for daily blood work, hold on [employer] for tomorrow.

## 2024-05-22 NOTE — Progress Notes (Signed)
  Chaplain On-Call responded to Spiritual Care Consult Order from [place], NP. The request was to assist the patient in completing his HCPOA document.  Chaplain provided spiritual and emotional support for the patient and his Caregiver, Verneita Sharps.  Chaplain located [facility] [employer] [place]  and [place], and Employee Notary RN Harlene Hamilton, for their parts in completing the document.  Chaplain made a copy for the patient's Notebook Chart and returned the original to the patient.  Chaplain Bebe Ardean EMERSON Hershal., [facility]

## 2024-05-22 NOTE — Assessment & Plan Note (Addendum)
No signs of urinary retention.  Continue with tamsulosin.

## 2024-05-22 NOTE — Progress Notes (Signed)
  Progress Note   Patient: Jerome [name]. FMW:[id number] DOB: [birth year] DOA: 05/17/2024     5 DOS: the patient was seen and examined on 05/22/2024   Brief hospital course: [name] is an 89 year old male with history of hypertension, depression, anxiety, BPH, CAD, gout, presents to emergency department for chief concerns of increased urinary frequency, left heel pain, left hip pain, and hallucination.  11/29: Presented to the ED.  11/29: Patient admitted to hospital service for A-fib with slow ventricular response.  Patient also treated for UTI with Rocephin .  12/2: Assumed care of the patient.  Pending SNF.  IV Lasix  has been switched to p.o. today.  Assessment and Plan: * Atrial fibrillation with slow ventricular response [facility]) Patient with controlled atrial fibrillation.  Continue anticoagulation with apixaban.  Ok to discontinue telemetry .  Plan to follow up as outpatient.   CAD (coronary artery disease) No chest pain, no acute coronary syndrome.   Chronic diastolic CHF (congestive heart failure) (HCC) Echocardiogram with preserved LV systolic function with EF 50 to 55%, mild LVH, grade II diastolic dysfunction with pseudo normalization EA, RVSP 48,2 mmHg, severe dilatation RA and LA, trivial pericardial effusion,   Patient clinically euvolemic.  Limited medical therapy due to bradycardia, reduced GFR and risk of hypotension.  Continue furosemide  40 mg po daily.   HTN (hypertension) Continue blood pressure monitoring   Chronic kidney disease, stage 3b (HCC) Renal function with serum cr at 1,58 with K at 4,0 and serum bicarbonate at 32 Na 135  No need for daily blood work, hold on [employer] for tomorrow.   UTI (urinary tract infection) Patient completed antibiotic therapy with ceftriaxone  with good toleration   BPH (benign prostatic hyperplasia) No signs of urinary retention  Continue with tamsulosin    Iron  deficiency anemia Stable cell count with hgb at  10.1  Depression Venlafaxine 75 mg daily        Subjective: Patient with no chest pain and no dyspnea, no nausea or vomiting, today having left wrist pain, improved with analgesics   Physical Exam: Vitals:   05/21/24 2110 05/22/24 0350 05/22/24 0500 05/22/24 0800  BP: (!) 117/57 120/60  (!) 142/66  Pulse: 67 63  (!) 59  Resp: 19 17  20   Temp: 100 F (37.8 C) 98.8 F (37.1 C)  98.9 F (37.2 C)  TempSrc: Oral Oral    SpO2: 93% 94%  96%  Weight:   86.7 kg   Height:       Neurology awake and alert ENT with mild pallor with no icterus Cardiovascular with S1 and S2 present, irregular with no gallops, rubs or murmurs Respiratory with no rales or wheezing, no rhonchi  Abdomen with no distention No lower extremity edema   Data Reviewed:    Family Communication: no family at the bedside   Disposition: Status is: Inpatient Remains inpatient appropriate because: pending transfer to SNF   Planned Discharge Destination: Skilled nursing facility     Author: Elidia Toribio Furnace, MD 05/22/2024 11:50 AM  For on call review [email or web address].

## 2024-05-22 NOTE — Plan of Care (Signed)

## 2024-05-22 NOTE — Assessment & Plan Note (Signed)
No chest pain, no acute coronary syndrome.  

## 2024-05-22 NOTE — Progress Notes (Signed)
 Daily Progress Note   Patient Name: Jerome [name].       Date: 05/22/2024 DOB: [birth year]  Age: 89 y.o. MRN#: [id number] Attending Physician: Noralee Elidia Toribio DEWAINE Primary Care Physician: Diedra Lame, MD Admit Date: 05/17/2024  Reason for Consultation/Follow-up: Establishing goals of care  Subjective: PMT following for support and needs.  Friend is at bedside with patient.  They discussed that H POA documents have not been completed and are no longer at bedside.  H POA forms are not in chart.  Spoke with chaplain Ardean who states he will work to facilitate motorization of the document.  Length of Stay: 5  Current Medications: Scheduled Meds:   apixaban   2.5 mg Oral BID   diclofenac  Sodium  2 g Topical QID   furosemide   40 mg Oral Daily   polyethylene glycol  17 g Oral Daily   tamsulosin   0.4 mg Oral q1800   venlafaxine  XR  75 mg Oral Daily    Continuous Infusions:   PRN Meds: acetaminophen , hydrALAZINE , methocarbamol , ondansetron  (ZOFRAN ) IV  Physical Exam Pulmonary:     Effort: Pulmonary effort is normal.  Skin:    General: Skin is warm and dry.  Neurological:     Mental Status: He is alert.             Vital Signs: BP 117/67 (BP Location: Right Arm)   Pulse 67   Temp 99 F (37.2 C) (Oral)   Resp 18   Ht 5' 9 (1.753 m)   Wt 86.7 kg   SpO2 95%   BMI 28.23 kg/m  SpO2: SpO2: 95 % O2 Device: O2 Device: Room Air O2 Flow Rate:    Intake/output summary:  Intake/Output Summary (Last 24 hours) at 05/22/2024 1349 Last data filed at 05/22/2024 1340 Gross per 24 hour  Intake 340 ml  Output 3100 ml  Net -2760 ml   LBM: Last BM Date : 05/19/24 Baseline Weight: Weight: 72.8 kg (bed weight with only pillow and blanket) Most recent weight: Weight:  86.7 kg        Patient Active Problem List   Diagnosis Date Noted   Depression 05/20/2024   New onset atrial fibrillation (HCC) 05/17/2024   Myocardial injury 05/17/2024   UTI (urinary tract infection) 05/17/2024   Hallucination 05/17/2024   Chronic kidney disease,  stage 3b (HCC) 05/17/2024   Pain of left heel and left hip 05/17/2024   Atrial fibrillation with slow ventricular response (HCC) 05/17/2024   BPH (benign prostatic hyperplasia) 05/17/2024   Hypothermia 05/17/2024   Iron  deficiency anemia 06/30/2022   Pressure ulcer of left heel, unstageable (HCC) 06/29/2022   Chronic diastolic CHF (congestive heart failure) (HCC) 06/29/2022   Anemia of chronic disease 06/29/2022   Hyponatremia 06/28/2022   Pressure injury of skin 06/28/2022   Overweight 06/28/2022   Syncope due to orthostatic hypotension 06/28/2022   Syncope 06/26/2022   Hx of total hip arthroplasty, right 01/21/2020   Sepsis (HCC) 03/20/2019   CAD (coronary artery disease) 03/20/2019   HTN (hypertension) 03/20/2019   Acute on chronic renal failure 03/20/2019   Severe sepsis (HCC) 03/20/2019   Right leg pain 12/13/2018   Acute exacerbation of CHF (congestive heart failure) (HCC) 12/09/2018   Benign essential hypertension 01/12/2014   Gout 01/12/2014    Palliative Care Assessment & Plan   Recommendations/Plan: Spoke with chaplain Terrell.  He will attempt to facilitate motorization of H POA documents. Full code and full scope  Code Status:    Code Status Orders  (From admission, onward)           Start     Ordered   05/17/24 2054  Full code  Continuous       Question:  By:  Answer:  Consent: discussion documented in EHR   05/17/24 2055           Code Status History     Date Active Date Inactive Code Status Order ID Comments User Context   06/26/2022 1843 07/03/2022 1902 Full Code [id number]  Sherre Greig SAILOR, DO ED   01/21/2020 1411 01/24/2020 2233 Full Code [id number]  Mardee Lynwood SQUIBB, MD Inpatient    03/21/2019 0326 03/25/2019 1943 Full Code [id number]  Jenel Lenis, MD Inpatient   12/09/2018 0153 12/12/2018 2051 Full Code [id number]  Darl Jon DEL, NP ED      Camelia Lewis, NP  Please contact Palliative Medicine Team phone at [phone] for questions and concerns.

## 2024-05-23 MED ORDER — SMOG ENEMA
960.0000 mL | Freq: Once | RECTAL | Status: DC
  Filled 2024-05-23: qty 960

## 2024-05-23 MED ORDER — APIXABAN 2.5 MG PO TABS: 2.5000 mg | ORAL_TABLET | Freq: Two times a day (BID) | ORAL | Status: AC

## 2024-05-23 MED ORDER — FUROSEMIDE 40 MG PO TABS: 40.0000 mg | ORAL_TABLET | Freq: Every day | ORAL | Status: AC

## 2024-05-23 NOTE — TOC Transition Note (Signed)
 Transition of Care [facility]) - Discharge Note   Patient Details  Name: Jerome [name]. MRN: [id number] Date of Birth: 02-08-1935  Transition of [facility] CM/SW Contact:  Victory Jackquline RAMAN, RN Phone Number: 05/23/2024, 2:09 PM   Clinical Narrative:   Patient discharging to [facility] Rm# [id number]. Nurse to call report to [phone]. Transportation set up with [facility] and there are 3 people ahead of him. Bedside nurse made aware. RNCM called caretaker Verneita @ [phone] to inform her that the transporation was set up and he is going to Rm# [id number]. She verbalized understanding. No further concerns. RNCM signing off.    Final next level of care: Skilled Nursing Facility Barriers to Discharge: Barriers Resolved   Patient Goals and CMS Choice            Discharge Placement                Patient to be transferred to facility by: [facility] Name of family member notified: Verneita Patient and family notified of of transfer: 05/23/24  Discharge Plan and Services Additional resources added to the After Visit Summary for                                       Social Drivers of Health (SDOH) Interventions SDOH Screenings   Food Insecurity: No Food Insecurity (05/21/2024)  Housing: Low Risk  (05/21/2024)  Transportation Needs: No Transportation Needs (05/21/2024)  Utilities: Not At Risk (05/21/2024)  Financial Resource Strain: Patient Declined (05/01/2024)   Received from [facility]  Social Connections: Socially Isolated (05/21/2024)  Tobacco Use: Low Risk  (05/17/2024)     Readmission Risk Interventions     No data to display

## 2024-05-23 NOTE — Discharge Summary (Signed)
 Physician Discharge Summary   Vaughn Banks Dianne Bady.  male DOB: 1934-09-25  FMW:[id number]  PCP: Diedra Lame, MD  Admit date: 05/17/2024 Discharge date: 05/23/2024  Admitted From: home Disposition:  SNF rehab CODE STATUS: Full code  Discharge Instructions     Diet - low sodium heart healthy   Complete by: As directed    No wound care   Complete by: As directed       Hospital Course:  For full details, please see H&P, progress notes, consult notes and ancillary notes.  Briefly,  Mr. Budd Freiermuth is an 89 year old male with history of hypertension, depression, anxiety, BPH, CAD, arthritis, presented to emergency department for chief concerns of increased urinary frequency, left heel pain, left hip pain, and hallucination.   * New dx of Atrial fibrillation with slow ventricular response [facility]) Patient with controlled atrial fibrillation.  Cardio consulted.   --Home coreg  d/c'ed since HR low already --started on eliquis  --Follow up with Dr. Dewane in 1-2 weeks.    Acute on Chronic diastolic CHF (congestive heart failure) (HCC) Echocardiogram with preserved LV systolic function with EF 50 to 55%, mild LVH, grade II diastolic dysfunction. --Home coreg  d/c'ed since HR low already --received IV lasix  40 mg x4, and transitioned to oral lasix  40 mg daily. --Follow up with Dr. Custovic in 1-2 weeks.    CAD (coronary artery disease) No chest pain, no acute coronary syndrome.  --home ASA d/c'ed now that Eliquis  is added.  HTN (hypertension) --not taking amlodipine  PTA. --d/c'ed coreg  due to bradycardia    Chronic kidney disease, stage 3b (HCC)   UTI (urinary tract infection), ruled out --urine cx with multiple species present, likely chronic colonization.  BPH (benign prostatic hyperplasia) No signs of urinary retention  Continue with tamsulosin     Iron  deficiency anemia Stable cell count with hgb at 10.1.  Pt not taking iron  suppl PTA   Depression Venlafaxine   75 mg daily   Unless noted above, medications under STOP list are ones pt was not taking PTA.  Discharge Diagnoses:  Principal Problem:   Atrial fibrillation with slow ventricular response (HCC) Active Problems:   CAD (coronary artery disease)   Chronic diastolic CHF (congestive heart failure) (HCC)   HTN (hypertension)   Chronic kidney disease, stage 3b (HCC)   UTI (urinary tract infection)   BPH (benign prostatic hyperplasia)   Iron  deficiency anemia   Depression   30 Day Unplanned Readmission Risk Score    Flowsheet Row ED to Hosp-Admission (Current) from 05/17/2024 in [facility] (1A)  30 Day Unplanned Readmission Risk Score (%) 18.35 Filed at 05/23/2024 0802    This score is the patient's risk of an unplanned readmission within 30 days of being discharged (0 -100%). The score is based on dignosis, age, lab data, medications, orders, and past utilization.   Low:  0-14.9   Medium: 15-21.9   High: 22-29.9   Extreme: 30 and above         Discharge Instructions:  Allergies as of 05/23/2024       Reactions   Gabapentin  Other (See Comments)   hypotension        Medication List     STOP taking these medications    amLODipine  2.5 MG tablet Commonly known as: NORVASC    amLODipine  5 MG tablet Commonly known as: NORVASC    aspirin  EC 81 MG tablet   carvedilol  6.25 MG tablet Commonly known as: COREG    iron  polysaccharides 150 MG capsule Commonly  known as: NIFEREX       TAKE these medications    apixaban  2.5 MG Tabs tablet Commonly known as: ELIQUIS  Take 1 tablet (2.5 mg total) by mouth 2 (two) times daily.   [employer] Purelax 17 GM/SCOOP powder Generic drug: polyethylene glycol powder Take 17 g by mouth daily as needed for mild constipation. MIX 17 GRAMS IN 4-8 OUNCES OF FLUID & TAKE DAILY AS NEEDED FOR CONSTIPATION FOR UP TO 30 DAYS   cyanocobalamin  1000 MCG tablet Commonly known as: VITAMIN B12 Take 1,000 mcg by mouth  daily.   furosemide  40 MG tablet Commonly known as: LASIX  Take 1 tablet (40 mg total) by mouth daily. Start taking on: May 24, 2024   multivitamin with minerals tablet Take 1 tablet by mouth daily.   oxybutynin 5 MG tablet Commonly known as: DITROPAN Take 5 mg by mouth at bedtime.   tamsulosin  0.4 MG Caps capsule Commonly known as: FLOMAX  Take 0.4 mg by mouth daily at 6 PM.   venlafaxine  XR 75 MG 24 hr capsule Commonly known as: EFFEXOR -XR Take 75 mg by mouth daily.   vitamin C  with rose hips 500 MG tablet Take 500 mg by mouth in the morning and at bedtime.         Contact information for follow-up providers     Custovic, Sabina, DO. Go in 1 week(s).   Specialty: Cardiology Contact information: [address] [place] [address] [phone]              Contact information for after-discharge care     Destination     [facility] [place], [place]. [place]   Service: Skilled Nursing Contact information: [address] [place]  [address] [phone]                     Allergies  Allergen Reactions   Gabapentin  Other (See Comments)    hypotension     The results of significant diagnostics from this hospitalization (including imaging, microbiology, ancillary and laboratory) are listed below for reference.   Consultations:   Procedures/Studies: ECHOCARDIOGRAM COMPLETE Result Date: 05/18/2024    ECHOCARDIOGRAM REPORT   Patient Name:   Verner Mccrone. Date of Exam: 05/18/2024 Medical Rec #:  [id number]               Height:       69.0 in Accession #:    [id number]              Weight:       194.0 lb Date of Birth:  03/14/1935               BSA:          2.039 m Patient Age:    89 years                BP:           113/59 mmHg Patient Gender: M                       HR:           54 bpm. Exam Location:  [facility] Procedure: 2D Echo, Cardiac Doppler and Color Doppler (Both Spectral and Color            Flow Doppler were  utilized during procedure). Indications:     I50.20* Unspecified systolic (congestive) heart failure  History:         Patient has  prior history of Echocardiogram examinations, most                  recent 06/28/2022. CHF, CAD, Signs/Symptoms:Syncope; Risk                  Factors:Hypertension.  Sonographer:     Doyal Point MHA, BS, RDCS Referring Phys:  [id number] XILIN NIU Diagnosing Phys: Redell Cave MD  Sonographer Comments: Image acquisition challenging due to patient body habitus and Image acquisition challenging due to uncooperative patient. IMPRESSIONS  1. Left ventricular ejection fraction, by estimation, is 50 to 55%. The left ventricle has low normal function. The left ventricle has no regional wall motion abnormalities. There is mild left ventricular hypertrophy. Left ventricular diastolic parameters are consistent with Grade II diastolic dysfunction (pseudonormalization).  2. Right ventricular systolic function is low normal. The right ventricular size is moderately enlarged. There is moderately elevated pulmonary artery systolic pressure. The estimated right ventricular systolic pressure is 48.2 mmHg.  3. Left atrial size was severely dilated.  4. Right atrial size was severely dilated.  5. Moderate pleural effusion in the left lateral region.  6. The mitral valve is normal in structure. Mild mitral valve regurgitation.  7. The aortic valve is tricuspid. Aortic valve regurgitation is not visualized. Aortic valve sclerosis/calcification is present, without any evidence of aortic stenosis.  8. Aortic dilatation noted. There is mild dilatation of the ascending aorta, measuring 40 mm.  9. The inferior vena cava is dilated in size with <50% respiratory variability, suggesting right atrial pressure of 15 mmHg. FINDINGS  Left Ventricle: Left ventricular ejection fraction, by estimation, is 50 to 55%. The left ventricle has low normal function. The left ventricle has no regional wall motion abnormalities.  The left ventricular internal cavity size was normal in size. There is mild left ventricular hypertrophy. Left ventricular diastolic parameters are consistent with Grade II diastolic dysfunction (pseudonormalization). Right Ventricle: The right ventricular size is moderately enlarged. No increase in right ventricular wall thickness. Right ventricular systolic function is low normal. There is moderately elevated pulmonary artery systolic pressure. The tricuspid regurgitant velocity is 2.88 m/s, and with an assumed right atrial pressure of 15 mmHg, the estimated right ventricular systolic pressure is 48.2 mmHg. Left Atrium: Left atrial size was severely dilated. Right Atrium: Right atrial size was severely dilated. Pericardium: Trivial pericardial effusion is present. Mitral Valve: The mitral valve is normal in structure. Mild mitral valve regurgitation. Tricuspid Valve: The tricuspid valve is normal in structure. Tricuspid valve regurgitation is mild. Aortic Valve: The aortic valve is tricuspid. Aortic valve regurgitation is not visualized. Aortic valve sclerosis/calcification is present, without any evidence of aortic stenosis. Aortic valve mean gradient measures 3.0 mmHg. Aortic valve peak gradient measures 5.7 mmHg. Aortic valve area, by VTI measures 2.20 cm. Pulmonic Valve: The pulmonic valve was normal in structure. Pulmonic valve regurgitation is mild. Aorta: Aortic dilatation noted. There is mild dilatation of the ascending aorta, measuring 40 mm. Venous: The inferior vena cava is dilated in size with less than 50% respiratory variability, suggesting right atrial pressure of 15 mmHg. IAS/Shunts: No atrial level shunt detected by color flow Doppler. Additional Comments: There is a moderate pleural effusion in the left lateral region.  LEFT VENTRICLE PLAX 2D LVIDd:         4.20 cm   Diastology LVIDs:         3.10 cm   LV e' medial:    5.00 cm/s LV PW:  1.30 cm   LV E/e' medial:  17.2 LV IVS:        1.30  cm   LV e' lateral:   6.85 cm/s LVOT diam:     2.20 cm   LV E/e' lateral: 12.5 LV SV:         53 LV SV Index:   26 LVOT Area:     3.80 cm  RIGHT VENTRICLE RV Basal diam:  5.00 cm RV Mid diam:    4.30 cm RV S prime:     7.72 cm/s LEFT ATRIUM              Index        RIGHT ATRIUM           Index LA diam:        5.20 cm  2.55 cm/m   RA Area:     31.90 cm LA Vol (A2C):   90.4 ml  44.33 ml/m  RA Volume:   112.00 ml 54.92 ml/m LA Vol (A4C):   110.0 ml 53.94 ml/m LA Biplane Vol: 106.0 ml 51.97 ml/m  AORTIC VALVE AV Area (Vmax):    2.11 cm AV Area (Vmean):   1.99 cm AV Area (VTI):     2.20 cm AV Vmax:           119.00 cm/s AV Vmean:          84.100 cm/s AV VTI:            0.240 m AV Peak Grad:      5.7 mmHg AV Mean Grad:      3.0 mmHg LVOT Vmax:         66.00 cm/s LVOT Vmean:        44.000 cm/s LVOT VTI:          0.139 m LVOT/AV VTI ratio: 0.58  AORTA Ao Root diam: 3.50 cm Ao Asc diam:  4.00 cm MITRAL VALVE               TRICUSPID VALVE MV Area (PHT): 4.15 cm    TR Peak grad:   33.2 mmHg MV Decel Time: 183 msec    TR Vmax:        288.00 cm/s MV E velocity: 85.90 cm/s MV A velocity: 41.40 cm/s  SHUNTS MV E/A ratio:  2.07        Systemic VTI:  0.14 m                            Systemic Diam: 2.20 cm Redell Cave MD Electronically signed by Redell Cave MD Signature Date/Time: 05/18/2024/5:40:41 PM    Final    DG HIP UNILAT WITH PELVIS 2-3 VIEWS LEFT Result Date: 05/17/2024 CLINICAL DATA:  Pain EXAM: DG HIP (WITH OR WITHOUT PELVIS) 2-3V LEFT COMPARISON:  right hip and pelvis x-ray 01/21/2020 FINDINGS: The bones are diffusely osteopenic. There is no acute fracture or dislocation identified. There severe degenerative changes of the left hip with complete loss of joint space. There is mild sclerosis and mild osteophyte formation. This has progressed compared to 2021. Right hip arthroplasty is grossly within normal limits. Soft tissues are within normal limits. 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 Electronically Signed   By: Greig Pique M.D.   On: 05/17/2024 22:38   DG Foot Complete Left Result Date: 05/17/2024 CLINICAL DATA:  Heel pain EXAM: LEFT FOOT - COMPLETE 3+ VIEW  COMPARISON:  None Available. FINDINGS: The bones are osteopenic. There is no acute fracture or focal osseous lesion. Joint spaces are maintained as can be seen. There is a small plantar calcaneal spur. Peripheral vascular calcifications are present. There is soft tissue swelling of the anterior foot. IMPRESSION: 1. No acute fracture or dislocation. 2. Soft tissue swelling of the anterior foot. Electronically Signed   By: Greig Pique M.D.   On: 05/17/2024 22:36   CT HEAD WO CONTRAST Result Date: 05/17/2024 EXAM: CT HEAD WITHOUT CONTRAST 05/17/2024 05:42:41 PM TECHNIQUE: CT of the head was performed without the administration of intravenous contrast. Automated exposure control, iterative reconstruction, and/or weight based adjustment of the mA/kV was utilized to reduce the radiation dose to as low as reasonably achievable. COMPARISON: Prior head CT 06/26/2022. CLINICAL HISTORY: 89 year old male with acute neuro deficit, stroke suspected. FINDINGS: Mild motion artifact despite repeated imaging attempts. BRAIN AND VENTRICLES: Brain volume stable from last year and normal for age. No acute hemorrhage. No evidence of acute infarct. No hydrocephalus. No extra-axial collection. No mass effect or midline shift. Gray white differentiation stable and within normal limits for age. Mild motion artifact despite repeated imaging attempts. ORBITS: No acute abnormality. SINUSES: Visible paranasal sinuses, middle ears and mastoids remain clear. SOFT TISSUES AND SKULL: No acute soft tissue abnormality. No skull fracture. Mild for age calcified atherosclerosis at the skull base. IMPRESSION: 1. Mildly motion degraded despite repeated imaging attempts. 2. No acute intracranial abnormality. Electronically  signed by: Helayne Hurst MD 05/17/2024 06:02 PM EST RP Workstation: [id number]      Labs: BNP (last 3 results) No results for input(s): BNP in the last 8760 hours. Basic Metabolic Panel: Recent Labs  Lab 05/17/24 2055 05/18/24 0303 05/19/24 0424 05/20/24 0443 05/21/24 0416 05/22/24 0647  NA  --  140 139 136 136 135  K  --  4.3 4.4 4.0 4.1 4.0  CL  --  105 101 97* 96* 96*  CO2  --  25 29 31  32 32  GLUCOSE  --  64* 74 94 97 86  BUN  --  34* 34* 33* 33* 31*  CREATININE  --  1.57* 1.75* 1.84* 1.70* 1.58*  CALCIUM  --  8.7* 8.5* 8.5* 8.5* 8.3*  MG 2.2  --   --   --   --   --    Liver Function Tests: Recent Labs  Lab 05/17/24 1538  AST 23  ALT 13  ALKPHOS 87  BILITOT 0.5  PROT 7.5  ALBUMIN 3.1*   No results for input(s): LIPASE, AMYLASE in the last 168 hours. No results for input(s): AMMONIA in the last 168 hours. CBC: Recent Labs  Lab 05/17/24 1538 05/18/24 0303 05/21/24 0416  WBC 6.3 5.2 8.8  HGB 10.4* 9.2* 10.1*  HCT 33.1* 30.0* 31.4*  MCV 97.6 97.4 94.3  PLT 191 182 210   Cardiac Enzymes: Recent Labs  Lab 05/17/24 2054  CKTOTAL 48*   BNP: Invalid input(s): POCBNP CBG: Recent Labs  Lab 05/17/24 1931 05/18/24 0444 05/21/24 1151  GLUCAP 93 115* 116*   D-Dimer No results for input(s): DDIMER in the last 72 hours. Hgb A1c No results for input(s): HGBA1C in the last 72 hours. Lipid Profile No results for input(s): CHOL, HDL, LDLCALC, TRIG, CHOLHDL, LDLDIRECT in the last 72 hours. Thyroid function studies No results for input(s): TSH, T4TOTAL, T3FREE, THYROIDAB in the last 72 hours.  Invalid input(s): FREET3 Anemia work up No results for input(s): VITAMINB12, FOLATE, FERRITIN, TIBC, IRON ,  RETICCTPCT in the last 72 hours. Urinalysis    Component Value Date/Time   COLORURINE YELLOW (A) 05/17/2024 1539   APPEARANCEUR HAZY (A) 05/17/2024 1539   APPEARANCEUR Clear 02/20/2012 1420   LABSPEC 1.013  05/17/2024 1539   LABSPEC 1.013 02/20/2012 1420   PHURINE 5.0 05/17/2024 1539   GLUCOSEU NEGATIVE 05/17/2024 1539   GLUCOSEU Negative 02/20/2012 1420   HGBUR NEGATIVE 05/17/2024 1539   BILIRUBINUR NEGATIVE 05/17/2024 1539   BILIRUBINUR Negative 02/20/2012 1420   KETONESUR NEGATIVE 05/17/2024 1539   PROTEINUR NEGATIVE 05/17/2024 1539   NITRITE POSITIVE (A) 05/17/2024 1539   LEUKOCYTESUR LARGE (A) 05/17/2024 1539   LEUKOCYTESUR Negative 02/20/2012 1420   Sepsis Labs Recent Labs  Lab 05/17/24 1538 05/18/24 0303 05/21/24 0416  WBC 6.3 5.2 8.8   Microbiology Recent Results (from the past 240 hours)  Urine Culture (for pregnant, neutropenic or urologic patients or patients with an indwelling urinary catheter)     Status: Abnormal   Collection Time: 05/17/24  8:49 PM   Specimen: Urine, Random  Result Value Ref Range Status   Specimen Description   Final    URINE, RANDOM Performed at [facility], [address], [place], [place] [address]    Special Requests   Final    NONE Performed at [facility], [address] [place], [place] [address]    Culture MULTIPLE SPECIES PRESENT, SUGGEST RECOLLECTION (A)  Final   Report Status 05/19/2024 FINAL  Final     Total time spend on discharging this patient, including the last patient exam, discussing the hospital stay, instructions for ongoing care as it relates to all pertinent caregivers, as well as preparing the medical discharge records, prescriptions, and/or referrals as applicable, is 35 minutes.    Ellouise Haber, MD  [facility] 05/23/2024, 12:56 PM

## 2024-05-23 NOTE — TOC Progression Note (Signed)
 Transition of Care [facility]) - Progression Note    Patient Details  Name: Jerome [name]. MRN: [id number] Date of Birth: March 14, 1935  Transition of Care [facility]) CM/SW Contact  Jerome Jackquline RAMAN, RN Phone Number: 05/23/2024, 11:39 AM  Clinical Narrative:    11:03 am: [facility] received a message from the bedside nurse via secure chat informing me that the patient said he doesn't want to go to [facility]. He wants to go to [name]. SW added to the chat and informed the bedside nurse that [facility] declined the patient and the family chose [facility] for the patient.    11:25 am: [facility] called admissions coordinator at [facility] and she said that they do not have any beds for the patient today. Bedside nurse made aware.  11:47 am: RNCM spoke to patient @ [phone] and informed him that [facility] doesn't have a ed for him and he's okay with going to [facility] today and asked me to call his caregiver and let he know and see if she can pick him up.   11:54 am: RNCM Spoke to patient's caregiver Jerome [name] @ [phone], introduced role and explained that discharge planning would be discussed. RNCM informed caregiver that the patient was being discharged today to  [facility]. She said that she can't pick him up secondary to him being wheel chair bound. RNCM will continue to follow for discharge planning needs.      Expected Discharge Plan: Skilled Nursing Facility                 Expected Discharge Plan and Services       Living arrangements for the past 2 months: Single Family Home                                       Social Drivers of Health (SDOH) Interventions SDOH Screenings   Food Insecurity: No Food Insecurity (05/21/2024)  Housing: Low Risk  (05/21/2024)  Transportation Needs: No Transportation Needs (05/21/2024)  Utilities: Not At Risk (05/21/2024)  Financial Resource Strain: Patient Declined (05/01/2024)   Received from [facility]  Social  Connections: Socially Isolated (05/21/2024)  Tobacco Use: Low Risk  (05/17/2024)    Readmission Risk Interventions     No data to display

## 2024-05-23 NOTE — Progress Notes (Signed)
 Report given to Alecia at [facility].

## 2024-05-23 NOTE — Plan of Care (Signed)
   Problem: Education: Goal: Knowledge of General Education information will improve Description Including pain rating scale, medication(s)/side effects and non-pharmacologic comfort measures Outcome: Progressing
